# Patient Record
Sex: Male | Born: 1950 | Race: White | Hispanic: No | Marital: Single | Smoking: Light tobacco smoker
Health system: Southern US, Community
[De-identification: ages and names within clinical notes are randomized; demographics above are authoritative.]

## PROBLEM LIST (undated history)

## (undated) DIAGNOSIS — I639 Cerebral infarction, unspecified: Secondary | ICD-10-CM

## (undated) DIAGNOSIS — R42 Dizziness and giddiness: Secondary | ICD-10-CM

## (undated) DIAGNOSIS — I714 Abdominal aortic aneurysm, without rupture, unspecified: Secondary | ICD-10-CM

## (undated) DIAGNOSIS — G459 Transient cerebral ischemic attack, unspecified: Secondary | ICD-10-CM

---

## 2002-04-03 ENCOUNTER — Emergency Department (HOSPITAL_COMMUNITY): Admission: EM | Admit: 2002-04-03 | Discharge: 2002-04-03 | Payer: Self-pay | Admitting: Emergency Medicine

## 2002-04-03 ENCOUNTER — Encounter: Payer: Self-pay | Admitting: Internal Medicine

## 2002-04-03 ENCOUNTER — Encounter: Payer: Self-pay | Admitting: Emergency Medicine

## 2002-04-04 ENCOUNTER — Inpatient Hospital Stay (HOSPITAL_COMMUNITY): Admission: EM | Admit: 2002-04-04 | Discharge: 2002-04-05 | Payer: Self-pay | Admitting: Emergency Medicine

## 2002-04-04 ENCOUNTER — Encounter: Payer: Self-pay | Admitting: Internal Medicine

## 2002-05-27 ENCOUNTER — Encounter: Admission: RE | Admit: 2002-05-27 | Discharge: 2002-06-18 | Payer: Self-pay | Admitting: Internal Medicine

## 2017-08-23 ENCOUNTER — Other Ambulatory Visit: Payer: Self-pay

## 2017-08-23 NOTE — Patient Outreach (Signed)
[facility]) Care Management  08/23/2017  Isaac KaufmannBobby R Stokes 1951-02-03 [id number]   Telephone Screen  Referral Date: 08/23/17 Referral Source: Episource-HTA Referral Reason: "smoking, lack of knowledge about conditions, care coordination needed, member needs several referrals as he has not seen a doctor in years"  Insurance: HTA   Outreach attempt # 1 to patient. Main number listed for patient a male answered and reported wrong number and wished for number to be taken off contact info. He voices that he gets a lot of calls asking for patient and he does not know who patient is. RN CM attempted call to alternate number listed and no answer. RN CM left HIPAA compliant voicemail message along with contact info.      Plan: RN CM will notify [facility] administrative [facility] to update demographics. RN CM will make outreach attempt to patient within three business days if no return call.    Antionette Fairyoshanda Yoali Conry, RN,BSN,CCM [facility] Care [facility] Telephonic Care Management Coordinator Direct Phone: [phone] Toll Free: [phone] Fax: [phone]

## 2017-08-25 ENCOUNTER — Other Ambulatory Visit: Payer: Self-pay

## 2017-08-25 NOTE — Patient Outreach (Signed)
[facility]) Care Management  08/25/2017  Efraim KaufmannBobby R Closson [birth year] [id number]   Telephone Screen  Referral Date: 08/23/17 Referral Source: Episource-HTA Referral Reason: "smoking, lack of knowledge about conditions, care coordination needed, member needs several referrals as he has not seen a doctor in years"  Insurance: HTA   Outreach attempt #2 to patient. No answer at present.    Plan: RN CM will make outreach attempt to patient within three business days.   Antionette Fairyoshanda Derrica Sieg, RN,BSN,CCM [facility] Care Management Telephonic Care Management Coordinator Direct Phone: [phone] Toll Free: [phone] Fax: [phone]

## 2017-08-28 ENCOUNTER — Other Ambulatory Visit: Payer: Self-pay

## 2017-08-28 NOTE — Patient Outreach (Signed)
[facility]) Care Management  08/28/2017  Isaac KaufmannBobby R Stokes November 23, 1950 [id number]   Telephone Screen  Referral Date:08/23/17 Referral Source:Episource-HTA Referral Reason:"smoking, lack of knowledge about conditions, care coordination needed, member needs several referrals as he has not seen a doctor in years" Insurance:HTA  Outreach attempt # 3 to patient. Main number remains out of services. RN CM attempted alternate and no answer at present and voicemail message left.       Plan: RN CM will send unsuccessful outreach letter to patient and close case if no response within 10 business days.    Antionette Fairyoshanda Gradie Ohm, RN,BSN,CCM [facility] Care [facility] Telephonic Care Management Coordinator Direct Phone: [phone] Toll Free: [phone] Fax: [phone]

## 2017-08-31 ENCOUNTER — Other Ambulatory Visit: Payer: Self-pay

## 2017-08-31 NOTE — Patient Outreach (Signed)
[facility]) Care Management  08/31/2017  Isaac KaufmannBobby R Stokes 20-Sep-1950 [id number]   Telephone Screen  Referral Date:08/23/17 Referral Source:Episource-HTA Referral Reason:"smoking, lack of knowledge about conditions, care coordination needed, member needs several referrals as he has not seen a doctor in years" Insurance:HTA   Voicemail message received from [name](apartment complex coordinator where patient resides) calling for patient and requesting return call to patient. RN CM placed return call. No answer at present. RN CM left HIPAA compliant voicemail message along with contact info and advised Kerry FortVickey that due to HIPAA reasons RN CM unable to disclose any info to her without verbal consent from pt.     Plan: RN CM will await return call from patient. If no response will close case as unsuccessful letter already sent to patient.    Antionette Fairyoshanda Rex Oesterle, RN,BSN,CCM [facility] Care Management Telephonic Care Management Coordinator Direct Phone: [phone] Toll Free: [phone] Fax: [phone]

## 2017-09-01 ENCOUNTER — Other Ambulatory Visit: Payer: Self-pay

## 2017-09-01 NOTE — Patient Outreach (Signed)
[facility]) Care Management  09/01/2017  Isaac KaufmannBobby R Stokes [birth year] [id number]   Telephone Screen  Referral Date:08/23/17 Referral Source:Episource-HTA Referral Reason:"smoking, lack of knowledge about conditions, care coordination needed, member needs several referrals as he has not seen a doctor in years" Insurance:HTA   Voicemail message received from pt/ apartment coordinator-Isaac Stokes. She voices that patient was currently in the room with her and able to complete screening call with RNCM. Return call placed and no answer at present.     Plan: RN CM has already made more than three outreach attempts to establish contact with patient and sent unable to reach letter. Case closure pending.   Antionette Fairyoshanda Lakina Mcintire, RN,BSN,CCM [facility] Care Management Telephonic Care Management Coordinator Direct Phone: [phone] Toll Free: [phone] Fax: [phone]

## 2017-09-11 ENCOUNTER — Other Ambulatory Visit: Payer: Self-pay

## 2017-09-11 NOTE — Patient Outreach (Signed)
[facility]) Care Management  09/11/2017  Efraim KaufmannBobby R Harwick [birth year] [id number]   Telephone Screen  Referral Date:08/23/17 Referral Source:Episource-HTA Referral Reason:"smoking, lack of knowledge about conditions, care coordination needed, member needs several referrals as he has not seen a doctor in years" Insurance:HTA     Multiple attempts to establish contact with patient without success. No response from letter mailed to patient. Case is being closed at this time.    Plan: RN CM will notify [facility] administrative assistant of case status. RN CM unable to send MD letter as no PCP listed on file.    Antionette Fairyoshanda Madelynn Malson, RN,BSN,CCM [facility] Care Management Telephonic Care Management Coordinator Direct Phone: [phone] Toll Free: [phone] Fax: [phone]

## 2019-03-27 ENCOUNTER — Other Ambulatory Visit: Payer: Self-pay

## 2019-07-29 ENCOUNTER — Other Ambulatory Visit: Payer: Self-pay

## 2019-07-29 DIAGNOSIS — Z20822 Contact with and (suspected) exposure to covid-19: Secondary | ICD-10-CM

## 2019-07-30 LAB — NOVEL CORONAVIRUS, NAA: SARS-CoV-2, NAA: NOT DETECTED

## 2019-08-08 DIAGNOSIS — J301 Allergic rhinitis due to pollen: Secondary | ICD-10-CM | POA: Diagnosis not present

## 2019-08-08 DIAGNOSIS — F338 Other recurrent depressive disorders: Secondary | ICD-10-CM | POA: Diagnosis not present

## 2020-12-15 ENCOUNTER — Other Ambulatory Visit: Payer: Self-pay

## 2020-12-15 ENCOUNTER — Ambulatory Visit (INDEPENDENT_AMBULATORY_CARE_PROVIDER_SITE_OTHER): Payer: Medicare Other | Admitting: Podiatry

## 2020-12-15 ENCOUNTER — Ambulatory Visit (INDEPENDENT_AMBULATORY_CARE_PROVIDER_SITE_OTHER): Payer: Medicare Other

## 2020-12-15 DIAGNOSIS — M21611 Bunion of right foot: Secondary | ICD-10-CM | POA: Diagnosis not present

## 2020-12-15 DIAGNOSIS — M21619 Bunion of unspecified foot: Secondary | ICD-10-CM

## 2020-12-16 ENCOUNTER — Telehealth: Payer: Self-pay | Admitting: *Deleted

## 2020-12-16 NOTE — Telephone Encounter (Signed)
Patient is calling for the status of a medication that was suppose to be sent to pharmacy [facility]), is there waiting but they do not have. Notes not available. Please advise.

## 2020-12-17 MED ORDER — CICLOPIROX 8 % EX SOLN: Freq: Every day | CUTANEOUS | 2 refills | Status: DC

## 2020-12-17 NOTE — Telephone Encounter (Signed)
Sent Penlac

## 2020-12-17 NOTE — Telephone Encounter (Signed)
Informed patient that prescription had been sent to pharmacy on file,verbalized understanding.

## 2020-12-17 NOTE — Telephone Encounter (Signed)
Returned call to inform that prescription had been sent to pharmacy, no answer and could not leave message.

## 2020-12-20 NOTE — Progress Notes (Signed)
Subjective:   Patient ID: Isaac Stokes, male   DOB: 70 y.o.   MRN: [id number]   HPI 70 year old male presents the office today with concerns of a bunion as well as thick, discolored toenails that he cannot trim himself and is asking for treatment options for nail fungus.  He states that he has had a bunion for greater than 40 years he was told previously the bunion cannot be removed and also got infected.  Causes minimal discomfort.  He said no recent treatment for either issue.  He has no other concerns today.  Review of Systems  All other systems reviewed and are negative.  No past medical history on file.    Current Outpatient Medications:  .  ciclopirox (PENLAC) 8 % solution, Apply topically at bedtime. Apply over nail and surrounding skin. Apply daily over previous coat. After seven (7) days, may remove with alcohol and continue cycle., Disp: 6.6 mL, Rfl: 2  Not on File        Objective:  Physical Exam  General: AAO x3, NAD  Dermatological: Nails are hypertrophic, dystrophic, brittle, discolored, elongated 10. No surrounding redness or drainage. Tenderness nails 1-5 bilaterally. No open lesions or pre-ulcerative lesions are identified today.  Vascular: Dorsalis Pedis artery and Posterior Tibial artery pedal pulses are 2/4 bilateral with immedate capillary fill time. There is no pain with calf compression, swelling, warmth, erythema.   Neruologic: Grossly intact via light touch bilateral.    Musculoskeletal: Significant bunion is present on the right side.  Mild erythema more irritating.  She has been there is no skin breakdown, warmth or any signs of infection.  No pain with MPJ range of motion.  No significant tenderness palpation on the bunion area.  Muscular strength 5/5 in all groups tested bilateral.  Gait: Unassisted, Nonantalgic.       Assessment:   70 year old male with bunion, symptomatic onychomycosis    Plan:  -Treatment options discussed including all  alternatives, risks, and complications -Etiology of symptoms were discussed  1.  Bunion -X-rays obtained and reviewed.  No evidence of acute fracture.  Significant bunion is present.  Arthritic changes present the first MPJ. -Discussed with conservative as well as surgical options.  Recommended continue with conservative treatment.  Discussed offloading pads which were dispensed today as well as shoe modifications.  2.  Symptomatic onychomycosis -Sharply debrided the nails x10 without any complications or bleeding -Penlac  Return for nail removal in 3-4 weeks and wound check .  Vivi Barrack DPM

## 2021-01-26 ENCOUNTER — Emergency Department (HOSPITAL_COMMUNITY): Payer: Medicare Other

## 2021-01-26 ENCOUNTER — Inpatient Hospital Stay (HOSPITAL_COMMUNITY)
Admission: EM | Admit: 2021-01-26 | Discharge: 2021-01-28 | DRG: 065 | Disposition: A | Discharge status: Home Health | Payer: Medicare Other | Attending: Family Medicine | Admitting: Family Medicine

## 2021-01-26 ENCOUNTER — Encounter (HOSPITAL_COMMUNITY): Payer: Self-pay | Admitting: Emergency Medicine

## 2021-01-26 ENCOUNTER — Other Ambulatory Visit: Payer: Self-pay

## 2021-01-26 DIAGNOSIS — R29704 NIHSS score 4: Secondary | ICD-10-CM | POA: Diagnosis present

## 2021-01-26 DIAGNOSIS — R270 Ataxia, unspecified: Secondary | ICD-10-CM | POA: Diagnosis present

## 2021-01-26 DIAGNOSIS — G8194 Hemiplegia, unspecified affecting left nondominant side: Secondary | ICD-10-CM | POA: Diagnosis present

## 2021-01-26 DIAGNOSIS — Z720 Tobacco use: Secondary | ICD-10-CM

## 2021-01-26 DIAGNOSIS — Z8041 Family history of malignant neoplasm of ovary: Secondary | ICD-10-CM | POA: Diagnosis not present

## 2021-01-26 DIAGNOSIS — Z20822 Contact with and (suspected) exposure to covid-19: Secondary | ICD-10-CM | POA: Diagnosis present

## 2021-01-26 DIAGNOSIS — R631 Polydipsia: Secondary | ICD-10-CM | POA: Diagnosis present

## 2021-01-26 DIAGNOSIS — Z823 Family history of stroke: Secondary | ICD-10-CM | POA: Diagnosis not present

## 2021-01-26 DIAGNOSIS — Z7982 Long term (current) use of aspirin: Secondary | ICD-10-CM

## 2021-01-26 DIAGNOSIS — I6389 Other cerebral infarction: Secondary | ICD-10-CM | POA: Diagnosis not present

## 2021-01-26 DIAGNOSIS — Z8673 Personal history of transient ischemic attack (TIA), and cerebral infarction without residual deficits: Secondary | ICD-10-CM | POA: Diagnosis not present

## 2021-01-26 DIAGNOSIS — I639 Cerebral infarction, unspecified: Secondary | ICD-10-CM | POA: Diagnosis not present

## 2021-01-26 DIAGNOSIS — F17210 Nicotine dependence, cigarettes, uncomplicated: Secondary | ICD-10-CM | POA: Diagnosis present

## 2021-01-26 DIAGNOSIS — R3589 Other polyuria: Secondary | ICD-10-CM

## 2021-01-26 DIAGNOSIS — R202 Paresthesia of skin: Secondary | ICD-10-CM | POA: Diagnosis not present

## 2021-01-26 DIAGNOSIS — I6381 Other cerebral infarction due to occlusion or stenosis of small artery: Secondary | ICD-10-CM

## 2021-01-26 DIAGNOSIS — N289 Disorder of kidney and ureter, unspecified: Secondary | ICD-10-CM | POA: Diagnosis present

## 2021-01-26 DIAGNOSIS — R079 Chest pain, unspecified: Secondary | ICD-10-CM

## 2021-01-26 HISTORY — DX: Transient cerebral ischemic attack, unspecified: G45.9

## 2021-01-26 LAB — DIFFERENTIAL
Abs Immature Granulocytes: 0.04 10*3/uL (ref 0.00–0.07)
Basophils Absolute: 0.1 10*3/uL (ref 0.0–0.1)
Basophils Relative: 1 %
Eosinophils Absolute: 0.1 10*3/uL (ref 0.0–0.5)
Eosinophils Relative: 2 %
Immature Granulocytes: 1 %
Lymphocytes Relative: 25 %
Lymphs Abs: 1.7 10*3/uL (ref 0.7–4.0)
Monocytes Absolute: 0.5 10*3/uL (ref 0.1–1.0)
Monocytes Relative: 7 %
Neutro Abs: 4.5 10*3/uL (ref 1.7–7.7)
Neutrophils Relative %: 64 %

## 2021-01-26 LAB — CBC
HCT: 40.4 % (ref 39.0–52.0)
Hemoglobin: 12.8 g/dL — ABNORMAL LOW (ref 13.0–17.0)
MCH: 30.5 pg (ref 26.0–34.0)
MCHC: 31.7 g/dL (ref 30.0–36.0)
MCV: 96.4 fL (ref 80.0–100.0)
Platelets: 239 10*3/uL (ref 150–400)
RBC: 4.19 MIL/uL — ABNORMAL LOW (ref 4.22–5.81)
RDW: 13.4 % (ref 11.5–15.5)
WBC: 6.9 10*3/uL (ref 4.0–10.5)
nRBC: 0 % (ref 0.0–0.2)

## 2021-01-26 LAB — COMPREHENSIVE METABOLIC PANEL
ALT: 15 U/L (ref 0–44)
AST: 20 U/L (ref 15–41)
Albumin: 3.8 g/dL (ref 3.5–5.0)
Alkaline Phosphatase: 45 U/L (ref 38–126)
Anion gap: 9 (ref 5–15)
BUN: 28 mg/dL — ABNORMAL HIGH (ref 8–23)
CO2: 22 mmol/L (ref 22–32)
Calcium: 9 mg/dL (ref 8.9–10.3)
Chloride: 107 mmol/L (ref 98–111)
Creatinine, Ser: 1.7 mg/dL — ABNORMAL HIGH (ref 0.61–1.24)
GFR, Estimated: 43 mL/min — ABNORMAL LOW (ref >60)
Glucose, Bld: 105 mg/dL — ABNORMAL HIGH (ref 70–99)
Potassium: 4.2 mmol/L (ref 3.5–5.1)
Sodium: 138 mmol/L (ref 135–145)
Total Bilirubin: 0.5 mg/dL (ref 0.3–1.2)
Total Protein: 7.2 g/dL (ref 6.5–8.1)

## 2021-01-26 LAB — I-STAT CHEM 8, ED
BUN: 29 mg/dL — ABNORMAL HIGH (ref 8–23)
Calcium, Ion: 1.22 mmol/L (ref 1.15–1.40)
Chloride: 109 mmol/L (ref 98–111)
Creatinine, Ser: 1.7 mg/dL — ABNORMAL HIGH (ref 0.61–1.24)
Glucose, Bld: 102 mg/dL — ABNORMAL HIGH (ref 70–99)
HCT: 38 % — ABNORMAL LOW (ref 39.0–52.0)
Hemoglobin: 12.9 g/dL — ABNORMAL LOW (ref 13.0–17.0)
Potassium: 4.2 mmol/L (ref 3.5–5.1)
Sodium: 140 mmol/L (ref 135–145)
TCO2: 22 mmol/L (ref 22–32)

## 2021-01-26 LAB — RESP PANEL BY RT-PCR (FLU A&B, COVID) ARPGX2
Influenza A by PCR: NEGATIVE
Influenza B by PCR: NEGATIVE
SARS Coronavirus 2 by RT PCR: NEGATIVE

## 2021-01-26 LAB — PROTIME-INR
INR: 1.1 (ref 0.8–1.2)
Prothrombin Time: 13.7 seconds (ref 11.4–15.2)

## 2021-01-26 LAB — CBG MONITORING, ED: Glucose-Capillary: 92 mg/dL (ref 70–99)

## 2021-01-26 LAB — APTT: aPTT: 30 seconds (ref 24–36)

## 2021-01-26 IMAGING — MR MR HEAD W/O CM
10 of 11 series · 43 of 48 positions shown · non-contrast
Comparison: No pertinent prior exam.
COMPARISON: No pertinent prior exam.

CLINICAL DATA: Left-sided tingling and numbness

EXAM:
MRI HEAD WITHOUT CONTRAST
MRA HEAD WITHOUT CONTRAST
TECHNIQUE: Multiplanar, multi-echo pulse sequences of the brain and surrounding
structures were acquired without intravenous contrast. Angiographic
images of the Circle of Willis were acquired using MRA technique
without intravenous contrast.

[Series 5: DWI · axial · 3.0mm · 0.88mm/px · z∈[-63,+86]mm · 9 of 108 slices shown (1 of 4)]
[im 1/108]
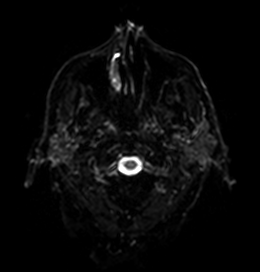
[im 14/108]
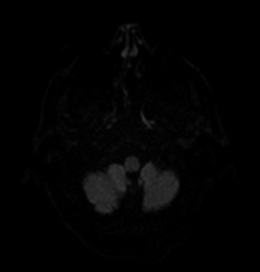
[im 27/108]
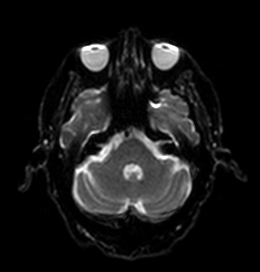
[im 41/108]
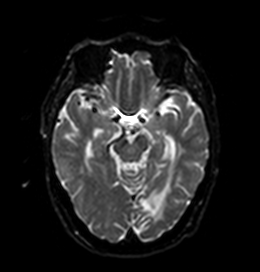
[im 54/108]
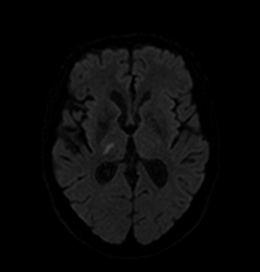
[im 67/108]
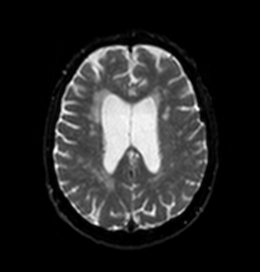
[im 81/108]
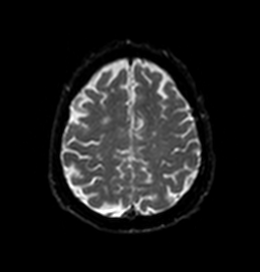
[im 94/108]
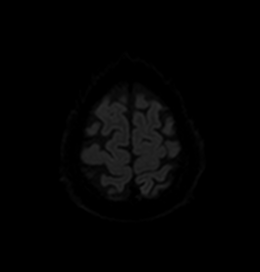
[im 108/108]
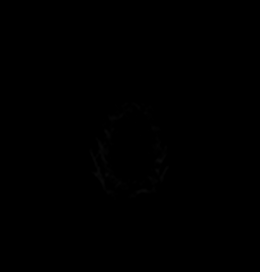

[Series 6: DWI · axial · 3.0mm · 0.88mm/px · z∈[-63,+86]mm · 5 of 54 slices shown (2 of 4)]
[im 1/54]
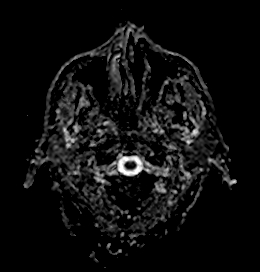
[im 14/54]
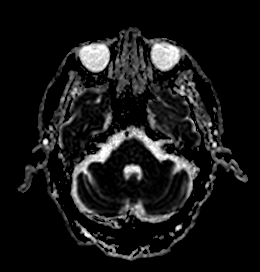
[im 27/54]
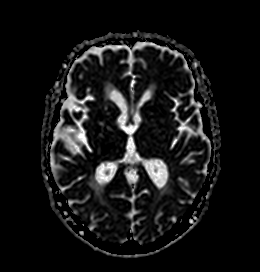
[im 40/54]
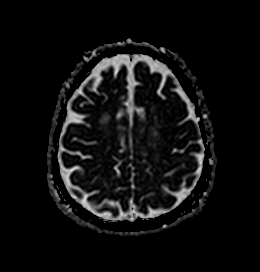
[im 54/54]
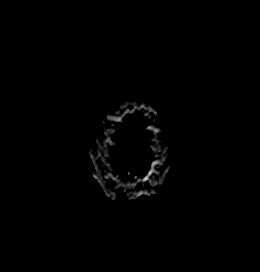

[Series 7: DWI · coronal · 4.0mm · 0.88mm/px · 7 of 76 slices shown (3 of 4)]
[im 1/76]
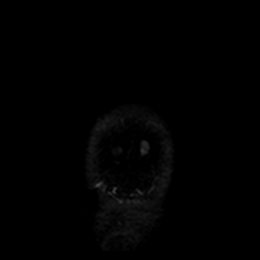
[im 13/76]
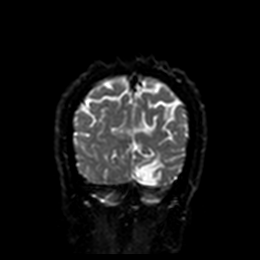
[im 26/76]
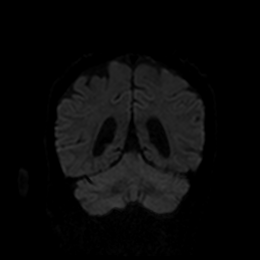
[im 38/76]
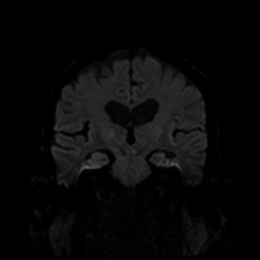
[im 51/76]
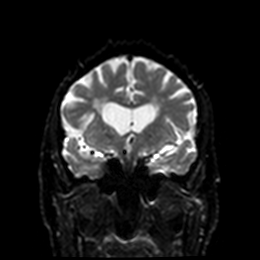
[im 63/76]
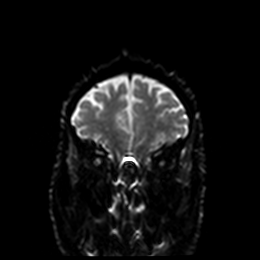
[im 76/76]
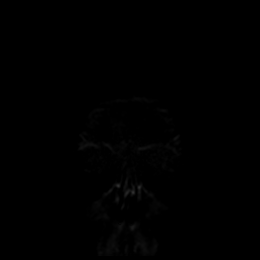

[Series 8: DWI · coronal · 4.0mm · 0.88mm/px · 3 of 38 slices shown (4 of 4)]
[im 1/38]
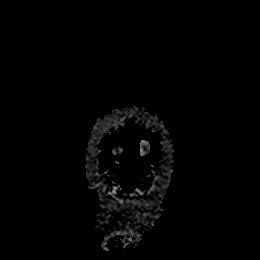
[im 19/38]
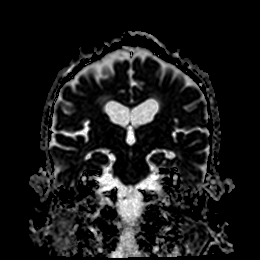
[im 38/38]
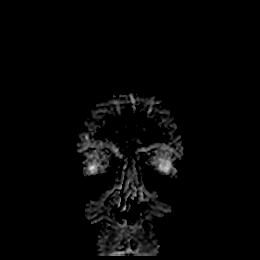

[Series 9: FLAIR · axial · 5.0mm · 0.47mm/px · z∈[-55,+85]mm · 2 of 26 slices shown]
[im 1/26]
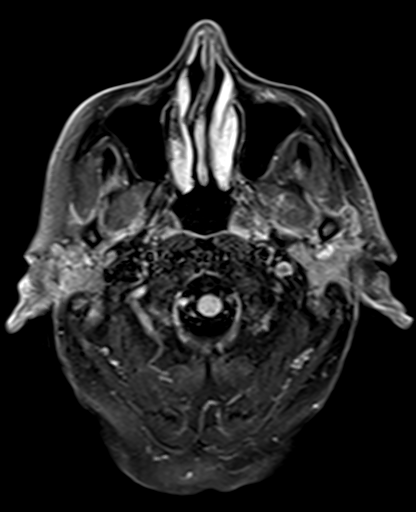
[im 26/26]
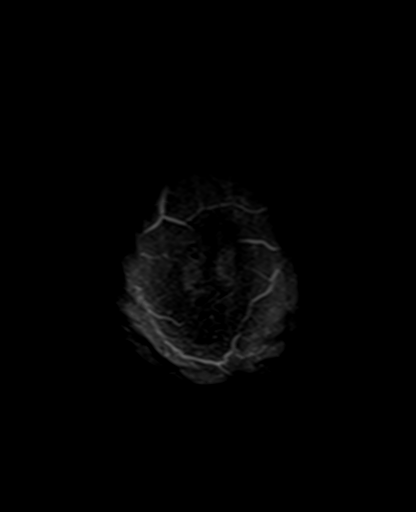

[Series 15: pha_images · axial · 3.0mm · 0.94mm/px · z∈[-63,+92]mm · 5 of 56 slices shown]
[im 1/56]
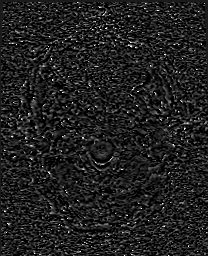
[im 14/56]
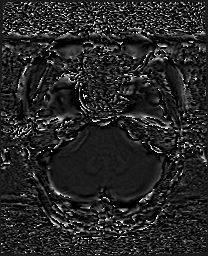
[im 28/56]
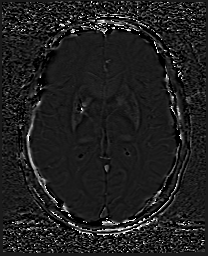
[im 42/56]
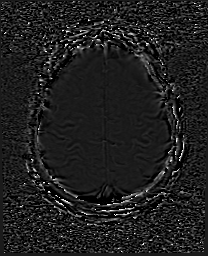
[im 56/56]
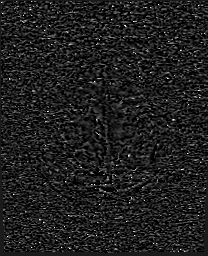

[Series 16: swi_images · axial · 3.0mm · 0.94mm/px · z∈[-63,+92]mm · 5 of 56 slices shown]
[im 1/56]
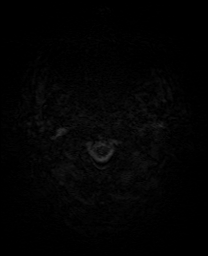
[im 14/56]
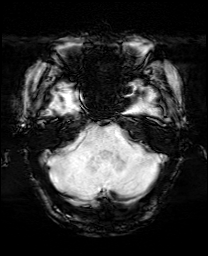
[im 28/56]
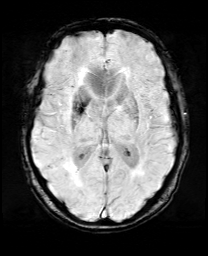
[im 42/56]
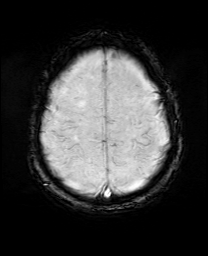
[im 56/56]
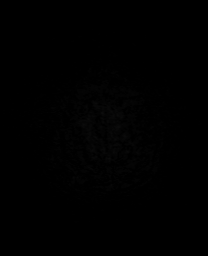

[Series 18: T1 · sagittal · 5.0mm · 0.75mm/px · 2 of 25 slices shown]
[im 1/25]
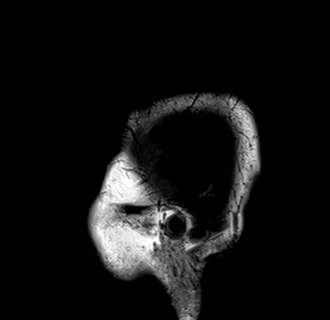
[im 25/25]
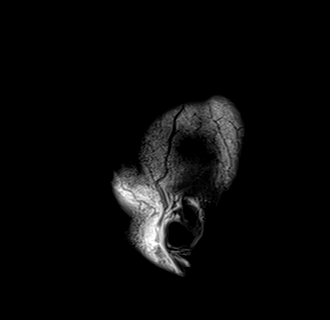

[Series 19: T2 · axial · 5.0mm · 0.75mm/px · z∈[-56,+84]mm · 2 of 26 slices shown (1 of 2)]
[im 1/26]
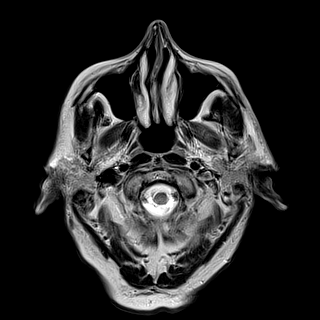
[im 26/26]
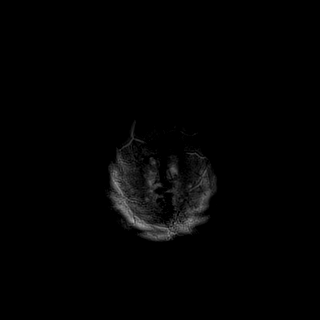

[Series 21: T2 · coronal · 5.0mm · 0.34mm/px · 3 of 32 slices shown (2 of 2)]
[im 1/32]
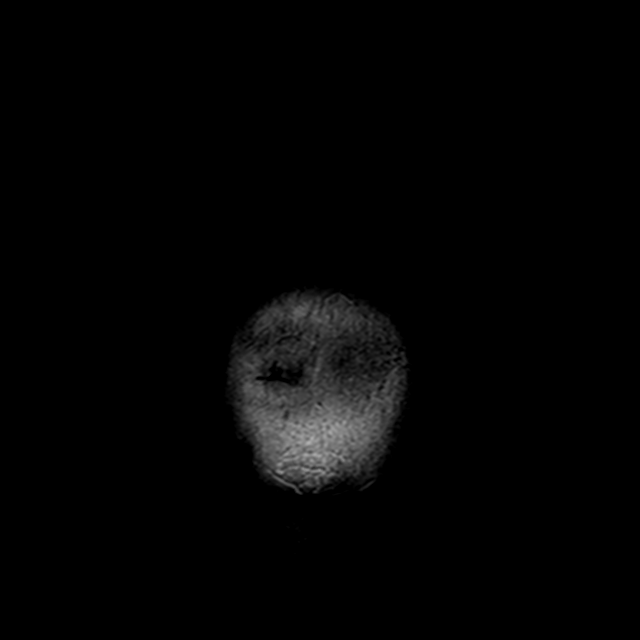
[im 16/32]
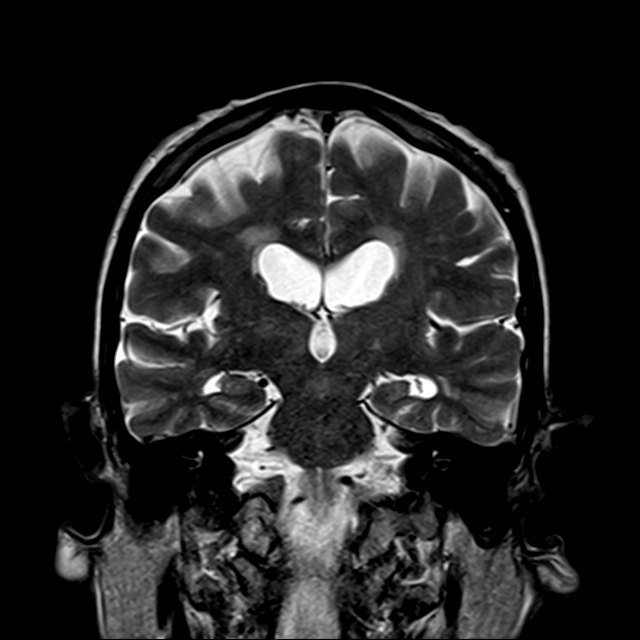
[im 32/32]
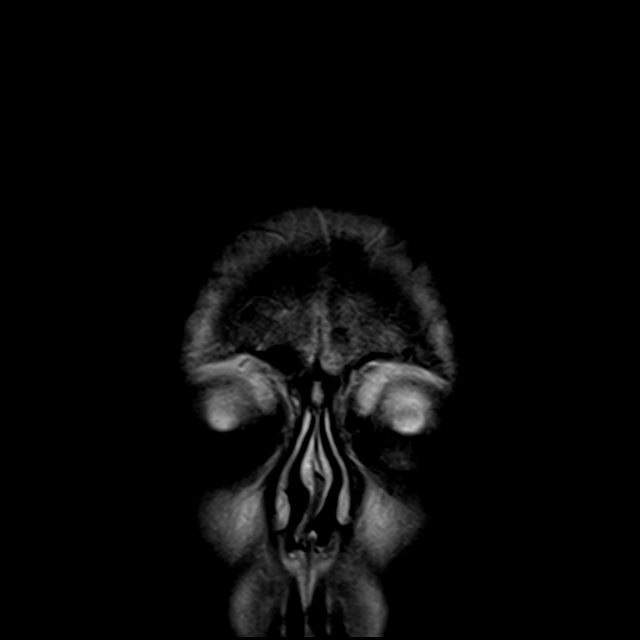

[43 of 48 positions shown; findings below may reference images not displayed]

FINDINGS: MRI HEAD FINDINGS

Brain: Small acute infarct of the lateral right thalamus. No acute
or chronic hemorrhage. There is multifocal hyperintense T2-weighted
signal within the white matter. Generalized volume loss without a
clear lobar predilection. Old left occipital infarct. The midline
structures are normal.

Vascular: Major flow voids are preserved.

Skull and upper cervical spine: Normal calvarium and skull base.
Visualized upper cervical spine and soft tissues are normal.

Sinuses/Orbits:No paranasal sinus fluid levels or advanced mucosal
thickening. No mastoid or middle ear effusion. Normal orbits.

MRA HEAD FINDINGS

POSTERIOR CIRCULATION:

--Vertebral arteries: Normal

--Inferior cerebellar arteries: Normal.

--Basilar artery: Normal.

--Superior cerebellar arteries: Normal.

--Posterior cerebral arteries: Left PCA occlusion at the P1-2
junction. Normal right PCA.

ANTERIOR CIRCULATION:

--Intracranial internal carotid arteries: Normal.

--Anterior cerebral arteries (ACA): Normal.

--Middle cerebral arteries (MCA): Normal.

ANATOMIC VARIANTS: None
IMPRESSION: 1. Small acute infarct of the lateral right thalamus. No hemorrhage
or mass effect.
2. Left PCA occlusion at the P1-2 junction with old left occipital
infarct.
3. Chronic small vessel ischemic changes and atrophy.

## 2021-01-26 IMAGING — CT CT HEAD W/O CM
4 series · 15 of 47 positions shown, 17 images · non-contrast
Comparison: None.

CLINICAL DATA: Weakness

EXAM:
CT HEAD WITHOUT CONTRAST
TECHNIQUE: Contiguous axial images were obtained from the base of the skull
through the vertex without intravenous contrast.

[Series 3: head without · axial · non-contrast · 0.45mm/px · z∈[-166,-26]mm · 7 of 38 slices shown, 9 images]
[im 5/38  brain]
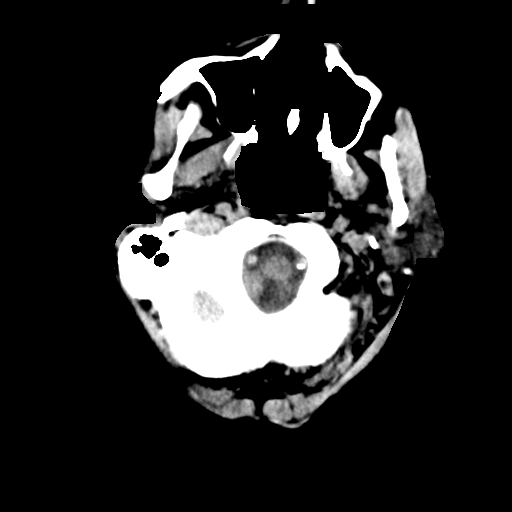
[im 5/38  bone]
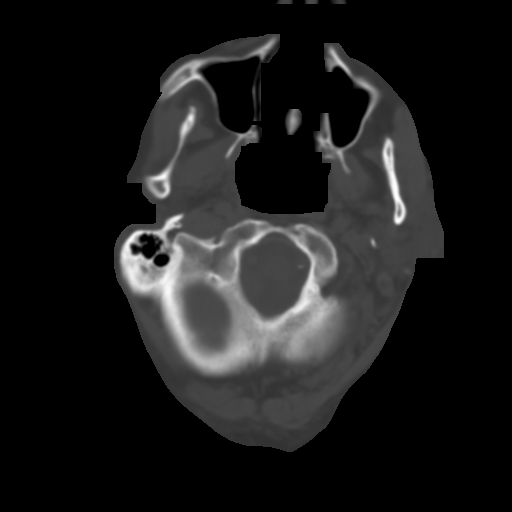
[im 10/38  brain]
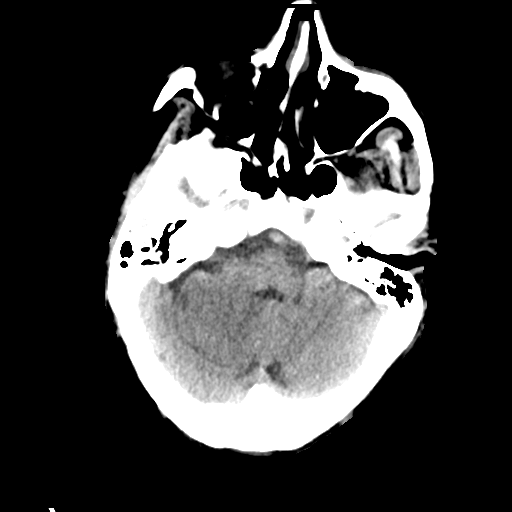
[im 14/38  brain]
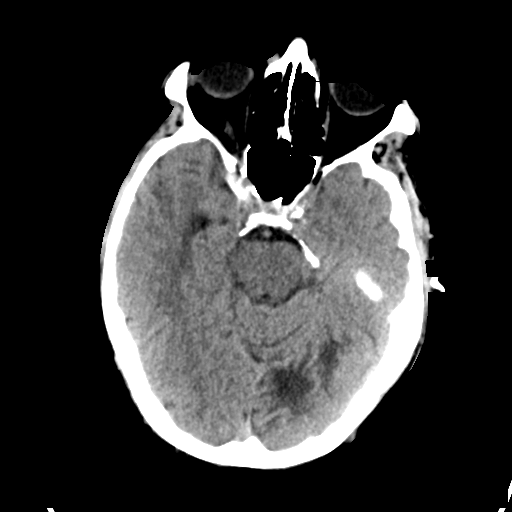
[im 19/38  brain]
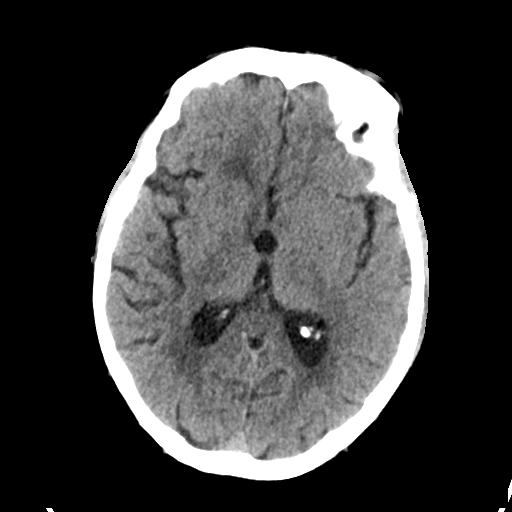
[im 24/38  brain]
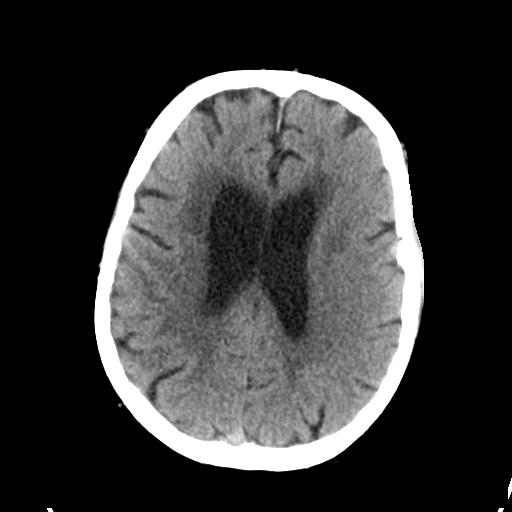
[im 24/38  bone]
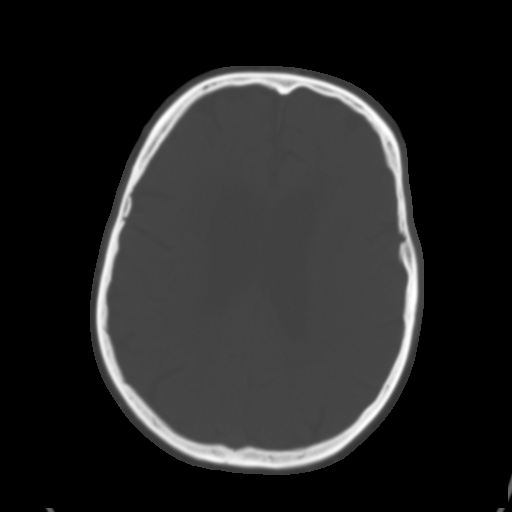
[im 28/38  brain]
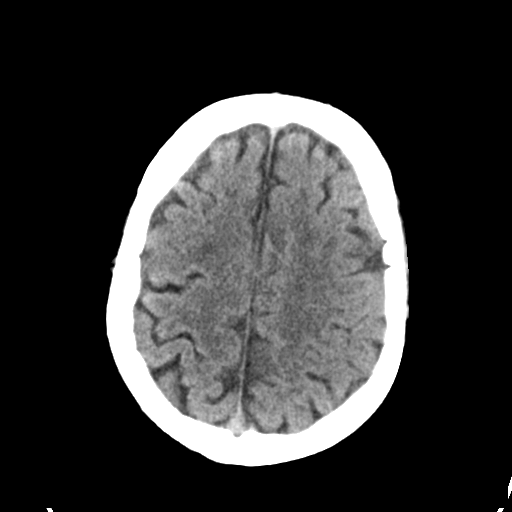
[im 33/38  brain]
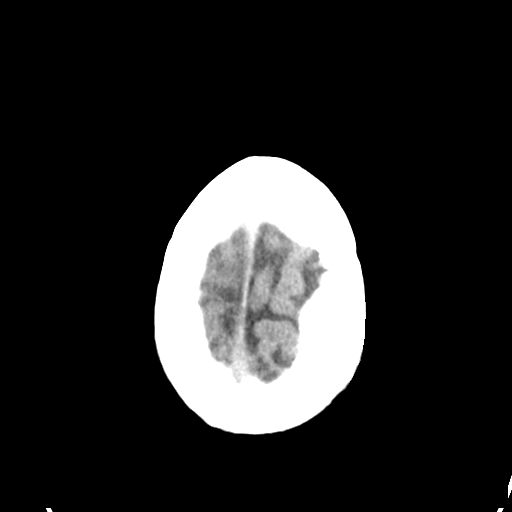

[Series 4: head bone · axial · 0.45mm/px · z∈[-168,-150]mm · 2 of 94 slices shown]
[im 10/94  bone]
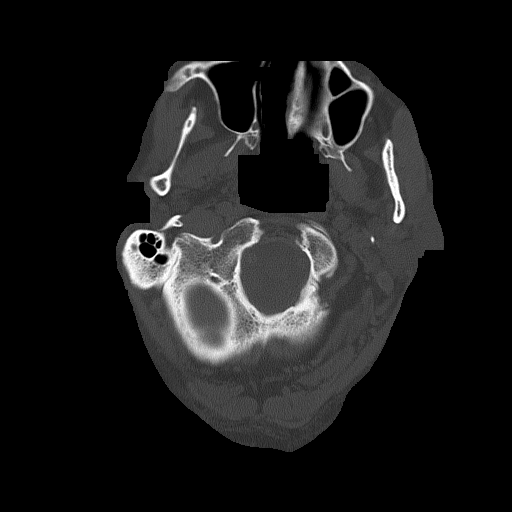
[im 19/94  bone]
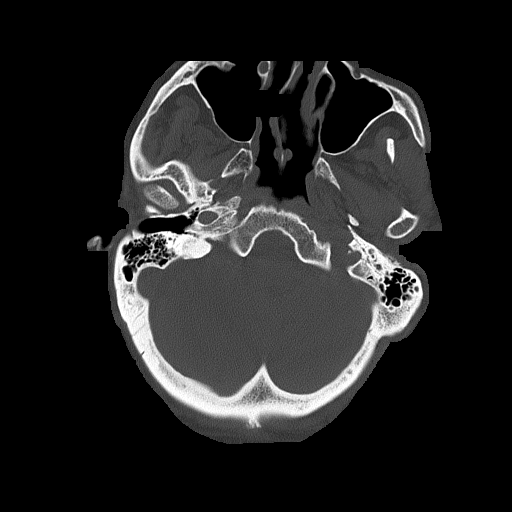

[Series 5: head without cor · coronal · non-contrast · 0.39mm/px · 3 of 73 slices shown]
[im 25/73  brain]
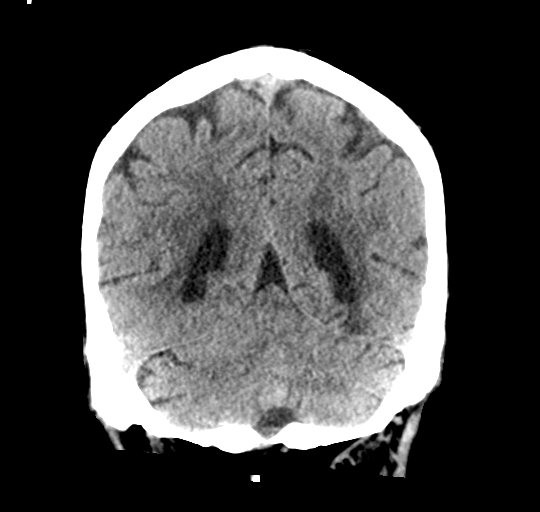
[im 33/73  brain]
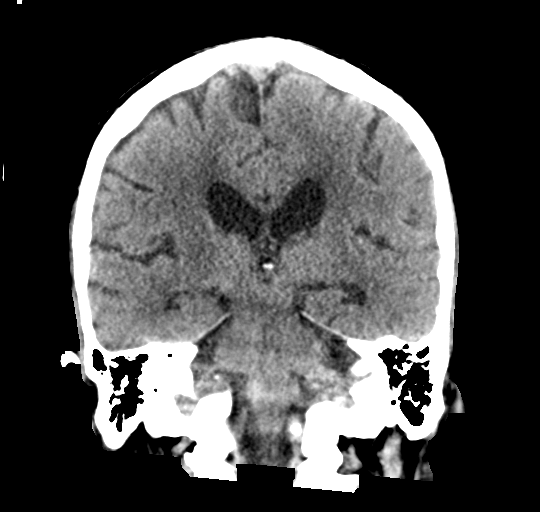
[im 41/73  brain]
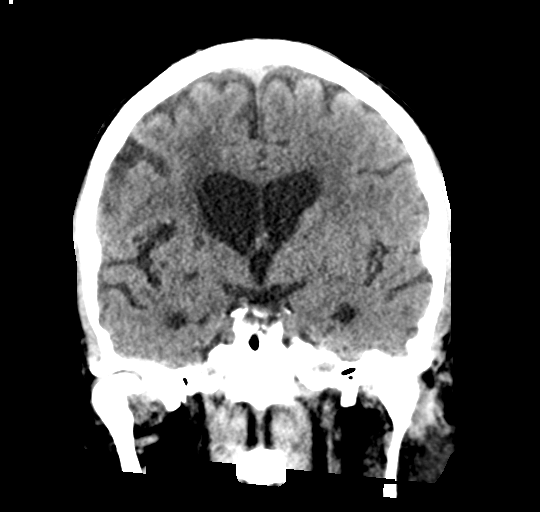

[Series 6: head without sag · sagittal · non-contrast · 0.37mm/px · 3 of 67 slices shown]
[im 23/67  brain]
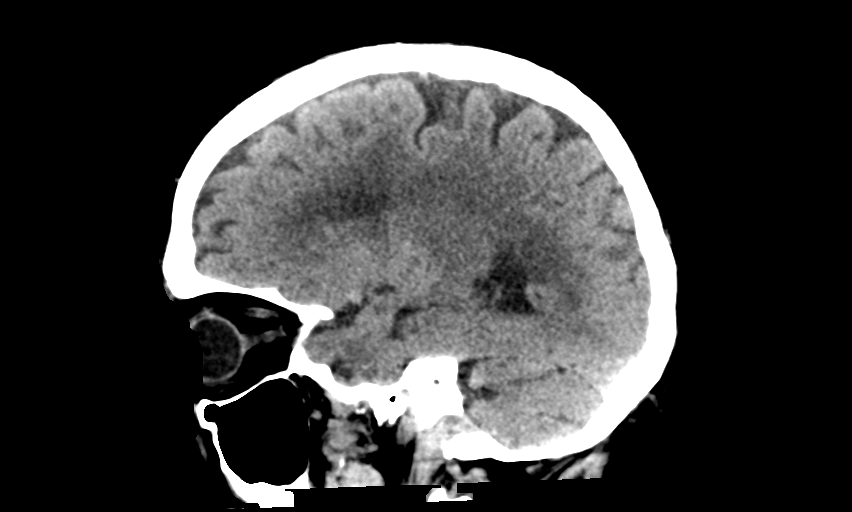
[im 34/67  brain]
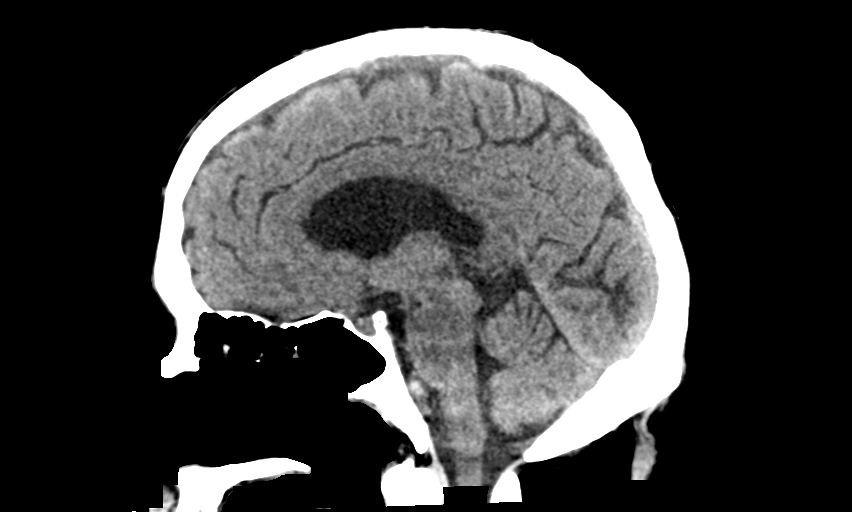
[im 45/67  brain]
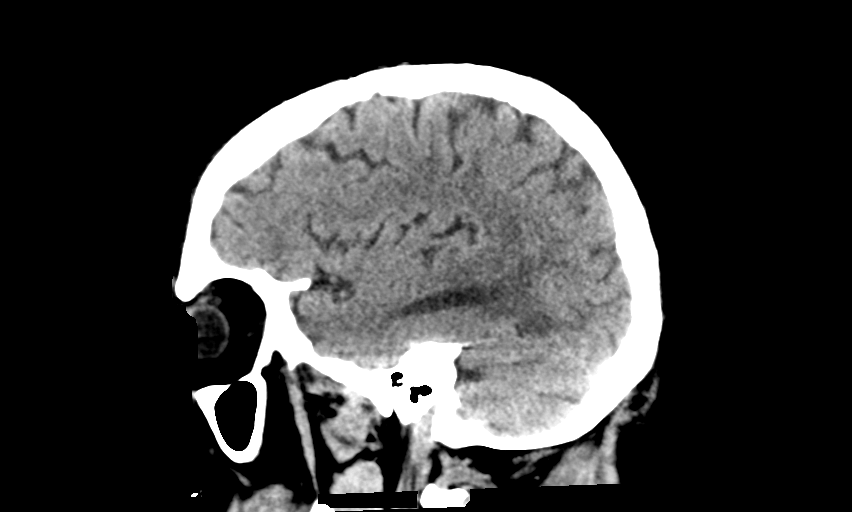

[15 of 47 positions shown; findings below may reference images not displayed]

FINDINGS: Brain: No evidence of acute infarction, hemorrhage, hydrocephalus,
extra-axial collection or mass lesion/mass effect. Chronic white
matter ischemic change is seen. Additionally prior infarcts are
noted in the anterior limb of the internal capsule on the right as
well as in the left occipital lobe

Vascular: No hyperdense vessel or unexpected calcification.

Skull: Normal. Negative for fracture or focal lesion.

Sinuses/Orbits: No acute finding.

Other: None.
IMPRESSION: Chronic ischemic changes are noted without acute abnormality.

## 2021-01-26 IMAGING — MR MR MRA HEAD W/O CM
1 series · 24 of 48 positions shown · non-contrast
Comparison: No pertinent prior exam.
COMPARISON: No pertinent prior exam.

CLINICAL DATA: Left-sided tingling and numbness

EXAM:
MRI HEAD WITHOUT CONTRAST
MRA HEAD WITHOUT CONTRAST
TECHNIQUE: Multiplanar, multi-echo pulse sequences of the brain and surrounding
structures were acquired without intravenous contrast. Angiographic
images of the Circle of Willis were acquired using MRA technique
without intravenous contrast.

[Series 10: 3d cow · axial · 0.5mm · 0.43mm/px · z∈[-67,+18]mm · 24 of 188 slices shown]
[im 1/188]
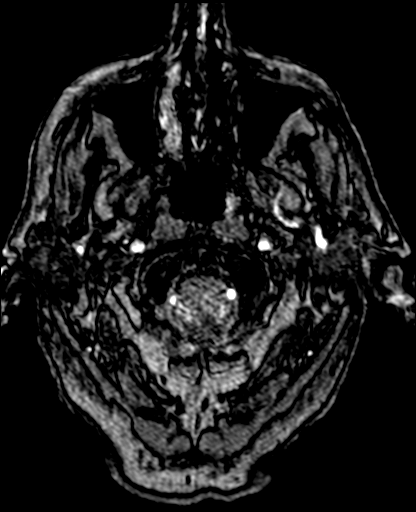
[im 4/188]
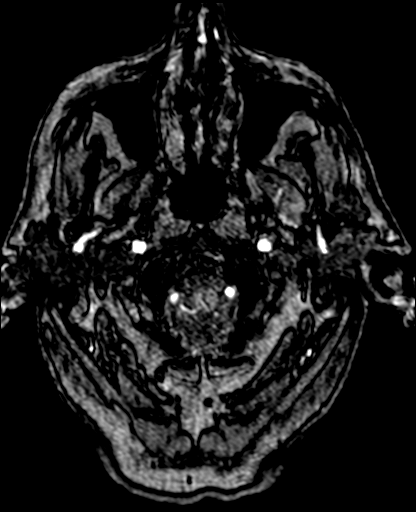
[im 8/188]
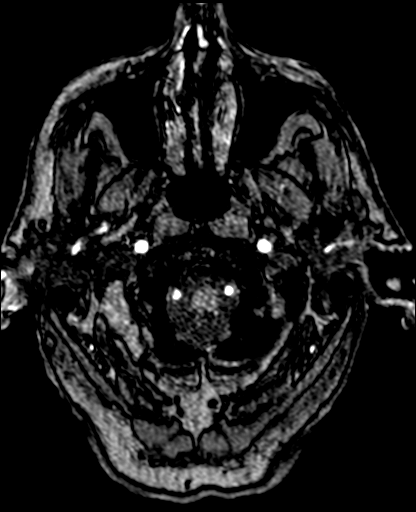
[im 12/188]
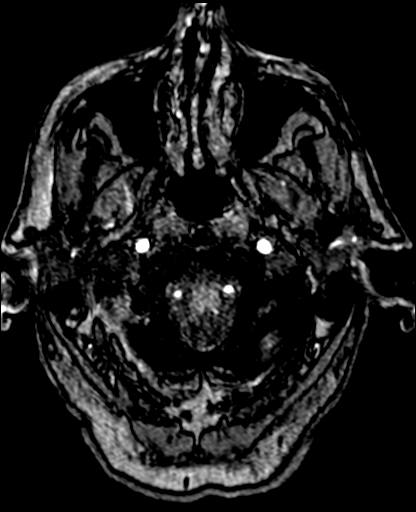
[im 16/188]
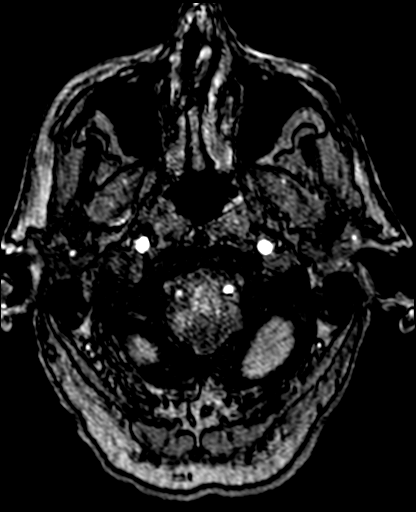
[im 20/188]
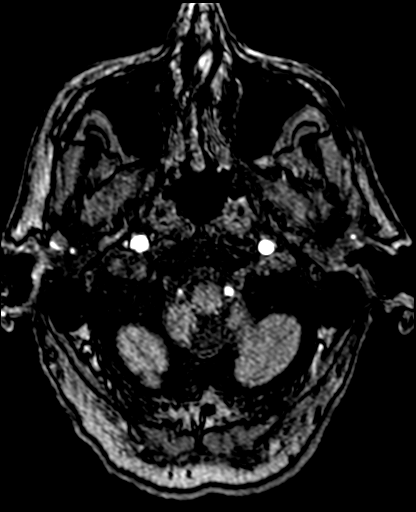
[im 24/188]
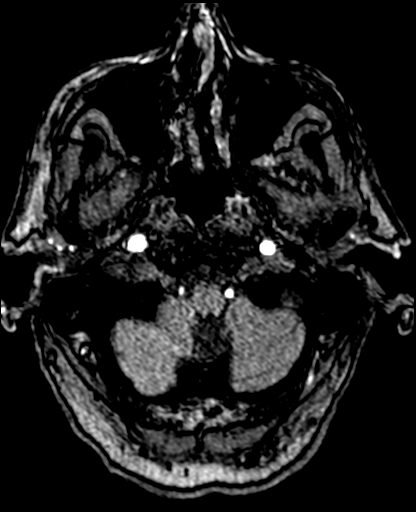
[im 28/188]
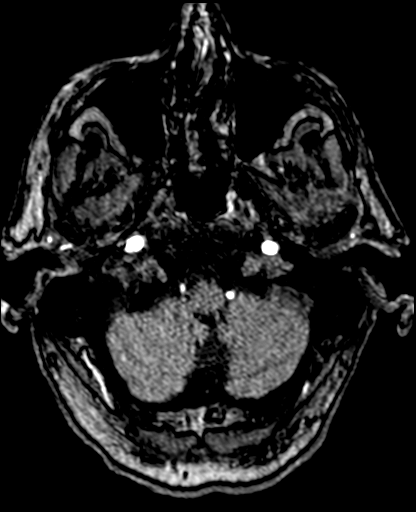
[im 32/188]
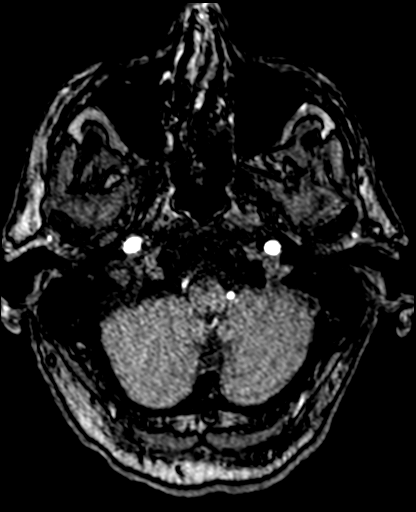
[im 36/188]
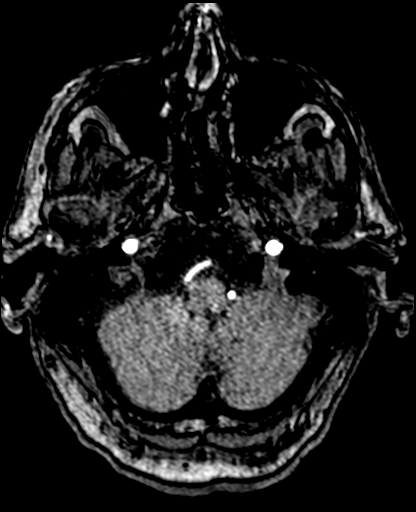
[im 40/188]
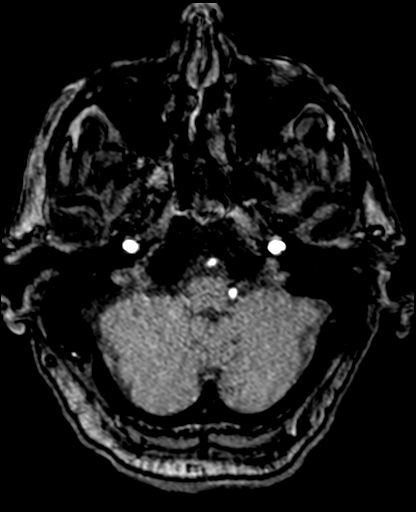
[im 44/188]
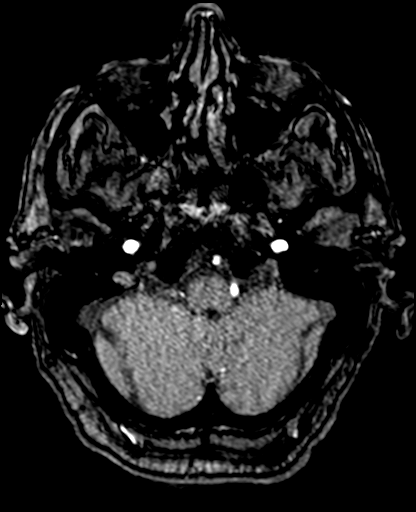
[im 48/188]
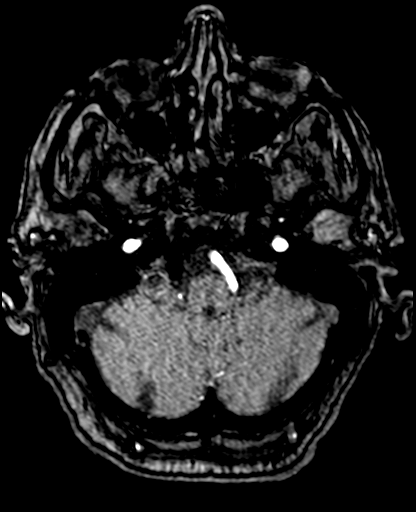
[im 52/188]
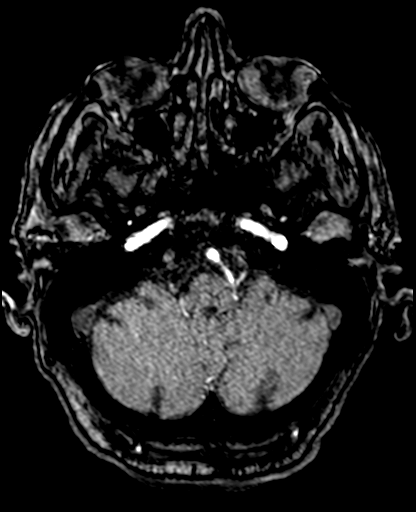
[im 56/188]
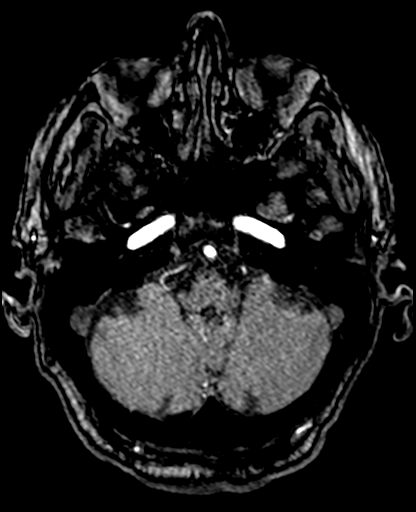
[im 60/188]
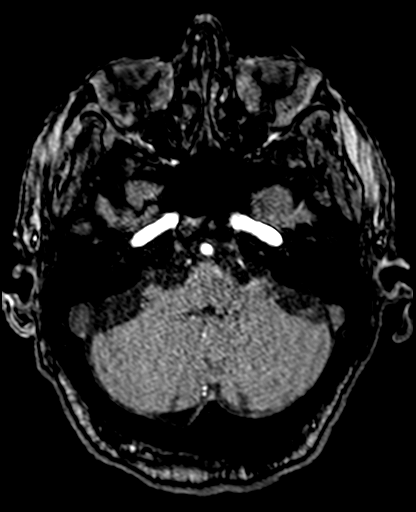
[im 64/188]
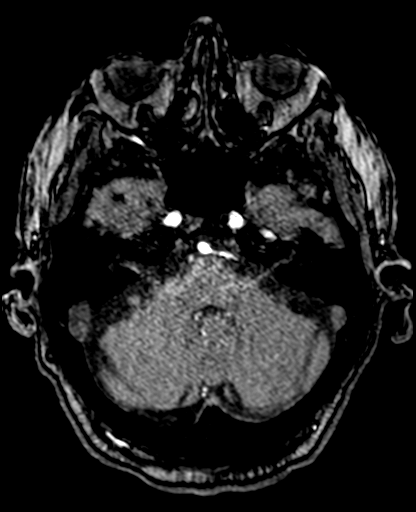
[im 84/188]
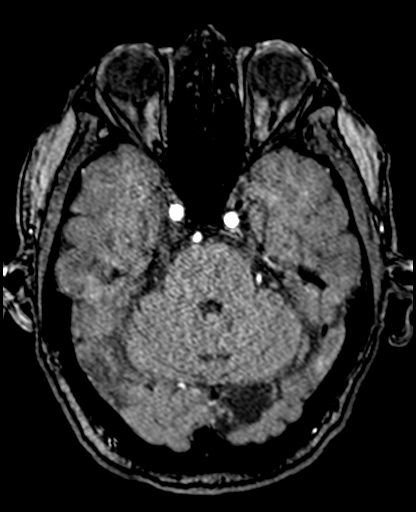
[im 96/188]
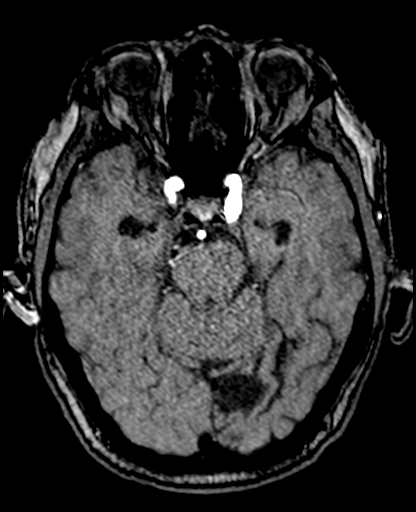
[im 108/188]
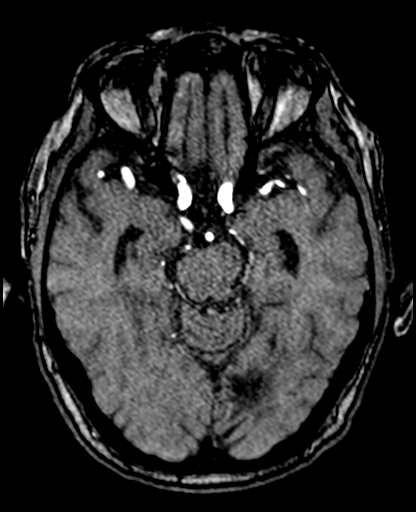
[im 132/188]
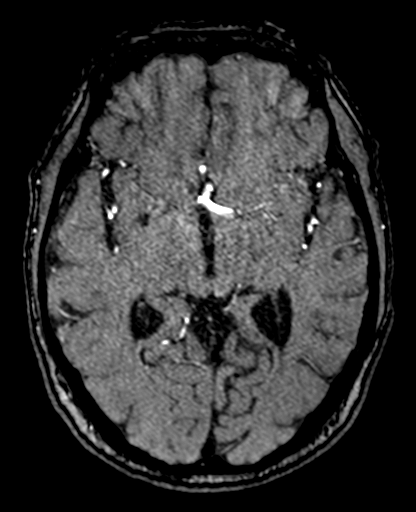
[im 156/188]
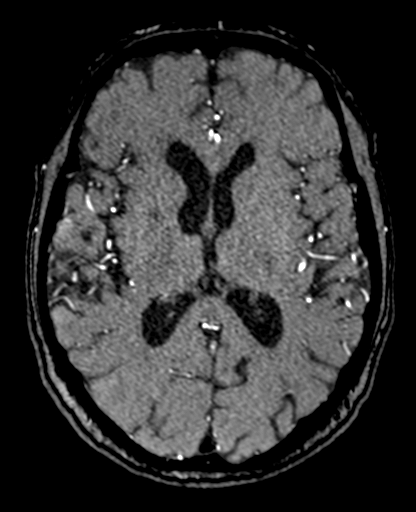
[im 160/188]
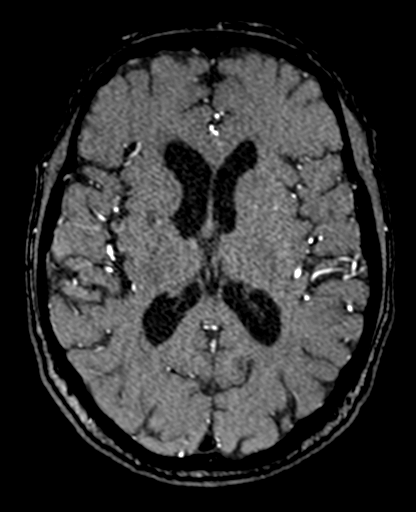
[im 180/188]
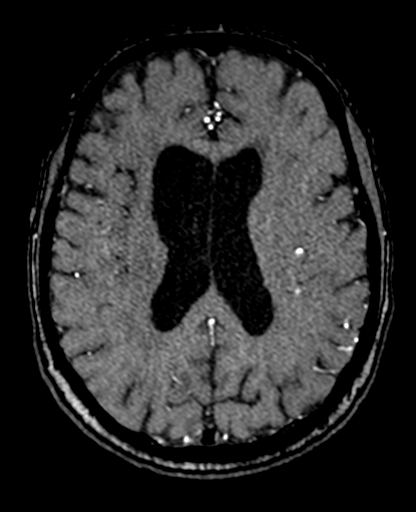

[24 of 48 positions shown; findings below may reference images not displayed]

FINDINGS: MRI HEAD FINDINGS

Brain: Small acute infarct of the lateral right thalamus. No acute
or chronic hemorrhage. There is multifocal hyperintense T2-weighted
signal within the white matter. Generalized volume loss without a
clear lobar predilection. Old left occipital infarct. The midline
structures are normal.

Vascular: Major flow voids are preserved.

Skull and upper cervical spine: Normal calvarium and skull base.
Visualized upper cervical spine and soft tissues are normal.

Sinuses/Orbits:No paranasal sinus fluid levels or advanced mucosal
thickening. No mastoid or middle ear effusion. Normal orbits.

MRA HEAD FINDINGS

POSTERIOR CIRCULATION:

--Vertebral arteries: Normal

--Inferior cerebellar arteries: Normal.

--Basilar artery: Normal.

--Superior cerebellar arteries: Normal.

--Posterior cerebral arteries: Left PCA occlusion at the P1-2
junction. Normal right PCA.

ANTERIOR CIRCULATION:

--Intracranial internal carotid arteries: Normal.

--Anterior cerebral arteries (ACA): Normal.

--Middle cerebral arteries (MCA): Normal.

ANATOMIC VARIANTS: None
IMPRESSION: 1. Small acute infarct of the lateral right thalamus. No hemorrhage
or mass effect.
2. Left PCA occlusion at the P1-2 junction with old left occipital
infarct.
3. Chronic small vessel ischemic changes and atrophy.

## 2021-01-26 MED ORDER — SODIUM CHLORIDE 0.9% FLUSH
3.0000 mL | Freq: Once | INTRAVENOUS | Status: DC
Start: 2021-01-26 — End: 2021-01-28

## 2021-01-26 MED ORDER — LORAZEPAM 2 MG/ML IJ SOLN
0.5000 mg | INTRAMUSCULAR | Status: DC | PRN
  Administered 2021-01-26: 0.5 mg via INTRAVENOUS
  Filled 2021-01-26: qty 1

## 2021-01-26 NOTE — ED Notes (Signed)
Transported to MRI with transport tech

## 2021-01-26 NOTE — H&P (Signed)
History and Physical    Isaac Stokes UJW:[id number] DOB: October 25, 1950 DOA: 01/26/2021  PCP: Pcp, No Patient coming from: Home  Chief Complaint: Left-sided numbness/tingling  HPI: Isaac Stokes is a 70 y.o. male with medical history significant of CVA, tobacco use presented to the ED with complaints of numbness and tingling on the left side of his body, gait instability which started at 9 AM, LKN at 2 am.  No significant lab abnormalities except borderline anemia and creatinine 1.7.  CT head negative for acute intracranial abnormality.  Brain MRI/MRA head showing a small acute infarct of the lateral right thalamus.  No hemorrhage or mass-effect. 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 Neurology Dr. Otelia LimesLindzen consulted.  Patient states he had a stroke 18 years ago and takes a baby aspirin daily.  This morning around 9 AM when he woke up from his sleep he felt numbness and tingling on the entire left side of his body starting from the head all the way down to his foot.  He then got up to go use the bathroom and felt very unsteady and had to hold onto the wall.  After returning from the bathroom he went back to sleep hoping his symptoms would resolve but then woke up around noon and was still symptomatic which prompted him to come into the emergency room to be evaluated.  He does not think he had any weakness even though he felt unsteady he was able to climb stairs.  Patient also reports history of episodes of chest pain for the past year.  He describes it as left-sided chest pain radiating to his left arm and states whenever it happens he has to hold his chest or arm.  No associated dyspnea, diaphoresis, or nausea.  Chest pain usually happens when he is resting at night and not with exertion.  Most recent episode of chest pain was 2 weeks ago.  He has not had a stress test or cardiac catheterization done in the past but does mention that his doctor once told him that he  had a heart valve abnormality and a heart murmur.  He also endorses polyuria and polydipsia.  Drinks a lot of regular Coke.  He smokes 5 or more cigarettes every day.  No other complaints.  Denies fevers, cough, or shortness of breath.  Review of Systems:  All systems reviewed and apart from history of presenting illness, are negative.  Past Medical History:  Diagnosis Date  . TIA (transient ischemic attack)     History reviewed. No pertinent surgical history.   reports that he has been smoking cigarettes. He has never used smokeless tobacco. He reports that he does not drink alcohol and does not use drugs.  No Known Allergies  History reviewed. No pertinent family history.  Prior to Admission medications   Medication Sig Start Date End Date Taking? Authorizing Provider  acetaminophen (TYLENOL) 500 MG tablet Take 1,000 mg by mouth in the morning and at bedtime.   Yes [provider]  ciclopirox (PENLAC) 8 % solution Apply topically at bedtime. Apply over nail and surrounding skin. Apply daily over previous coat. After seven (7) days, may remove with alcohol and continue cycle. 12/17/20  Yes Vivi BarrackWagoner, Matthew R, DPM    Physical Exam: Vitals:   01/26/21 2154 01/26/21 2200 01/26/21 2230 01/26/21 2330  BP: (!) 159/99 (!) 159/99 (!) 144/93 127/84  Pulse: 65 69 64 66  Resp: 15 12 20 20   Temp:  TempSrc:      SpO2: 100% 99% 100% 97%  Weight:      Height:        Physical Exam Constitutional:      General: He is not in acute distress. HENT:     Head: Normocephalic and atraumatic.  Eyes:     Extraocular Movements: Extraocular movements intact.     Conjunctiva/sclera: Conjunctivae normal.  Cardiovascular:     Rate and Rhythm: Normal rate and regular rhythm.     Pulses: Normal pulses.  Pulmonary:     Effort: Pulmonary effort is normal. No respiratory distress.     Breath sounds: Normal breath sounds. No wheezing or rales.  Abdominal:     General: Bowel sounds are  normal. There is no distension.     Palpations: Abdomen is soft.     Tenderness: There is no abdominal tenderness.  Musculoskeletal:        General: No swelling or tenderness.     Cervical back: Normal range of motion and neck supple.  Skin:    General: Skin is warm and dry.  Neurological:     Mental Status: He is alert and oriented to person, place, and time.     Comments: Speech fluent, tongue midline, no facial droop Sensation to light touch diminished on the left side of the face, left upper and lower extremity Able to raise all extremities up against gravity but does have left pronator drift     Labs on Admission: I have personally reviewed following labs and imaging studies  CBC: Recent Labs  Lab 01/26/21 1404 01/26/21 1505  WBC 6.9  --   NEUTROABS 4.5  --   HGB 12.8* 12.9*  HCT 40.4 38.0*  MCV 96.4  --   PLT 239  --    Basic Metabolic Panel: Recent Labs  Lab 01/26/21 1404 01/26/21 1505  NA 138 140  K 4.2 4.2  CL 107 109  CO2 22  --   GLUCOSE 105* 102*  BUN 28* 29*  CREATININE 1.70* 1.70*  CALCIUM 9.0  --    GFR: Estimated Creatinine Clearance: 43 mL/min (A) (by C-G formula based on SCr of 1.7 mg/dL (H)). Liver Function Tests: Recent Labs  Lab 01/26/21 1404  AST 20  ALT 15  ALKPHOS 45  BILITOT 0.5  PROT 7.2  ALBUMIN 3.8   No results for input(s): LIPASE, AMYLASE in the last 168 hours. No results for input(s): AMMONIA in the last 168 hours. Coagulation Profile: Recent Labs  Lab 01/26/21 1404  INR 1.1   Cardiac Enzymes: No results for input(s): CKTOTAL, CKMB, CKMBINDEX, TROPONINI in the last 168 hours. BNP (last 3 results) No results for input(s): PROBNP in the last 8760 hours. HbA1C: No results for input(s): HGBA1C in the last 72 hours. CBG: Recent Labs  Lab 01/26/21 1722  GLUCAP 92   Lipid Profile: No results for input(s): CHOL, HDL, LDLCALC, TRIG, CHOLHDL, LDLDIRECT in the last 72 hours. Thyroid Function Tests: No results for  input(s): TSH, T4TOTAL, FREET4, T3FREE, THYROIDAB in the last 72 hours. Anemia Panel: No results for input(s): VITAMINB12, FOLATE, FERRITIN, TIBC, IRON, RETICCTPCT in the last 72 hours. Urine analysis: No results found for: COLORURINE, APPEARANCEUR, LABSPEC, PHURINE, GLUCOSEU, HGBUR, BILIRUBINUR, KETONESUR, PROTEINUR, UROBILINOGEN, NITRITE, LEUKOCYTESUR  Radiological Exams on Admission: CT HEAD WO CONTRAST  Result Date: 01/26/2021 CLINICAL DATA:  Weakness EXAM: CT HEAD WITHOUT CONTRAST TECHNIQUE: Contiguous axial images were obtained from the base of the skull through the vertex without intravenous contrast.  COMPARISON:  None. FINDINGS: Brain: No evidence of acute infarction, hemorrhage, hydrocephalus, extra-axial collection or mass lesion/mass effect. Chronic white matter ischemic change is seen. Additionally prior infarcts are noted in the anterior limb of the internal capsule on the right as well as in the left occipital lobe Vascular: No hyperdense vessel or unexpected calcification. Skull: Normal. Negative for fracture or focal lesion. Sinuses/Orbits: No acute finding. Other: None. IMPRESSION: Chronic ischemic changes are noted without acute abnormality. Electronically Signed   By: Alcide Clever M.D.   On: 01/26/2021 15:08   MR ANGIO HEAD WO CONTRAST  Result Date: 01/26/2021 CLINICAL DATA:  Left-sided tingling and numbness EXAM: MRI HEAD WITHOUT CONTRAST MRA HEAD WITHOUT CONTRAST TECHNIQUE: Multiplanar, multi-echo pulse sequences of the brain and surrounding structures were acquired without intravenous contrast. Angiographic images of the Circle of Willis were acquired using MRA technique without intravenous contrast. COMPARISON: No pertinent prior exam. COMPARISON:  No pertinent prior exam. FINDINGS: MRI HEAD FINDINGS Brain: Small acute infarct of the lateral right thalamus. No acute or chronic hemorrhage. There is multifocal hyperintense T2-weighted signal within the white matter. Generalized  volume loss without a clear lobar predilection. Old left occipital infarct. The midline structures are normal. Vascular: Major flow voids are preserved. Skull and upper cervical spine: Normal calvarium and skull base. Visualized upper cervical spine and soft tissues are normal. Sinuses/Orbits:No paranasal sinus fluid levels or advanced mucosal thickening. No mastoid or middle ear effusion. Normal orbits. MRA HEAD FINDINGS POSTERIOR CIRCULATION: --Vertebral arteries: Normal --Inferior cerebellar arteries: Normal. --Basilar artery: Normal. --Superior cerebellar arteries: Normal. 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 Normal right PCA. ANTERIOR CIRCULATION: --Intracranial internal carotid arteries: Normal. --Anterior cerebral arteries (ACA): Normal. --Middle cerebral arteries (MCA): Normal. ANATOMIC VARIANTS: None IMPRESSION: 1. 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 No hemorrhage or mass effect. 2. 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 3. Chronic small vessel ischemic changes and atrophy. Electronically Signed   By: Deatra Robinson M.D.   On: 01/26/2021 21:16   MR BRAIN WO CONTRAST  Result Date: 01/26/2021 CLINICAL DATA:  Left-sided tingling and numbness EXAM: MRI HEAD WITHOUT CONTRAST MRA HEAD WITHOUT CONTRAST TECHNIQUE: Multiplanar, multi-echo pulse sequences of the brain and surrounding structures were acquired without intravenous contrast. Angiographic images of the Circle of Willis were acquired using MRA technique without intravenous contrast. COMPARISON: No pertinent prior exam. COMPARISON:  No pertinent prior exam. FINDINGS: MRI HEAD FINDINGS Brain: Small acute infarct of the lateral right thalamus. No acute or chronic hemorrhage. There is multifocal hyperintense T2-weighted signal within the white matter. Generalized volume loss without a clear lobar predilection. Old left occipital infarct. The midline structures are normal.  Vascular: Major flow voids are preserved. Skull and upper cervical spine: Normal calvarium and skull base. Visualized upper cervical spine and soft tissues are normal. Sinuses/Orbits:No paranasal sinus fluid levels or advanced mucosal thickening. No mastoid or middle ear effusion. Normal orbits. MRA HEAD FINDINGS POSTERIOR CIRCULATION: --Vertebral arteries: Normal --Inferior cerebellar arteries: Normal. --Basilar artery: Normal. --Superior cerebellar arteries: Normal. 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 Normal right PCA. ANTERIOR CIRCULATION: --Intracranial internal carotid arteries: Normal. --Anterior cerebral arteries (ACA): Normal. --Middle cerebral arteries (MCA): Normal. ANATOMIC VARIANTS: None IMPRESSION: 1. 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 No hemorrhage or mass effect. 2. 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 3. Chronic small vessel ischemic changes and atrophy. Electronically Signed  By: Deatra Robinson M.D.   On: 01/26/2021 21:16    EKG: Independently reviewed.  Sinus rhythm, no prior tracing for comparison.  Assessment/Plan Principal Problem:   Acute CVA (cerebrovascular accident) [facility]) Active Problems:   Renal insufficiency   Polyuria   Chest pain   Tobacco use   Acute CVA Patient with history of prior stroke on aspirin 81 mg daily presents with complaints of numbness and tingling on the left side of the body, gait instability.  Out of tPA window.  CT head negative for acute intracranial abnormality.  Brain MRI/MRA head showing a small acute infarct of the lateral right thalamus.  No hemorrhage or mass-effect. 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  -Neurology consulted -Telemetry monitoring -Allow for permissive hypertension-treat only if SBP >220 or DBP >120 -Transthoracic echocardiogram -Hemoglobin A1c, fasting lipid panel -Antiplatelet therapy per neurology  recommendations -Frequent neurochecks -PT, OT, speech therapy. -N.p.o. until cleared by bedside swallow evaluation or formal speech evaluation  Renal insufficiency Creatinine 1.7.  Unclear whether this is acute or chronic as there are no prior labs for comparison. -IV fluid hydration, monitor BMP, avoid nephrotoxic agents  Polyuria, polydipsia -Check A1c, UA  Chest pain Ongoing for the past year and appears to be nonexertional.  Most recent episode of chest pain was 2 weeks ago.  EKG without acute ischemic changes.  No chest pain at present. -Echocardiogram ordered to assess for regional wall motion abnormalities versus valvular abnormality.  Will need cardiology follow-up.  Tobacco use -NicoDerm patch and counseling  DVT prophylaxis: Lovenox Code Status: Patient wishes to be full code Family Communication: No family available at this time. Disposition Plan: Status is: Inpatient  Remains inpatient appropriate because:Ongoing diagnostic testing needed not appropriate for outpatient work up and Inpatient level of care appropriate due to severity of illness   Dispo: The patient is from: Home              Anticipated d/c is to: Home              Patient currently is not medically stable to d/c.   Difficult to place patient No  Level of care: Level of care: Telemetry Medical   The medical decision making on this patient was of high complexity and the patient is at high risk for clinical deterioration, therefore this is a level 3 visit.  John Giovanni MD [facility]  If 7PM-7AM, please contact night-coverage [email or web address]  01/27/2021, 12:58 AM

## 2021-01-26 NOTE — ED Triage Notes (Signed)
Pt reports waking up at 9am with numbness and tingling on the left side of body. LKW 2am. Pt reporting still feeling numbness and lightheadness with nausea. Difficulty ambulating.  Weakness with drift to left extremities and left sided numbness still at this time. Negative LVO. Hx of TIA, takes baby asa daily.

## 2021-01-26 NOTE — ED Provider Notes (Signed)
[facility] EMERGENCY DEPARTMENT Provider Note   CSN: [id number] Arrival date & time: 01/26/21  1354     History Chief Complaint  Patient presents with  . Numbness  . Extremity Weakness    Isaac Stokes is a 70 y.o. male.  HPI He awoke this morning with a sensation of numbness of his left face, left arm and left leg.  This sensation was associated with some difficulty walking secondary to feeling of "dizziness."  Symptoms have persisted since I started.  They were there upon awakening.  They were not there when he went to sleep at 2 AM last night.  No prior similar problems.  No difficulty eating today.  He denies chest pain, shortness of breath, focal weakness.  He does recall having some trouble holding his coffee cup this morning and dropped it, accidentally while carrying it with the left arm.  There are no other known active modifying factors.    Past Medical History:  Diagnosis Date  . TIA (transient ischemic attack)     There are no problems to display for this patient.   History reviewed. No pertinent surgical history.     No family history on file.  Social History   Tobacco Use  . Smoking status: Light Tobacco Smoker    Types: Cigarettes  . Smokeless tobacco: Never Used  Substance Use Topics  . Alcohol use: Never  . Drug use: Never    Home Medications Prior to Admission medications   Medication Sig Start Date End Date Taking? Authorizing Provider  acetaminophen (TYLENOL) 500 MG tablet Take 1,000 mg by mouth in the morning and at bedtime.   Yes [provider]  ciclopirox (PENLAC) 8 % solution Apply topically at bedtime. Apply over nail and surrounding skin. Apply daily over previous coat. After seven (7) days, may remove with alcohol and continue cycle. 12/17/20  Yes Vivi Barrack, DPM    Allergies    Patient has no known allergies.  Review of Systems   Review of Systems  All other systems reviewed and are  negative.   Physical Exam Updated Vital Signs BP (!) 159/99   Pulse 69   Temp 97.6 F (36.4 C) (Oral)   Resp 12   Ht 6' (1.829 m)   Wt 76.2 kg   SpO2 99%   BMI 22.78 kg/m   Physical Exam Vitals and nursing note reviewed.  Constitutional:      General: He is not in acute distress.    Appearance: He is well-developed. He is not ill-appearing, toxic-appearing or diaphoretic.  HENT:     Head: Normocephalic and atraumatic.     Right Ear: External ear normal.     Left Ear: External ear normal.  Eyes:     Conjunctiva/sclera: Conjunctivae normal.     Pupils: Pupils are equal, round, and reactive to light.  Neck:     Trachea: Phonation normal.  Cardiovascular:     Rate and Rhythm: Normal rate and regular rhythm.     Heart sounds: Normal heart sounds.  Pulmonary:     Effort: Pulmonary effort is normal.     Breath sounds: Normal breath sounds.  Abdominal:     General: There is no distension.     Palpations: Abdomen is soft.     Tenderness: There is no abdominal tenderness.  Musculoskeletal:        General: Normal range of motion.     Cervical back: Normal range of motion and neck supple.  Skin:    General: Skin is warm and dry.  Neurological:     Mental Status: He is alert and oriented to person, place, and time.     Cranial Nerves: No cranial nerve deficit.     Motor: No abnormal muscle tone.     Coordination: Coordination normal.     Comments: No dysarthria, aphasia or nystagmus.  No pronator drift.  No ataxia.  Decreased light touch sensation of the left face, left hand, and left lower leg.  Psychiatric:        Mood and Affect: Mood normal.        Behavior: Behavior normal.        Thought Content: Thought content normal.        Judgment: Judgment normal.     ED Results / Procedures / Treatments   Labs (all labs ordered are listed, but only abnormal results are displayed) Labs Reviewed  CBC - Abnormal; Notable for the following components:      Result Value    RBC 4.19 (*)    Hemoglobin 12.8 (*)    All other components within normal limits  COMPREHENSIVE METABOLIC PANEL - Abnormal; Notable for the following components:   Glucose, Bld 105 (*)    BUN 28 (*)    Creatinine, Ser 1.70 (*)    GFR, Estimated 43 (*)    All other components within normal limits  I-STAT CHEM 8, ED - Abnormal; Notable for the following components:   BUN 29 (*)    Creatinine, Ser 1.70 (*)    Glucose, Bld 102 (*)    Hemoglobin 12.9 (*)    HCT 38.0 (*)    All other components within normal limits  RESP PANEL BY RT-PCR (FLU A&B, COVID) ARPGX2  PROTIME-INR  APTT  DIFFERENTIAL  CBG MONITORING, ED    EKG EKG Interpretation  Date/Time:  Tuesday Jan 26 2021 14:12:23 EDT Ventricular Rate:  85 PR Interval:  150 QRS Duration: 86 QT Interval:  358 QTC Calculation: 426 R Axis:   -61 Text Interpretation: Normal sinus rhythm Left axis deviation Abnormal ECG since last tracing no significant change Confirmed by Mancel Bale [phone]) on 01/26/2021 4:44:38 PM   Radiology CT HEAD WO CONTRAST  Result Date: 01/26/2021 CLINICAL DATA:  Weakness EXAM: CT HEAD WITHOUT CONTRAST TECHNIQUE: Contiguous axial images were obtained from the base of the skull through the vertex without intravenous contrast. COMPARISON:  None. FINDINGS: Brain: No evidence of acute infarction, hemorrhage, hydrocephalus, extra-axial collection or mass lesion/mass effect. Chronic white matter ischemic change is seen. Additionally prior infarcts are noted in the anterior limb of the internal capsule on the right as well as in the left occipital lobe Vascular: No hyperdense vessel or unexpected calcification. Skull: Normal. Negative for fracture or focal lesion. Sinuses/Orbits: No acute finding. Other: None. IMPRESSION: Chronic ischemic changes are noted without acute abnormality. Electronically Signed   By: Alcide Clever M.D.   On: 01/26/2021 15:08   MR ANGIO HEAD WO CONTRAST  Result Date: 01/26/2021 CLINICAL  DATA:  Left-sided tingling and numbness EXAM: MRI HEAD WITHOUT CONTRAST MRA HEAD WITHOUT CONTRAST TECHNIQUE: Multiplanar, multi-echo pulse sequences of the brain and surrounding structures were acquired without intravenous contrast. Angiographic images of the Circle of Willis were acquired using MRA technique without intravenous contrast. COMPARISON: No pertinent prior exam. COMPARISON:  No pertinent prior exam. FINDINGS: MRI HEAD FINDINGS Brain: Small acute infarct of the lateral right thalamus. No acute or chronic hemorrhage. There is multifocal hyperintense T2-weighted signal  within the white matter. Generalized volume loss without a clear lobar predilection. Old left occipital infarct. The midline structures are normal. Vascular: Major flow voids are preserved. Skull and upper cervical spine: Normal calvarium and skull base. Visualized upper cervical spine and soft tissues are normal. Sinuses/Orbits:No paranasal sinus fluid levels or advanced mucosal thickening. No mastoid or middle ear effusion. Normal orbits. MRA HEAD FINDINGS POSTERIOR CIRCULATION: --Vertebral arteries: Normal --Inferior cerebellar arteries: Normal. --Basilar artery: Normal. --Superior cerebellar arteries: Normal. 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 Normal right PCA. ANTERIOR CIRCULATION: --Intracranial internal carotid arteries: Normal. --Anterior cerebral arteries (ACA): Normal. --Middle cerebral arteries (MCA): Normal. ANATOMIC VARIANTS: None IMPRESSION: 1. 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 No hemorrhage or mass effect. 2. 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 3. Chronic small vessel ischemic changes and atrophy. Electronically Signed   By: Deatra Robinson M.D.   On: 01/26/2021 21:16   MR BRAIN WO CONTRAST  Result Date: 01/26/2021 CLINICAL DATA:  Left-sided tingling and numbness EXAM: MRI HEAD WITHOUT CONTRAST MRA HEAD WITHOUT CONTRAST TECHNIQUE:  Multiplanar, multi-echo pulse sequences of the brain and surrounding structures were acquired without intravenous contrast. Angiographic images of the Circle of Willis were acquired using MRA technique without intravenous contrast. COMPARISON: No pertinent prior exam. COMPARISON:  No pertinent prior exam. FINDINGS: MRI HEAD FINDINGS Brain: Small acute infarct of the lateral right thalamus. No acute or chronic hemorrhage. There is multifocal hyperintense T2-weighted signal within the white matter. Generalized volume loss without a clear lobar predilection. Old left occipital infarct. The midline structures are normal. Vascular: Major flow voids are preserved. Skull and upper cervical spine: Normal calvarium and skull base. Visualized upper cervical spine and soft tissues are normal. Sinuses/Orbits:No paranasal sinus fluid levels or advanced mucosal thickening. No mastoid or middle ear effusion. Normal orbits. MRA HEAD FINDINGS POSTERIOR CIRCULATION: --Vertebral arteries: Normal --Inferior cerebellar arteries: Normal. --Basilar artery: Normal. --Superior cerebellar arteries: Normal. 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 Normal right PCA. ANTERIOR CIRCULATION: --Intracranial internal carotid arteries: Normal. --Anterior cerebral arteries (ACA): Normal. --Middle cerebral arteries (MCA): Normal. ANATOMIC VARIANTS: None IMPRESSION: 1. 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 No hemorrhage or mass effect. 2. 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 3. Chronic small vessel ischemic changes and atrophy. Electronically Signed   By: Deatra Robinson M.D.   On: 01/26/2021 21:16    Procedures .Critical Care Performed by: Mancel Bale, MD Authorized by: Mancel Bale, MD   Critical care provider statement:    Critical care time (minutes):  45   Critical care start time:  01/26/2021 4:40 PM   Critical care end time:  01/26/2021 10:42 PM    Critical care time was exclusive of:  Separately billable procedures and treating other patients   Critical care was necessary to treat or prevent imminent or life-threatening deterioration of the following conditions:  CNS failure or compromise   Critical care was time spent personally by me on the following activities:  Blood draw for specimens, development of treatment plan with patient or surrogate, discussions with consultants, evaluation of patient's response to treatment, examination of patient, obtaining history from patient or surrogate, ordering and performing treatments and interventions, ordering and review of laboratory studies, pulse oximetry, re-evaluation of patient's condition, review of old charts and ordering and review of radiographic studies     Medications Ordered in ED Medications  sodium chloride  flush (NS) 0.9 % injection 3 mL (has no administration in time range)  LORazepam (ATIVAN) injection 0.5 mg (0.5 mg Intravenous Given 01/26/21 2011)    ED Course  I have reviewed the triage vital signs and the nursing notes.  Pertinent labs & imaging results that were available during my care of the patient were reviewed by me and considered in my medical decision making (see chart for details).  Clinical Course as of 01/26/21 2242  Tue Jan 26, 2021  1726 CT head no acute.  Symptoms consistent with stroke, occurring within the last 24 hours.  MRI ordered to evaluate further. [EW]  2240 Case discussed with neurologist, Dr. Otelia Limes.  He will see the patient as a Research scientist (medical). [EW]    Clinical Course User Index [EW] Mancel Bale, MD   MDM Rules/Calculators/A&P                           Patient Vitals for the past 24 hrs:  BP Temp Temp src Pulse Resp SpO2 Height Weight  01/26/21 2200 (!) 159/99 -- -- 69 12 99 % -- --  01/26/21 2154 (!) 159/99 -- -- 65 15 100 % -- --  01/26/21 2115 (!) 161/83 -- -- 64 (!) 21 -- -- --  01/26/21 2110 -- -- -- 62 -- 96 % -- --  01/26/21 2015  -- -- -- 71 -- 100 % -- --  01/26/21 1930 -- -- -- 60 -- 100 % -- --  01/26/21 1915 -- -- -- (!) 56 -- 98 % -- --  01/26/21 1900 (!) 154/84 97.6 F (36.4 C) Oral (!) 54 -- 98 % -- --  01/26/21 1845 -- -- -- (!) 58 -- 98 % -- --  01/26/21 1830 (!) 150/87 -- -- (!) 59 16 99 % -- --  01/26/21 1800 (!) 143/99 -- -- 61 15 99 % -- --  01/26/21 1745 -- -- -- (!) 57 18 99 % -- --  01/26/21 1735 (!) 161/91 -- -- 63 16 99 % -- --  01/26/21 1710 -- -- -- -- 18 -- -- --  01/26/21 1537 140/90 98.5 F (36.9 C) Oral 67 16 99 % -- --  01/26/21 1402 -- -- -- -- -- -- 6' (1.829 m) 76.2 kg  01/26/21 1401 (!) 167/102 98.3 F (36.8 C) Oral 86 18 99 % -- --    10:20 PM Reevaluation with update and discussion. After initial assessment and treatment, an updated evaluation reveals no change in clinical status, findings cussed with the patient and all questions were answered. Mancel Bale   Medical Decision Making:  This patient is presenting for evaluation of dizziness, trouble walking and numbness., which does require a range of treatment options, and is a complaint that involves a high risk of morbidity and mortality. The differential diagnoses include CVA, TIA, metabolic disorder, hemodynamic instability. I decided to review old records, and in summary elderly male presenting with sensation of numbness and dizziness which was present upon awaking this morning around 8 AM..  I did not require additional historical information from Anyone.  Clinical Laboratory Tests Ordered, included CBC, Metabolic panel and PT, PTT, viral infection panel. Review indicates normal except glucose high, BUN high, creatinine high, hemoglobin low. Radiologic Tests Ordered, included CT head, MRI brain.  I independently Visualized: Radiograph images, which show head CT negative, MRI brain, MRI head, right thalamus stroke.  No acute intracranial EPIC occlusions.  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  This requires hospitalization for further evaluation.  Consultation with neurologist.  Admission by unassigned medical team  CRITICAL CARE-yes Performed by: Mancel BaleElliott Dorreen Valiente  Nursing Notes Reviewed/ Care Coordinated Applicable Imaging Reviewed Interpretation of Laboratory Data incorporated into ED treatment   10:40 PM-Consult complete with hospitalist. Patient case explained and discussed.  She agrees to admit patient for further evaluation and treatment. Call ended at 10:55 PM    Final Clinical Impression(s) / ED Diagnoses Final diagnoses:  Cerebral infarction, unspecified mechanism [facility](HCC)    Rx / DC Orders ED Discharge Orders    None       Mancel BaleWentz, Braelee Herrle, MD 01/26/21 2259

## 2021-01-26 NOTE — ED Provider Notes (Signed)
Emergency Medicine Provider Triage Evaluation Note  LAMOUNT BANKSON , a 70 y.o. male  was evaluated in triage.  Pt complains of tingling/decreased sensation to left side of body that he noticed this AM at 9 when he woke up. Went to bed around 2 AM after watching TV which was his LKN. Hx of stroke 20 years ago. Takes a baby aspirin. Also feels like he is drifting to left side with ambulation  Review of Systems  Positive: + tingling/decreased sensation Negative: - HA, speech changes  Physical Exam  BP (!) 167/102 (BP Location: Left Arm)   Pulse 86   Temp 98.3 F (36.8 C) (Oral)   Resp 18   Ht 6' (1.829 m)   Wt 76.2 kg   SpO2 99%   BMI 22.78 kg/m  Gen:   Awake, no distress   Resp:  Normal effort  MSK:   Moves extremities without difficulty  Other:  + decreased sensation to left side of body and face with + left pronator drift  Medical Decision Making  Medically screening exam initiated at 2:11 PM.  Appropriate orders placed.  OMEED OSUNA was informed that the remainder of the evaluation will be completed by another provider, this initial triage assessment does not replace that evaluation, and the importance of remaining in the ED until their evaluation is complete.  Concern for stroke. LKN 2 AM. Out of the window. No findings to suggest LVO. Labs and CT Head ordered.    Tanda Rockers, PA-C 01/26/21 1413    Jacalyn Lefevre, MD 01/26/21 [phone]

## 2021-01-27 ENCOUNTER — Inpatient Hospital Stay (HOSPITAL_COMMUNITY): Payer: Medicare Other

## 2021-01-27 ENCOUNTER — Encounter (HOSPITAL_COMMUNITY): Payer: Self-pay | Admitting: Internal Medicine

## 2021-01-27 DIAGNOSIS — R202 Paresthesia of skin: Secondary | ICD-10-CM | POA: Diagnosis not present

## 2021-01-27 DIAGNOSIS — Z720 Tobacco use: Secondary | ICD-10-CM

## 2021-01-27 DIAGNOSIS — N289 Disorder of kidney and ureter, unspecified: Secondary | ICD-10-CM

## 2021-01-27 DIAGNOSIS — I6389 Other cerebral infarction: Secondary | ICD-10-CM

## 2021-01-27 DIAGNOSIS — I639 Cerebral infarction, unspecified: Secondary | ICD-10-CM | POA: Diagnosis not present

## 2021-01-27 DIAGNOSIS — R3589 Other polyuria: Secondary | ICD-10-CM

## 2021-01-27 DIAGNOSIS — R079 Chest pain, unspecified: Secondary | ICD-10-CM

## 2021-01-27 LAB — URINALYSIS, ROUTINE W REFLEX MICROSCOPIC
Bilirubin Urine: NEGATIVE
Glucose, UA: NEGATIVE mg/dL
Hgb urine dipstick: NEGATIVE
Ketones, ur: NEGATIVE mg/dL
Leukocytes,Ua: NEGATIVE
Nitrite: NEGATIVE
Protein, ur: NEGATIVE mg/dL
Specific Gravity, Urine: 1.016 (ref 1.005–1.030)
pH: 5 (ref 5.0–8.0)

## 2021-01-27 LAB — RAPID URINE DRUG SCREEN, HOSP PERFORMED
Amphetamines: NOT DETECTED
Barbiturates: NOT DETECTED
Benzodiazepines: NOT DETECTED
Cocaine: NOT DETECTED
Opiates: NOT DETECTED
Tetrahydrocannabinol: NOT DETECTED

## 2021-01-27 LAB — BASIC METABOLIC PANEL
Anion gap: 9 (ref 5–15)
BUN: 25 mg/dL — ABNORMAL HIGH (ref 8–23)
CO2: 24 mmol/L (ref 22–32)
Calcium: 8.7 mg/dL — ABNORMAL LOW (ref 8.9–10.3)
Chloride: 103 mmol/L (ref 98–111)
Creatinine, Ser: 1.51 mg/dL — ABNORMAL HIGH (ref 0.61–1.24)
GFR, Estimated: 49 mL/min — ABNORMAL LOW (ref >60)
Glucose, Bld: 80 mg/dL (ref 70–99)
Potassium: 3.6 mmol/L (ref 3.5–5.1)
Sodium: 136 mmol/L (ref 135–145)

## 2021-01-27 LAB — LIPID PANEL
Cholesterol: 234 mg/dL — ABNORMAL HIGH (ref 0–200)
HDL: 40 mg/dL — ABNORMAL LOW (ref >40)
LDL Cholesterol: 166 mg/dL — ABNORMAL HIGH (ref 0–99)
Total CHOL/HDL Ratio: 5.9 RATIO
Triglycerides: 140 mg/dL (ref <150)
VLDL: 28 mg/dL (ref 0–40)

## 2021-01-27 LAB — ECHOCARDIOGRAM COMPLETE
Area-P 1/2: 2.42 cm2
Height: 72 in
P 1/2 time: 497 msec
S' Lateral: 2.9 cm
Weight: 2688 oz

## 2021-01-27 MED ORDER — NICOTINE 7 MG/24HR TD PT24
7.0000 mg | MEDICATED_PATCH | Freq: Every day | TRANSDERMAL | Status: DC
  Administered 2021-01-27 – 2021-01-28 (×2): 7 mg via TRANSDERMAL
  Filled 2021-01-27 (×3): qty 1

## 2021-01-27 MED ORDER — ATORVASTATIN CALCIUM 40 MG PO TABS
40.0000 mg | ORAL_TABLET | Freq: Every day | ORAL | Status: DC
  Administered 2021-01-27: 40 mg via ORAL
  Filled 2021-01-27: qty 1

## 2021-01-27 MED ORDER — SODIUM CHLORIDE 0.9 % IV SOLN: INTRAVENOUS | Status: DC

## 2021-01-27 MED ORDER — ACETAMINOPHEN 325 MG PO TABS: 650.0000 mg | ORAL_TABLET | ORAL | Status: DC | PRN

## 2021-01-27 MED ORDER — CLOPIDOGREL BISULFATE 75 MG PO TABS
75.0000 mg | ORAL_TABLET | Freq: Every day | ORAL | Status: DC
  Administered 2021-01-28: 75 mg via ORAL
  Filled 2021-01-27 (×2): qty 1

## 2021-01-27 MED ORDER — ATORVASTATIN CALCIUM 40 MG PO TABS: 40.0000 mg | ORAL_TABLET | Freq: Every day | ORAL | Status: DC

## 2021-01-27 MED ORDER — ENOXAPARIN SODIUM 40 MG/0.4ML IJ SOSY
40.0000 mg | PREFILLED_SYRINGE | INTRAMUSCULAR | Status: DC
  Administered 2021-01-27 – 2021-01-28 (×2): 40 mg via SUBCUTANEOUS
  Filled 2021-01-27 (×2): qty 0.4

## 2021-01-27 MED ORDER — ASPIRIN 81 MG PO CHEW
81.0000 mg | CHEWABLE_TABLET | Freq: Every day | ORAL | Status: DC
  Administered 2021-01-27 – 2021-01-28 (×2): 81 mg via ORAL
  Filled 2021-01-27 (×2): qty 1

## 2021-01-27 MED ORDER — CLOPIDOGREL BISULFATE 300 MG PO TABS
300.0000 mg | ORAL_TABLET | Freq: Once | ORAL | Status: AC
  Administered 2021-01-27: 300 mg via ORAL
  Filled 2021-01-27: qty 1

## 2021-01-27 MED ORDER — ACETAMINOPHEN 160 MG/5ML PO SOLN: 650.0000 mg | ORAL | Status: DC | PRN

## 2021-01-27 MED ORDER — ACETAMINOPHEN 650 MG RE SUPP: 650.0000 mg | RECTAL | Status: DC | PRN

## 2021-01-27 MED ORDER — STROKE: EARLY STAGES OF RECOVERY BOOK: Freq: Once | Status: DC

## 2021-01-27 NOTE — Plan of Care (Signed)

## 2021-01-27 NOTE — Progress Notes (Signed)
PT Cancellation Note  Patient Details Name: Isaac Stokes MRN: [id number] DOB: [birth year]   Cancelled Treatment:    Reason Eval/Treat Not Completed: Patient at procedure or test/unavailable PT currently having echo. Will follow up as schedule allows.   Cindee Salt, DPT  Acute Rehabilitation Services  Pager: [phone] Office: [phone]    Lehman Prom 01/27/2021, 9:56 AM

## 2021-01-27 NOTE — Consult Note (Addendum)
Neurology Consultation Reason for Consult: Stroke Requesting Physician: Lacretia Nicksaldwell Powell   CC: Numbness and tingling on the left side, imbalance  History is obtained from: Patient and chart review  HPI: Isaac Stokes is a 70 y.o. male with a past medical history significant for stroke (2003), tobacco use (1/4 ppd M-F and 1/2 ppd Sat-Sun currently, down from 45 cigarettes daily previously).  He had gone to bed at 2 AM after watching TV and woke up at 9 AM on 5/31 with tingling and numbness of the left side of his body and drifting towards the left when he was walking.  He initially returned to bed hoping the symptoms would resolve but they were still present when he woke again around noon, prompting him to come to the ED for further evaluation.  He additionally had a stroke many years ago and is uncertain of what symptoms he had at the time but reports he thinks he had some right-sided numbness which did not fully returned to normal  LKW: 2 AM on 5/31 tPA given?: No, due to out of the window Premorbid modified rankin scale:      1 - No significant disability. Able to carry out all usual activities, despite some symptoms.  ROS: All other review of systems was negative except as noted in the HPI.   Past Medical History:  Diagnosis Date  . TIA (transient ischemic attack)    History reviewed. No pertinent surgical history.  Family history he reports both his parents had strokes, with his father dying of a intracranial hemorrhage.  Had a sister passed away of ovarian cancer.  -Patient additionally takes baby aspirin daily, full medication reconciliation has not been completed by pharmacy at the time of this note   Current Facility-Administered Medications:  .   stroke: mapping our early stages of recovery book, , Does not apply, Once, [name], [name], MD .  0.9 %  sodium chloride infusion, , Intravenous, Continuous, [name], [name], MD, Last Rate: 125 mL/hr at 01/27/21 0516, New  Bag at 01/27/21 0516 .  acetaminophen (TYLENOL) tablet 650 mg, 650 mg, Oral, Q4H PRN **OR** acetaminophen (TYLENOL) 160 MG/5ML solution 650 mg, 650 mg, Per Tube, Q4H PRN **OR** acetaminophen (TYLENOL) suppository 650 mg, 650 mg, Rectal, Q4H PRN, [name], [name], MD .  aspirin chewable tablet 81 mg, 81 mg, Oral, Daily, [name], A Caldwell Jr., MD, 81 mg at 01/27/21 0927 .  atorvastatin (LIPITOR) tablet 40 mg, 40 mg, Oral, Daily, [name], A Caldwell Jr., MD, 40 mg at 01/27/21 0927 .  enoxaparin (LOVENOX) injection 40 mg, 40 mg, Subcutaneous, Q24H, [name], [name], MD, 40 mg at 01/27/21 [id number] .  nicotine (NICODERM CQ - dosed in mg/24 hr) patch 7 mg, 7 mg, Transdermal, Daily, [name], [name], MD .  sodium chloride flush (NS) 0.9 % injection 3 mL, 3 mL, Intravenous, Once, [name], [name], MD  Current Outpatient Medications:  .  acetaminophen (TYLENOL) 500 MG tablet, Take 1,000 mg by mouth in the morning and at bedtime., Disp: , Rfl:  .  ciclopirox (PENLAC) 8 % solution, Apply topically at bedtime. Apply over nail and surrounding skin. Apply daily over previous coat. After seven (7) days, may remove with alcohol and continue cycle., Disp: 6.6 mL, Rfl: 2   Social History:  reports that he has been smoking cigarettes. He has never used smokeless tobacco. He reports that he does not drink alcohol and does not use drugs. He rolls his own cigarettes and reports they are his only friend.  He  is precontemplative about stopping smoking but would consider cutting back further.  Denies any other substance use  Exam: Current vital signs: BP (!) 144/85 (BP Location: Right Arm)   Pulse 69   Temp 98.1 F (36.7 C) (Oral)   Resp 15   Ht 6' (1.829 m)   Wt 76.2 kg   SpO2 99%   BMI 22.78 kg/m  Vital signs in last 24 hours: Temp:  [97.6 F (36.4 C)-98.5 F (36.9 C)] 98.1 F (36.7 C) (06/01 0802) Pulse Rate:  [49-86] 69 (06/01 0928) Resp:  [12-21] 15 (06/01 0928) BP: (125-167)/(62-102) 144/85 (06/01  0928) SpO2:  [96 %-100 %] 99 % (06/01 0928) Weight:  [76.2 kg] 76.2 kg (05/31 1402)   General Examination:                                                                                                  Physical Exam  Constitutional: Appears well-developed and well-nourished.  Psych: Affect appropriate to situation, calm and cooperative Eyes: No scleral injection HENT: No OP obstruction, mildly poor dentition MSK: no joint deformities.  Cardiovascular: Normal rate and regular rhythm.  Respiratory: Effort normal, non-labored breathing GI: Soft.  No distension. There is no tenderness.  Skin: WDI visible skin  Neurological Examination Mental Status: AAOx4, follows commands ( two fingers, close eyes/open, thumbs up)  Speech: Fluent with repetition and naming intact  Cranial Nerves: PERRLA, EOMI, VFF, Face symmetric on activation but slightly delayed on the left side, Tongue midline, Shoulder shrug intact  Motor:  Normal bulk and tone. Subtle left upper extremity drift, strength 5/5 throughout except for 3/5 left hip flexion and 4/5 left foot dorsiflexion Sensory: Intact to light touch throughout, states that he feels less on the left face arm and leg relative to the right Deep Tendon Reflexes: 2+ on the right, 3+ on the left biceps and patella Coordination: Ataxia of the left upper and lower extremity, mild and reach tremor in the right side Gait: Deferred  NIHSS 4 for left upper extremity drift, left arm and leg ataxia and left-sided loss of sensation   Pertinent Labs and Imaging   Basic Metabolic Panel: Recent Labs  Lab 01/26/21 1404 01/26/21 1505 01/27/21 0310  NA 138 140 136  K 4.2 4.2 3.6  CL 107 109 103  CO2 22  --  24  GLUCOSE 105* 102* 80  BUN 28* 29* 25*  CREATININE 1.70* 1.70* 1.51*  CALCIUM 9.0  --  8.7*    CBC: Recent Labs  Lab 01/26/21 1404 01/26/21 1505  WBC 6.9  --   NEUTROABS 4.5  --   HGB 12.8* 12.9*  HCT 40.4 38.0*  MCV 96.4  --   PLT 239  --      Coagulation Studies: Recent Labs    01/26/21 1404  LABPROT 13.7  INR 1.1    No results found for: HGBA1C (in process)  Lab Results  Component Value Date   CHOL 234 (H) 01/27/2021   HDL 40 (L) 01/27/2021   LDLCALC 166 (H) 01/27/2021   TRIG 140 01/27/2021   CHOLHDL 5.9 01/27/2021  UA negative for infection; UDS negative  MRI Brain WO Contrast 01/16/21  1. 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 No hemorrhage or mass effect. 2. 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 3. Chronic small vessel ischemic changes and atrophy.  MR Angio Head WO Contrast 01/26/21 Posterior Circulation:  --Vertebral arteries: Normal --Inferior cerebellar arteries: Normal. --Basilar artery: Normal. --Superior cerebellar arteries: Normal. 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 Normal right PCA. Anterior Circulation:  --Intracranial internal carotid arteries: Normal. --Anterior cerebral arteries (ACA): Normal. --Middle cerebral arteries (MCA): Normal. ANATOMIC VARIANTS: None  Echocardiogram 01/27/21 1. Left ventricular ejection fraction, by estimation, is 55%. The left ventricle has normal function. The left ventricle has no regional wall motion abnormalities. Left ventricular diastolic parameters are consistent with Grade I diastolic dysfunction (impaired relaxation).  (Notably known left ventricular hypertrophy) 2. Right ventricular systolic function is normal. The right ventricular size is normal. Tricuspid regurgitation signal is inadequate for assessing PA pressure.  3. The mitral valve is normal in structure. Mild mitral valve regurgitation. No evidence of mitral stenosis.  4. The aortic valve is tricuspid. Aortic valve regurgitation is mild. No aortic stenosis is present.  5. Aortic dilatation noted. There is mild dilatation of the aortic root, measuring 40 mm.  6. The inferior vena cava is normal in size with  greater than 50% respiratory variability, suggesting right atrial pressure of 3 mmHg.  Conclusion(s)/Recommendation(s): No intracardiac source of embolism detected on this transthoracic study. A transesophageal echocardiogram is  recommended to exclude cardiac source of embolism if clinically indicated. Marland KitchenMarland KitchenMarland KitchenLeft Atrium: Left atrial size was normal in size.  Right Atrium: Right atrial size was normal in size... IAS/Shunts: No atrial level shunt detected by color flow Doppler.   Carotid US 01/27/21 Right Carotid: The extracranial vessels were near-normal with only minimal wall thickening or plaque.  Left Carotid: The extracranial vessels were near-normal with only minimal wall thickening or plaque.  Vertebrals: Right vertebral artery demonstrates retrograde flow. Left vertebral artery demonstrates high resistant flow.  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   Assessment and Plan: Isaac Stokes is a 70 year old male w/pmh of prior stroke, tobacco use who presents with left sided numbness, tingling, left sided ataxia and mild weakness, found to have a small right thalamic stroke. Stroke etiology is felt to be in the setting of small vessel disease, versus atheroembolic from posterior circulation atherosclerosis. Stroke risk factors include tobacco use, hyperlipidemia and questionable hypertension ( denies prior diagnosis however pressures are elevated in the 150-160 range, absent LVH on echocardiogram)   Recommendations:   # Lateral right thalamic stroke likely small vessel disease versus atheroembolic from posterior circulation atherosclerosis - LDL is 166, recommend staring Atorvastatin 40 mg nightly for secondary stroke prevention, increase to 80 if needed for LDL goal less than 70 - DAPT for 21 days followed by Aspirin monotherapy (loaded with 300 mg Plavix, 75 mg daily x 21 days ordered) - Risk factor modification counseling, smoking cessation, diet, exercise - Telemetry  monitoring; given no evidence of structural heart disease on echocardiogram no need for event monitor on discharge at this time especially given stroke does not appear cardioembolic on MRI - Frequent neuro checks - PCP to follow-up hemoglobin A1c, goal less than 7% - Ready for discharge after PT/OT evaluation  - Outpatient neuro follow up at discharge, close blood pressure monitoring and initiation of antihypertensive agent for goal normotension long-term if pressures remain elevated -Neurology  will be available on an as-needed basis going forward, please reach out if new questions or concerns arise  Note compiled with assistance of NP Stark Jock  Attending Neurologist's note:  I personally saw this patient, gathering history, performing a full neurologic examination, reviewing relevant labs, personally reviewing relevant imaging including MRI brain, MRA, and formulated the assessment and plan, adding the note above for completeness and clarity to accurately reflect my thoughts   Brooke Dare MD-PhD [facility] [phone] Available 7 AM to 7 PM, outside these hours please contact Neurologist on call listed on AMION

## 2021-01-27 NOTE — Progress Notes (Signed)
PROGRESS NOTE    Isaac Stokes  XBJ:[id number] DOB: 1951/04/01 DOA: 01/26/2021 PCP: Pcp, No   Chief Complaint  Patient presents with  . Numbness  . Extremity Weakness   Brief Narrative:  Isaac Stokes is Isaac Stokes 70 y.o. male with medical history significant of CVA, tobacco use presented to the ED with complaints of numbness and tingling on the left side of his body, gait instability which started at 9 AM, LKN at 2 am.  No significant lab abnormalities except borderline anemia and creatinine 1.7.  CT head negative for acute intracranial abnormality.  Brain MRI/MRA head showing Madason Rauls small acute infarct of the lateral right thalamus.  No hemorrhage or mass-effect. 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 Neurology Dr. Otelia Limes consulted.  Assessment & Plan:   Principal Problem:   Acute CVA (cerebrovascular accident) [facility]) Active Problems:   Renal insufficiency   Polyuria   Chest pain   Tobacco use  Acute CVA MRI brain with small acute infarct of the lateral R thalamus, L PCA occlusion at the P1-2 junction wih old L occipital infarct -Neurology consulted, appreciate assistance - thought 2/2 small vessel disease (vs atheroembolic from posterior circulation atherosclerosis) - DAPT x 21 days, then aspirin alone - atorvastatin 40 mg daily  -Telemetry monitoring - no need for event monitor on d/c -Allow for permissive hypertension-treat only if SBP >220 or DBP >120 - PT/OT/SLP -Transthoracic echocardiogram with normal EF, see below -Hemoglobin A1c, fasting lipid panel (LDL 166, HDL 40) -Frequent neurochecks -PT, OT, speech therapy.  Renal insufficiency Creatinine 1.7.  Unclear whether this is acute or chronic as there are no prior labs for comparison. -IV fluid hydration, monitor BMP, avoid nephrotoxic agents  Polyuria, polydipsia -Check A1c, UA (bland)  Chest pain Ongoing for the past year and appears to be nonexertional.  Most recent episode of  chest pain was 2 weeks ago.  EKG without acute ischemic changes.  No chest pain at present. -Echocardiogram without WMA, normal EF (see report).  Will need cardiology follow-up.  Tobacco use -NicoDerm patch and counseling  DVT prophylaxis: lovenox Code Status: full  Family Communication: none at bedside Disposition:   Status is: Inpatient  Remains inpatient appropriate because:Inpatient level of care appropriate due to severity of illness   Dispo: The patient is from: Home              Anticipated d/c is to: Home              Patient currently is not medically stable to d/c.   Difficult to place patient No       Consultants:   neurology  Procedures:  Echo IMPRESSIONS    1. Left ventricular ejection fraction, by estimation, is 55%. The left  ventricle has normal function. The left ventricle has no regional wall  motion abnormalities. Left ventricular diastolic parameters are consistent  with Grade I diastolic dysfunction  (impaired relaxation).  2. Right ventricular systolic function is normal. The right ventricular  size is normal. Tricuspid regurgitation signal is inadequate for assessing  PA pressure.  3. The mitral valve is normal in structure. Mild mitral valve  regurgitation. No evidence of mitral stenosis.  4. The aortic valve is tricuspid. Aortic valve regurgitation is mild. No  aortic stenosis is present.  5. Aortic dilatation noted. There is mild dilatation of the aortic root,  measuring 40 mm.  6. The inferior vena cava is normal in size with greater than 50%  respiratory variability, suggesting right atrial pressure of 3 mmHg.   Conclusion(s)/Recommendation(s): No intracardiac source of embolism  detected on this transthoracic study. Cassidy Tashiro transesophageal echocardiogram is  recommended to exclude cardiac source of embolism if clinically indicated  Carotid US Summary:  Right Carotid: The extracranial vessels were near-normal with only minimal   wall         thickening or plaque.   Left Carotid: The extracranial vessels were near-normal with only minimal  wall        thickening or plaque.   Vertebrals: Right vertebral artery demonstrates retrograde flow. Left  vertebral        artery demonstrates high resistant flow.  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   *See table(s) above for measurements and observations.   Antimicrobials:  Anti-infectives (From admission, onward)   None        Subjective: No new complaints  Objective: Vitals:   01/27/21 1450 01/27/21 1500 01/27/21 1624 01/27/21 1704  BP: (!) 145/87 (!) 140/93 138/75 134/89  Pulse: 83 65 68 70  Resp: (!) Temp:   98 F (36.7 C) 98.5 F (36.9 C)  TempSrc:   Oral Axillary  SpO2: 98% 96% 99% 100%  Weight:      Height:        Intake/Output Summary (Last 24 hours) at 01/27/2021 1941 Last data filed at 01/27/2021 1938 Gross per 24 hour  Intake 320 ml  Output 550 ml  Net -230 ml   Filed Weights   01/26/21 1402  Weight: 76.2 kg    Examination:  General exam: Appears calm and comfortable  Respiratory system: Clear to auscultation. Respiratory effort normal. Cardiovascular system: S1 & S2 heard, RRR.  Gastrointestinal system: Abdomen is nondistended, soft and nontender.  Central nervous system: Alert and oriented. L sided decreased sensation to light touch.  Symmetric strength. Extremities: no LEE Skin: No rashes, lesions or ulcers Psychiatry: Judgement and insight appear normal. Mood & affect appropriate.     Data Reviewed: I have personally reviewed following labs and imaging studies  CBC: Recent Labs  Lab 01/26/21 1404 01/26/21 1505  WBC 6.9  --   NEUTROABS 4.5  --   HGB 12.8* 12.9*  HCT 40.4 38.0*  MCV 96.4  --   PLT 239  --     Basic Metabolic Panel: Recent Labs  Lab 01/26/21 1404 01/26/21 1505 01/27/21 0310  NA 138 140 136  K 4.2 4.2 3.6  CL 107 109 103  CO2 22  --   24  GLUCOSE 105* 102* 80  BUN 28* 29* 25*  CREATININE 1.70* 1.70* 1.51*  CALCIUM 9.0  --  8.7*    GFR: Estimated Creatinine Clearance: 48.4 mL/min (Charlesia Canaday) (by C-G formula based on SCr of 1.51 mg/dL (H)).  Liver Function Tests: Recent Labs  Lab 01/26/21 1404  AST 20  ALT 15  ALKPHOS 45  BILITOT 0.5  PROT 7.2  ALBUMIN 3.8    CBG: Recent Labs  Lab 01/26/21 1722  GLUCAP 92     Recent Results (from the past 240 hour(s))  Resp Panel by RT-PCR (Flu Korrin Waterfield&B, Covid) Nasopharyngeal Swab     Status: None   Collection Time: 01/26/21  8:54 PM   Specimen: Nasopharyngeal Swab; Nasopharyngeal(NP) swabs in vial transport medium  Result Value Ref Range Status   SARS Coronavirus 2 by RT PCR NEGATIVE NEGATIVE Final    Comment: (NOTE) SARS-CoV-2 target nucleic acids are NOT DETECTED.  The SARS-CoV-2 RNA is generally  detectable in upper respiratory specimens during the acute phase of infection. The lowest concentration of SARS-CoV-2 viral copies this assay can detect is 138 copies/mL. Jontay Maston negative result does not preclude SARS-Cov-2 infection and should not be used as the sole basis for treatment or other patient management decisions. Lorriann Hansmann negative result may occur with  improper specimen collection/handling, submission of specimen other than nasopharyngeal swab, presence of viral mutation(s) within the areas targeted by this assay, and inadequate number of viral copies(<138 copies/mL). Jairo Bellew negative result must be combined with clinical observations, patient history, and epidemiological information. The expected result is Negative.  Fact Sheet for Patients:  BloggerCourse.com  Fact Sheet for Healthcare Providers:  SeriousBroker.it  This test is no t yet approved or cleared by the [place] FDA and  has been authorized for detection and/or diagnosis of SARS-CoV-2 by FDA under an Emergency Use Authorization (EUA). This EUA will remain  in  effect (meaning this test can be used) for the duration of the COVID-19 declaration under Section 564(b)(1) of the Act, 21 U.S.C.section 360bbb-3(b)(1), unless the authorization is terminated  or revoked sooner.       Influenza Elma Limas by PCR NEGATIVE NEGATIVE Final   Influenza B by PCR NEGATIVE NEGATIVE Final    Comment: (NOTE) The Xpert Xpress SARS-CoV-2/FLU/RSV plus assay is intended as an aid in the diagnosis of influenza from Nasopharyngeal swab specimens and should not be used as Cameren Earnest sole basis for treatment. Nasal washings and aspirates are unacceptable for Xpert Xpress SARS-CoV-2/FLU/RSV testing.  Fact Sheet for Patients: BloggerCourse.com  Fact Sheet for Healthcare Providers: SeriousBroker.it  This test is not yet approved or cleared by the [place] FDA and has been authorized for detection and/or diagnosis of SARS-CoV-2 by FDA under an Emergency Use Authorization (EUA). This EUA will remain in effect (meaning this test can be used) for the duration of the COVID-19 declaration under Section 564(b)(1) of the Act, 21 U.S.C. section 360bbb-3(b)(1), unless the authorization is terminated or revoked.  Performed at [facility], [address], [place], [place] [address]          Radiology Studies: CT HEAD WO CONTRAST  Result Date: 01/26/2021 CLINICAL DATA:  Weakness EXAM: CT HEAD WITHOUT CONTRAST TECHNIQUE: Contiguous axial images were obtained from the base of the skull through the vertex without intravenous contrast. COMPARISON:  None. FINDINGS: Brain: No evidence of acute infarction, hemorrhage, hydrocephalus, extra-axial collection or mass lesion/mass effect. Chronic white matter ischemic change is seen. Additionally prior infarcts are noted in the anterior limb of the internal capsule on the right as well as in the left occipital lobe Vascular: No hyperdense vessel or unexpected calcification. Skull: Normal.  Negative for fracture or focal lesion. Sinuses/Orbits: No acute finding. Other: None. IMPRESSION: Chronic ischemic changes are noted without acute abnormality. Electronically Signed   By: Alcide Clever M.D.   On: 01/26/2021 15:08   MR ANGIO HEAD WO CONTRAST  Result Date: 01/26/2021 CLINICAL DATA:  Left-sided tingling and numbness EXAM: MRI HEAD WITHOUT CONTRAST MRA HEAD WITHOUT CONTRAST TECHNIQUE: Multiplanar, multi-echo pulse sequences of the brain and surrounding structures were acquired without intravenous contrast. Angiographic images of the Circle of Willis were acquired using MRA technique without intravenous contrast. COMPARISON: No pertinent prior exam. COMPARISON:  No pertinent prior exam. FINDINGS: MRI HEAD FINDINGS Brain: Small acute infarct of the lateral right thalamus. No acute or chronic hemorrhage. There is multifocal hyperintense T2-weighted signal within the white matter. Generalized volume loss without Arval Brandstetter clear lobar predilection.  Old left occipital infarct. The midline structures are normal. Vascular: Major flow voids are preserved. Skull and upper cervical spine: Normal calvarium and skull base. Visualized upper cervical spine and soft tissues are normal. Sinuses/Orbits:No paranasal sinus fluid levels or advanced mucosal thickening. No mastoid or middle ear effusion. Normal orbits. MRA HEAD FINDINGS POSTERIOR CIRCULATION: --Vertebral arteries: Normal --Inferior cerebellar arteries: Normal. --Basilar artery: Normal. --Superior cerebellar arteries: Normal. 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 Normal right PCA. ANTERIOR CIRCULATION: --Intracranial internal carotid arteries: Normal. --Anterior cerebral arteries (ACA): Normal. --Middle cerebral arteries (MCA): Normal. ANATOMIC VARIANTS: None IMPRESSION: 1. 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 No hemorrhage or mass effect. 2. 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 3.  Chronic small vessel ischemic changes and atrophy. Electronically Signed   By: Deatra Robinson M.D.   On: 01/26/2021 21:16   MR BRAIN WO CONTRAST  Result Date: 01/26/2021 CLINICAL DATA:  Left-sided tingling and numbness EXAM: MRI HEAD WITHOUT CONTRAST MRA HEAD WITHOUT CONTRAST TECHNIQUE: Multiplanar, multi-echo pulse sequences of the brain and surrounding structures were acquired without intravenous contrast. Angiographic images of the Circle of Willis were acquired using MRA technique without intravenous contrast. COMPARISON: No pertinent prior exam. COMPARISON:  No pertinent prior exam. FINDINGS: MRI HEAD FINDINGS Brain: Small acute infarct of the lateral right thalamus. No acute or chronic hemorrhage. There is multifocal hyperintense T2-weighted signal within the white matter. Generalized volume loss without Neelie Welshans clear lobar predilection. Old left occipital infarct. The midline structures are normal. Vascular: Major flow voids are preserved. Skull and upper cervical spine: Normal calvarium and skull base. Visualized upper cervical spine and soft tissues are normal. Sinuses/Orbits:No paranasal sinus fluid levels or advanced mucosal thickening. No mastoid or middle ear effusion. Normal orbits. MRA HEAD FINDINGS POSTERIOR CIRCULATION: --Vertebral arteries: Normal --Inferior cerebellar arteries: Normal. --Basilar artery: Normal. --Superior cerebellar arteries: Normal. 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 Normal right PCA. ANTERIOR CIRCULATION: --Intracranial internal carotid arteries: Normal. --Anterior cerebral arteries (ACA): Normal. --Middle cerebral arteries (MCA): Normal. ANATOMIC VARIANTS: None IMPRESSION: 1. 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 No hemorrhage or mass effect. 2. 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 3. Chronic small vessel ischemic changes and atrophy. Electronically Signed   By: Deatra Robinson M.D.   On:  01/26/2021 21:16   ECHOCARDIOGRAM COMPLETE  Result Date: 01/27/2021    ECHOCARDIOGRAM REPORT   Patient Name:   CURRIE DENNIN Date of Exam: 01/27/2021 Medical Rec #:  [id number]        Height:       72.0 in Accession #:    [id number]       Weight:       168.0 lb Date of Birth:  27-Mar-1951        BSA:          1.978 m Patient Age:    69 years         BP:           144/85 mmHg Patient Gender: M                HR:           63 bpm. Exam Location:  Inpatient Procedure: 2D Echo, Cardiac Doppler and Color Doppler Indications:    Stroke I63.9  History:        Patient has no prior history of Echocardiogram examinations.  TIA.  Sonographer:    Tiffany Dance Referring Phys: [id number] [name] RATHORE IMPRESSIONS  1. Left ventricular ejection fraction, by estimation, is 55%. The left ventricle has normal function. The left ventricle has no regional wall motion abnormalities. Left ventricular diastolic parameters are consistent with Grade I diastolic dysfunction (impaired relaxation).  2. Right ventricular systolic function is normal. The right ventricular size is normal. Tricuspid regurgitation signal is inadequate for assessing PA pressure.  3. The mitral valve is normal in structure. Mild mitral valve regurgitation. No evidence of mitral stenosis.  4. The aortic valve is tricuspid. Aortic valve regurgitation is mild. No aortic stenosis is present.  5. Aortic dilatation noted. There is mild dilatation of the aortic root, measuring 40 mm.  6. The inferior vena cava is normal in size with greater than 50% respiratory variability, suggesting right atrial pressure of 3 mmHg. Conclusion(s)/Recommendation(s): No intracardiac source of embolism detected on this transthoracic study. Kayde Atkerson transesophageal echocardiogram is recommended to exclude cardiac source of embolism if clinically indicated. FINDINGS  Left Ventricle: Left ventricular ejection fraction, by estimation, is 55%. The left ventricle has normal function. The  left ventricle has no regional wall motion abnormalities. The left ventricular internal cavity size was normal in size. There is no left ventricular hypertrophy. Left ventricular diastolic parameters are consistent with Grade I diastolic dysfunction (impaired relaxation). Right Ventricle: The right ventricular size is normal. No increase in right ventricular wall thickness. Right ventricular systolic function is normal. Tricuspid regurgitation signal is inadequate for assessing PA pressure. Left Atrium: Left atrial size was normal in size. Right Atrium: Right atrial size was normal in size. Pericardium: There is no evidence of pericardial effusion. Mitral Valve: The mitral valve is normal in structure. Mild mitral valve regurgitation. No evidence of mitral valve stenosis. Tricuspid Valve: The tricuspid valve is normal in structure. Tricuspid valve regurgitation is trivial. No evidence of tricuspid stenosis. Aortic Valve: The aortic valve is tricuspid. Aortic valve regurgitation is mild. Aortic regurgitation PHT measures 497 msec. No aortic stenosis is present. Pulmonic Valve: The pulmonic valve was normal in structure. Pulmonic valve regurgitation is trivial. No evidence of pulmonic stenosis. Aorta: Aortic dilatation noted. There is mild dilatation of the aortic root, measuring 40 mm. Venous: The inferior vena cava is normal in size with greater than 50% respiratory variability, suggesting right atrial pressure of 3 mmHg. IAS/Shunts: No atrial level shunt detected by color flow Doppler.  LEFT VENTRICLE PLAX 2D LVIDd:         4.10 cm  Diastology LVIDs:         2.90 cm  LV e' medial:    6.64 cm/s LV PW:         1.00 cm  LV E/e' medial:  9.2 LV IVS:        1.00 cm  LV e' lateral:   8.92 cm/s LVOT diam:     2.20 cm  LV E/e' lateral: 6.9 LV SV:         93 LV SV Index:   47 LVOT Area:     3.80 cm  RIGHT VENTRICLE             IVC RV Basal diam:  2.30 cm     IVC diam: 1.80 cm RV S prime:     12.60 cm/s TAPSE (M-mode): 1.7  cm LEFT ATRIUM             Index       RIGHT ATRIUM  Index LA diam:        2.90 cm 1.47 cm/m  RA Area:     11.60 cm LA Vol (A2C):   59.4 ml 30.02 ml/m RA Volume:   25.30 ml  12.79 ml/m LA Vol (A4C):   27.5 ml 13.90 ml/m LA Biplane Vol: 41.7 ml 21.08 ml/m  AORTIC VALVE LVOT Vmax:   122.00 cm/s LVOT Vmean:  77.600 cm/s LVOT VTI:    0.245 m AI PHT:      497 msec  AORTA Ao Root diam: 4.00 cm Ao Asc diam:  3.30 cm MITRAL VALVE MV Area (PHT): 2.42 cm    SHUNTS MV Decel Time: 313 msec    Systemic VTI:  0.24 m MV E velocity: 61.30 cm/s  Systemic Diam: 2.20 cm MV Veleda Mun velocity: 81.80 cm/s MV E/Jaeline Whobrey ratio:  0.75 Weston Brass MD Electronically signed by Weston Brass MD Signature Date/Time: 01/27/2021/11:40:02 AM    Final    VAS US CAROTID  Result Date: 01/27/2021 Carotid Arterial Duplex Study Patient Name:  [name]  Date of Exam:   01/27/2021 Medical Rec #: [id number]         Accession #:    [id number] Date of Birth: Mar 18, 1951         Patient Gender: M Patient Age:   69Y Exam Location:  [facility] Procedure:      VAS US CAROTID Referring Phys: [id number] SRISHTI L BHAGAT --------------------------------------------------------------------------------  Indications:       Numbness and unsteady gait. Risk Factors:      Current smoker, prior CVA. Comparison Study:  No previous exams Performing Technologist: Jody Hill RVT, RDMS  Examination Guidelines: Wilson Dusenbery complete evaluation includes B-mode imaging, spectral Doppler, color Doppler, and power Doppler as needed of all accessible portions of each vessel. Bilateral testing is considered an integral part of Telesa Jeancharles complete examination. Limited examinations for reoccurring indications may be performed as noted.  Right Carotid Findings: +----------+--------+--------+--------+------------------+------------------+           PSV cm/sEDV cm/sStenosisPlaque DescriptionComments            +----------+--------+--------+--------+------------------+------------------+ CCA Prox  119     13                                intimal thickening +----------+--------+--------+--------+------------------+------------------+ CCA Distal95      20                                intimal thickening +----------+--------+--------+--------+------------------+------------------+ ICA Prox  64      19                                                   +----------+--------+--------+--------+------------------+------------------+ ICA Distal66      21                                                   +----------+--------+--------+--------+------------------+------------------+ ECA       62      7                                                    +----------+--------+--------+--------+------------------+------------------+ +----------+--------+-------+--------+-------------------+  PSV cm/sEDV cmsDescribeArm Pressure (mmHG) +----------+--------+-------+--------+-------------------+ [id number]            Stenotic                    +----------+--------+-------+--------+-------------------+ +---------+--------+--+--------+-+----------+ VertebralPSV cm/s37EDV cm/s0Retrograde +---------+--------+--+--------+-+----------+  Left Carotid Findings: +----------+--------+--------+--------+------------------+------------------+           PSV cm/sEDV cm/sStenosisPlaque DescriptionComments           +----------+--------+--------+--------+------------------+------------------+ CCA Prox  59      11                                intimal thickening +----------+--------+--------+--------+------------------+------------------+ CCA Distal87      22                                intimal thickening +----------+--------+--------+--------+------------------+------------------+ ICA Prox  63      18                                                    +----------+--------+--------+--------+------------------+------------------+ ICA Distal80      24                                                   +----------+--------+--------+--------+------------------+------------------+ ECA       74      0                                                    +----------+--------+--------+--------+------------------+------------------+ +----------+--------+--------+--------+-------------------+           PSV cm/sEDV cm/sDescribeArm Pressure (mmHG) +----------+--------+--------+--------+-------------------+ Subclavian201             Stenotic                    +----------+--------+--------+--------+-------------------+ +---------+--------+--+--------+-+--------------+ VertebralPSV cm/s67EDV cm/s0High resistant +---------+--------+--+--------+-+--------------+   Summary: Right Carotid: The extracranial vessels were near-normal with only minimal wall                thickening or plaque. Left Carotid: The extracranial vessels were near-normal with only minimal wall               thickening or plaque. Vertebrals:  Right vertebral artery demonstrates retrograde flow. Left vertebral              artery demonstrates high resistant flow. 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 *See table(s) above for measurements and observations.     Preliminary         Scheduled Meds: .  stroke: mapping our early stages of recovery book   Does not apply Once  . aspirin  81 mg Oral Daily  . [START ON 01/28/2021] atorvastatin  40 mg Oral QHS  . [START ON 01/28/2021] clopidogrel  75 mg Oral Daily  . enoxaparin (LOVENOX) injection  40 mg Subcutaneous Q24H  . nicotine  7 mg Transdermal Daily  . sodium chloride flush  3 mL Intravenous Once   Continuous Infusions: . sodium  chloride 125 mL/hr at 01/27/21 1159     LOS: 1 day    Time spent: over 30 min    Lacretia Nicks, MD [facility]   To contact the attending provider  between 7A-7P or the covering provider during after hours 7P-7A, please log into the web site [email or web address] and access using universal [place] password for that web site. If you do not have the password, please call the hospital operator.  01/27/2021, 7:41 PM

## 2021-01-27 NOTE — Progress Notes (Signed)
  Echocardiogram 2D Echocardiogram has been performed.  Isaac Stokes [name] 01/27/2021, 10:09 AM

## 2021-01-27 NOTE — Consult Note (Deleted)
Requesting Physician: Dr. Lowell Guitar Chief Complaint: R Thalamic Stroke   History obtained from:  Patient and Chart Review   HPI: Isaac Stokes is an 70 y.o. male w/pmh of prior stroke, tobacco use   Date last known well: 01/26/21 Time last known well: 0200  tPA Given: No, was outside the time window for IVTPA  MRS: 1           Past Medical History:  Diagnosis Date  . TIA (transient ischemic attack)     History reviewed. No pertinent surgical history.  History reviewed. No pertinent family history.       Social History:  reports that he has been smoking cigarettes. He has never used smokeless tobacco. He reports that he does not drink alcohol and does not use drugs.  Allergies: No Known Allergies  Medications:                                                                                                                           Takes Aspirin 81 mg QD and Tylenol PRN at home   ROS:                                                                                                                                       History obtained from the patient  General ROS: negative for - chills, fatigue, fever, night sweats, weight gain or weight loss Psychological ROS: negative for - , hallucinations, memory difficulties, mood swings or  Ophthalmic ROS: negative for - blurry vision, double vision, eye pain or loss of vision ENT ROS: negative for - epistaxis, nasal discharge, oral lesions, sore throat, tinnitus or vertigo Respiratory ROS: negative for - cough,  shortness of breath or wheezing Cardiovascular ROS: negative for - chest pain, dyspnea on exertion,  Gastrointestinal ROS: negative for - abdominal pain, diarrhea,  nausea/vomiting or stool incontinence Genito-Urinary ROS: negative for - dysuria, hematuria, incontinence or urinary frequency/urgency Musculoskeletal ROS: negative for - joint swelling or muscular weakness Neurological ROS: as noted in HPI   General Examination:  Constitutional: Elderly Caucasian male sitting up in bed  Cardiovascular: RRR  Respiratory: Unlabored respirations   Neurological Examination Mental Status: AAOx4, follows commands ( two fingers, close eyes/open, thumbs up)  Speech: Fluent with repetition and naming intact  Cranial Nerves: EOMI, VFF, Face symmetric, Tongue midline, Shoulder shrug intact  Motor:  Normal bulk and tone. Subtle left upper extremity drift, strength 5/5 throughout  Sensory: Intact to light touch throughout, states that he feels more to the left forearm when compared to the right forearm  Deep Tendon Reflexes: Deferred Coordination: Intact FNF  Gait: Deferred  NIHSS 1 for left upper extremity drift    Pertinent Labs and Imaging  MRI Brain WO Contrast 01/16/21  1. 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 No hemorrhage or mass effect. 2. 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 3. Chronic small vessel ischemic changes and atrophy.  MR Angio Head WO Contrast 01/26/21  Posterior Circulation:  --Vertebral arteries: Normal --Inferior cerebellar arteries: Normal. --Basilar artery: Normal. --Superior cerebellar arteries: Normal. 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 Normal right PCA.  Anterior Circulation:  --Intracranial internal carotid arteries: Normal. --Anterior cerebral arteries (ACA): Normal. --Middle cerebral arteries (MCA): Normal.  ANATOMIC VARIANTS: None  Echocardiogram 01/27/21 Results pending   Carotid US 01/27/21 Results pending   Assessment and Plan: Isaac Stokes is a 70 year old male w/pmh of prior stroke, tobacco use who presents with left sided numbness, tingling, gait instability found to have a small right thalamic stroke. Stroke etiology is felt to be in the setting of small vessel disease. Stroke risk factors  include tobacco use, hyperlipidemia and questionable hypertension ( denies prior diagnosis however pressures are elevated in the 150-160 range)   Recommendations:  - Follow up remaining stroke work up- Echo, CUS, A1C lab  - LDL is 166, recommend staring Atorvastatin 40 mg for secondary stroke prevention  - DAPT for 21 days followed by Aspirin monotherapy  --Telemetry monitoring --Frequent neuro checks -- Outpatient neuro follow up at discharge   Please page stroke NP, PA or MD from 8am -4 pm as patient will be followed by the stroke team here on out. Coverage can be viewed in Amion.  Courtney Heard-NP  [facility] 01/27/2021, 11:04 AM

## 2021-01-27 NOTE — Progress Notes (Signed)
Carotid duplex has been completed.  Results can be found under chart review under CV PROC. 01/27/2021 11:32 AM Burnham Trost RVT, RDMS

## 2021-01-28 DIAGNOSIS — I639 Cerebral infarction, unspecified: Secondary | ICD-10-CM | POA: Diagnosis not present

## 2021-01-28 LAB — CBC WITH DIFFERENTIAL/PLATELET
Abs Immature Granulocytes: 0.03 10*3/uL (ref 0.00–0.07)
Basophils Absolute: 0.1 10*3/uL (ref 0.0–0.1)
Basophils Relative: 1 %
Eosinophils Absolute: 0.2 10*3/uL (ref 0.0–0.5)
Eosinophils Relative: 3 %
HCT: 33.5 % — ABNORMAL LOW (ref 39.0–52.0)
Hemoglobin: 11 g/dL — ABNORMAL LOW (ref 13.0–17.0)
Immature Granulocytes: 1 %
Lymphocytes Relative: 35 %
Lymphs Abs: 2.3 10*3/uL (ref 0.7–4.0)
MCH: 30.8 pg (ref 26.0–34.0)
MCHC: 32.8 g/dL (ref 30.0–36.0)
MCV: 93.8 fL (ref 80.0–100.0)
Monocytes Absolute: 0.6 10*3/uL (ref 0.1–1.0)
Monocytes Relative: 8 %
Neutro Abs: 3.4 10*3/uL (ref 1.7–7.7)
Neutrophils Relative %: 52 %
Platelets: 183 10*3/uL (ref 150–400)
RBC: 3.57 MIL/uL — ABNORMAL LOW (ref 4.22–5.81)
RDW: 13.2 % (ref 11.5–15.5)
WBC: 6.6 10*3/uL (ref 4.0–10.5)
nRBC: 0 % (ref 0.0–0.2)

## 2021-01-28 LAB — COMPREHENSIVE METABOLIC PANEL
ALT: 14 U/L (ref 0–44)
AST: 17 U/L (ref 15–41)
Albumin: 3.1 g/dL — ABNORMAL LOW (ref 3.5–5.0)
Alkaline Phosphatase: 38 U/L (ref 38–126)
Anion gap: 7 (ref 5–15)
BUN: 26 mg/dL — ABNORMAL HIGH (ref 8–23)
CO2: 24 mmol/L (ref 22–32)
Calcium: 8.7 mg/dL — ABNORMAL LOW (ref 8.9–10.3)
Chloride: 106 mmol/L (ref 98–111)
Creatinine, Ser: 1.55 mg/dL — ABNORMAL HIGH (ref 0.61–1.24)
GFR, Estimated: 48 mL/min — ABNORMAL LOW (ref >60)
Glucose, Bld: 96 mg/dL (ref 70–99)
Potassium: 4.1 mmol/L (ref 3.5–5.1)
Sodium: 137 mmol/L (ref 135–145)
Total Bilirubin: 0.7 mg/dL (ref 0.3–1.2)
Total Protein: 5.9 g/dL — ABNORMAL LOW (ref 6.5–8.1)

## 2021-01-28 LAB — HEMOGLOBIN A1C
Hgb A1c MFr Bld: 5.7 % — ABNORMAL HIGH (ref 4.8–5.6)
Mean Plasma Glucose: 117 mg/dL

## 2021-01-28 LAB — MAGNESIUM: Magnesium: 2.1 mg/dL (ref 1.7–2.4)

## 2021-01-28 LAB — PHOSPHORUS: Phosphorus: 3.4 mg/dL (ref 2.5–4.6)

## 2021-01-28 MED ORDER — ASPIRIN 81 MG PO CHEW: 81.0000 mg | CHEWABLE_TABLET | Freq: Every day | ORAL | 1 refills | Status: AC

## 2021-01-28 MED ORDER — CLOPIDOGREL BISULFATE 75 MG PO TABS: 75.0000 mg | ORAL_TABLET | Freq: Every day | ORAL | 0 refills | Status: AC

## 2021-01-28 MED ORDER — NICOTINE 7 MG/24HR TD PT24: 7.0000 mg | MEDICATED_PATCH | Freq: Every day | TRANSDERMAL | 0 refills | Status: DC

## 2021-01-28 MED ORDER — NICOTINE POLACRILEX 2 MG MT GUM: 2.0000 mg | CHEWING_GUM | OROMUCOSAL | 0 refills | Status: DC | PRN

## 2021-01-28 MED ORDER — ATORVASTATIN CALCIUM 40 MG PO TABS: 40.0000 mg | ORAL_TABLET | Freq: Every day | ORAL | 1 refills | Status: DC

## 2021-01-28 NOTE — Evaluation (Signed)
Physical Therapy Evaluation Patient Details Name: Isaac Stokes MRN: [id number] DOB: 02/11/1951 Today's Date: 01/28/2021   History of Present Illness  Pt is 70 yo male who presented with c/o numbness on L side of body.  Admitted with acute CVA of lateral R thalamus, L PCA occlusion, and L occipital infarct on 01/26/10.  Pt with PMH including CVA  Clinical Impression  Pt admitted with above diagnosis. Pt presenting with L sided numbness and decreased mobility and balance.  He was able to ambulate 250' with min guard and no AD - did occasionally drift to L. Pt scored 17/24 on the DGI where <19 indicates higher fall risk.  Pt is normally very independent and lives alone.  Suspect pt will progress well - may need increased support initially from family at home.   Pt currently with functional limitations due to the deficits listed below (see PT Problem List). Pt will benefit from skilled PT to increase their independence and safety with mobility to allow discharge to the venue listed below.       Follow Up Recommendations Home health PT;Supervision - Intermittent    Equipment Recommendations  Cane (possible cane - will see with progress)    Recommendations for Other Services       Precautions / Restrictions Precautions Precautions: Fall      Mobility  Bed Mobility Overal bed mobility: Needs Assistance Bed Mobility: Supine to Sit     Supine to sit: Supervision          Transfers Overall transfer level: Needs assistance Equipment used: None Transfers: Sit to/from Stand Sit to Stand: Min guard         General transfer comment: min guard for safety as 1st time OOB  Ambulation/Gait Ambulation/Gait assistance: Min guard;Supervision Gait Distance (Feet): 250 Feet Assistive device: None Gait Pattern/deviations: Step-through pattern;Staggering left Gait velocity: normal   General Gait Details: Normal gait speed, mild unsteadiness to L (decreased sensation) with gait but no  overt LOB, also mild unsteadines with 360 turns in bathroom but no overt LOB  Stairs Stairs: Yes Stairs assistance: Min guard Stair Management: One rail Left;Forwards;Alternating pattern Number of Stairs: 3 General stair comments: performed with min guard for safety  Wheelchair Mobility    Modified Rankin (Stroke Patients Only) Modified Rankin (Stroke Patients Only) Pre-Morbid Rankin Score: No symptoms Modified Rankin: Moderate disability     Balance Overall balance assessment: Needs assistance   Sitting balance-Leahy Scale: Normal     Standing balance support: No upper extremity supported Standing balance-Leahy Scale: Good Standing balance comment: Some mild deficits with gait and turns               High Level Balance Comments: mild unsteadiness with 360 degree turns in bathroom Standardized Balance Assessment Standardized Balance Assessment : Dynamic Gait Index   Dynamic Gait Index Level Surface: Mild Impairment Change in Gait Speed: Normal Gait with Horizontal Head Turns: Mild Impairment Gait with Vertical Head Turns: Mild Impairment Gait and Pivot Turn: Mild Impairment Step Over Obstacle: Mild Impairment Step Around Obstacles: Mild Impairment Steps: Mild Impairment Total Score: 17       Pertinent Vitals/Pain Pain Assessment: No/denies pain    Home Living Family/patient expects to be discharged to:: Private residence Living Arrangements: Alone Available Help at Discharge: Other (Comment) (no assist currenty; reports brother could possible help some or that sometimes the apt complex brings in [employer]?) Type of Home: Apartment Home Access: Elevator     Home Layout: One level Home Equipment:  Shower seat;Grab bars - tub/shower      Prior Function Level of Independence: Independent         Comments: Pt independent with ADLs, IADLs, driving, grocery shopping, and community ambulation.     Hand Dominance        Extremity/Trunk Assessment    Upper Extremity Assessment Upper Extremity Assessment: Defer to OT evaluation (Defer to OT for overall assessment but at least demonstrating functional strength and reports decreased sensation L arm)    Lower Extremity Assessment Lower Extremity Assessment: LLE deficits/detail;RLE deficits/detail RLE Deficits / Details: ROM WFL; MMT 5/5 RLE Sensation: WNL RLE Coordination: WNL LLE Deficits / Details: ROM WFL; MMT 5/5 LLE Sensation: decreased light touch (pt was able to identify light touch but reports feels less than R and has difficulty feeling L foot with ambulation)    Cervical / Trunk Assessment Cervical / Trunk Assessment: Other exceptions Cervical / Trunk Exceptions: L face numb  Communication   Communication: No difficulties  Cognition Arousal/Alertness: Awake/alert Behavior During Therapy: WFL for tasks assessed/performed Overall Cognitive Status: No family/caregiver present to determine baseline cognitive functioning                                 General Comments: Overall cognition WFL.  Pt had appointment today and was aware and had already cancelled.  Did required increased time for 3 animals with "C". Followed 1 step commands well and 3 step with minor errors.      General Comments      Exercises     Assessment/Plan    PT Assessment Patient needs continued PT services  PT Problem List Decreased mobility;Decreased safety awareness;Decreased balance;Decreased knowledge of use of DME;Impaired sensation       PT Treatment Interventions DME instruction;Therapeutic activities;Gait training;Therapeutic exercise;Patient/family education;Stair training;Balance training;Functional mobility training;Neuromuscular re-education    PT Goals (Current goals can be found in the Care Plan section)  Acute Rehab PT Goals Patient Stated Goal: return home PT Goal Formulation: With patient Time For Goal Achievement: 02/11/21 Potential to Achieve Goals:  Good Additional Goals Additional Goal #1: Pt will score > 19/24 on DGI to indicate lower fall risk    Frequency Min 4X/week   Barriers to discharge Decreased caregiver support      Co-evaluation               AM-PAC PT "6 Clicks" Mobility  Outcome Measure Help needed turning from your back to your side while in a flat bed without using bedrails?: None Help needed moving from lying on your back to sitting on the side of a flat bed without using bedrails?: None Help needed moving to and from a bed to a chair (including a wheelchair)?: A Little Help needed standing up from a chair using your arms (e.g., wheelchair or bedside chair)?: A Little Help needed to walk in hospital room?: A Little Help needed climbing 3-5 steps with a railing? : A Little 6 Click Score: 20    End of Session Equipment Utilized During Treatment: Gait belt Activity Tolerance: Patient tolerated treatment well Patient left: with call bell/phone within reach;with chair alarm set;in chair Nurse Communication: Mobility status PT Visit Diagnosis: Unsteadiness on feet (R26.81);Hemiplegia and hemiparesis Hemiplegia - Right/Left: Left Hemiplegia - dominant/non-dominant: Non-dominant Hemiplegia - caused by: Cerebral infarction    Time: 7342-8768 PT Time Calculation (min) (ACUTE ONLY): 35 min   Charges:   PT Evaluation $PT Eval Moderate Complexity:  1 Mod PT Treatments $Gait Training: 8-22 mins        Anise Salvo, PT Acute Rehab Services Pager [phone] [facility] [phone]    Rayetta Humphrey 01/28/2021, 2:11 PM

## 2021-01-28 NOTE — Evaluation (Signed)
Speech Language Pathology Evaluation Patient Details Name: Isaac Stokes MRN: [id number] DOB: 12/28/1950 Today's Date: 01/28/2021 Time: 0315-9458 SLP Time Calculation (min) (ACUTE ONLY): 23.98 min  Problem List:  Patient Active Problem List   Diagnosis Date Noted  . Renal insufficiency 01/27/2021  . Polyuria 01/27/2021  . Chest pain 01/27/2021  . Tobacco use 01/27/2021  . Acute CVA (cerebrovascular accident) (HCC) 01/26/2021   Past Medical History:  Past Medical History:  Diagnosis Date  . TIA (transient ischemic attack)    Past Surgical History: History reviewed. No pertinent surgical history. HPI:  Pt is a 70 y.o. male who presented to the ED with c/o numbness and tingling on the left side of his body, gait instability. Brain MRI/MRA head showed a small acute infarct of the lateral right thalamus.  No hemorrhage or mass-effect. 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 PMH: CVA, tobacco use.   Assessment / Plan / Recommendation Clinical Impression  Pt participated in speech/language/cognition evaluation. 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 Pt expressed that he has a high-school education and received special education services through high school which "did nothing". Pt reported baseline difficulty recalling names and indicated that he believes his memory may be "a bit worse now". Speech and language skills were WFL. The [facility] Mental Status Examination was completed to evaluate the pt's cognitive-linguistic skills. He achieved a score of 19/30 which is below the normal limits of 27 or more out of 30. He exhibited difficulty with memory and required additional processing time for executive function tasks. Skilled SLP services are clinically indicated at this time to improve motor speech and cognitive-linguistic function.    SLP Assessment  SLP Recommendation/Assessment:  Patient needs continued Speech Lanaguage Pathology Services SLP Visit Diagnosis: Cognitive communication deficit (R41.841)    Follow Up Recommendations  Home health SLP    Frequency and Duration min 2x/week  2 weeks      SLP Evaluation Cognition  Overall Cognitive Status: Impaired/Different from baseline Arousal/Alertness: Awake/alert Orientation Level: Oriented X4 Attention: Focused;Sustained Focused Attention: Appears intact Sustained Attention: Appears intact Memory: Impaired Memory Impairment: Retrieval deficit;Decreased recall of new information (Immediate: 4/5; delayed: 0/5 with cues: 2/5) Awareness: Appears intact Problem Solving: Appears intact Executive Function: Sequencing;Organizing;Reasoning Reasoning:  (Additional processing time needed) Sequencing: Appears intact (Clock drawing: 4/4; Additional processing time needed) Organizing: Appears intact (Backward digit span: 3/3)       Comprehension  Auditory Comprehension Overall Auditory Comprehension: Appears within functional limits for tasks assessed Yes/No Questions: Within Functional Limits Commands: Within Functional Limits Conversation: Complex    Expression Verbal Expression Overall Verbal Expression: Appears within functional limits for tasks assessed Initiation: No impairment Level of Generative/Spontaneous Verbalization: Conversation Repetition: No impairment Naming: No impairment Pragmatics: No impairment Written Expression Dominant Hand: Right   Oral / Motor  Oral Motor/Sensory Function Overall Oral Motor/Sensory Function: Within functional limits Motor Speech Overall Motor Speech: Appears within functional limits for tasks assessed Respiration: Within functional limits Phonation: Normal Resonance: Within functional limits Intelligibility: Intelligible Motor Planning: Witnin functional limits Motor Speech Errors: Not applicable   Wiley Flicker I. Vear Clock, MS, CCC-SLP Acute Rehabilitation  Services Office number [phone] Pager [phone]                    Scheryl Marten 01/28/2021, 3:48 PM

## 2021-01-28 NOTE — Evaluation (Signed)
Occupational Therapy Evaluation Patient Details Name: Isaac Stokes MRN: [id number] DOB: 02-17-1951 Today's Date: 01/28/2021    History of Present Illness Pt is 70 yo male who presented with c/o numbness on L side of body.  Admitted with acute CVA of lateral R thalamus, L PCA occlusion, and L occipital infarct on 01/26/10.  Pt with PMH including CVA   Clinical Impression   Patient admitted for the diagnosis above.  PTA he lived alone in a senior high rise.  His brother lives a few miles away and can provide PRN help, but the patient prefers not to call him if its not needed. Barriers are listed below.  Currently he needs general supervision for basic mobility in the room due to balance and coordination deficits, and no assist with ADL from a sit/stand level.  OT will follow him in the acute setting, but home health services is recommended to regain independence at home.      Follow Up Recommendations  Home health OT    Equipment Recommendations  None recommended by OT    Recommendations for Other Services       Precautions / Restrictions Precautions Precautions: Fall Restrictions Weight Bearing Restrictions: No      Mobility Bed Mobility Overal bed mobility: Modified Independent Bed Mobility: Supine to Sit     Supine to sit: Supervision       Patient Response: Cooperative  Transfers Overall transfer level: Needs assistance Equipment used: None Transfers: Sit to/from [employer] Sit to Stand: Supervision Stand pivot transfers: Supervision       General transfer comment: mild balance deficits noted    Balance Overall balance assessment: Needs assistance Sitting-balance support: Feet supported Sitting balance-Leahy Scale: Normal     Standing balance support: No upper extremity supported Standing balance-Leahy Scale: Good Standing balance comment: Some mild deficits with gait and turns               High Level Balance Comments: mild  unsteadiness with 360 degree turns in bathroom Standardized Balance Assessment Standardized Balance Assessment : Dynamic Gait Index   Dynamic Gait Index Level Surface: Mild Impairment Change in Gait Speed: Normal Gait with Horizontal Head Turns: Mild Impairment Gait with Vertical Head Turns: Mild Impairment Gait and Pivot Turn: Mild Impairment Step Over Obstacle: Mild Impairment Step Around Obstacles: Mild Impairment Steps: Mild Impairment Total Score: 17     ADL either performed or assessed with clinical judgement   ADL Overall ADL's : Modified independent                                       General ADL Comments: Decreased coordination and balance place him at an increased fall risk, brother can help PRN, but he is able to complete his own self care and in room mobility without assist.     Vision Patient Visual Report: No change from baseline       Perception     Praxis      Pertinent Vitals/Pain Pain Assessment: No/denies pain     Hand Dominance Right   Extremity/Trunk Assessment Upper Extremity Assessment Upper Extremity Assessment: Overall WFL for tasks assessed;LUE deficits/detail LUE Sensation: decreased light touch LUE Coordination: decreased fine motor   Lower Extremity Assessment Lower Extremity Assessment: Defer to PT evaluation RLE Deficits / Details: ROM WFL; MMT 5/5 RLE Sensation: WNL RLE Coordination: WNL LLE Deficits / Details: ROM WFL; MMT  5/5 LLE Sensation: decreased light touch (pt was able to identify light touch but reports feels less than R and has difficulty feeling L foot with ambulation)   Cervical / Trunk Assessment Cervical / Trunk Assessment: Other exceptions Cervical / Trunk Exceptions: L face numb   Communication Communication Communication: No difficulties;HOH   Cognition Arousal/Alertness: Awake/alert Behavior During Therapy: WFL for tasks assessed/performed Overall Cognitive Status: Within Functional  Limits for tasks assessed                                 General Comments: Patient had no errors on SBT, does present with preseveration behaviors, and mild expressive difficulties.   General Comments                  Home Living Family/patient expects to be discharged to:: Private residence Living Arrangements: Alone Available Help at Discharge: Other (Comment) Type of Home: Apartment Home Access: Elevator     Home Layout: One level     Bathroom Shower/Tub: Chief Strategy Officer: Standard     Home Equipment: Shower seat;Grab bars - tub/shower          Prior Functioning/Environment Level of Independence: Independent        Comments: Pt independent with ADLs, IADLs, driving, grocery shopping, and community ambulation.        OT Problem List: Impaired balance (sitting and/or standing)      OT Treatment/Interventions:      OT Goals(Current goals can be found in the care plan section) Acute Rehab OT Goals Patient Stated Goal: I'd like to get back to my apartment OT Goal Formulation: With patient Time For Goal Achievement: 02/11/21 Potential to Achieve Goals: Good ADL Goals Pt Will Perform Lower Body Bathing: Independently Pt Will Perform Lower Body Dressing: Independently Pt Will Transfer to Toilet: Independently;ambulating;regular height toilet  OT Frequency:     Barriers to D/C:  none noted          Co-evaluation              AM-PAC OT "6 Clicks" Daily Activity     Outcome Measure Help from another person eating meals?: None Help from another person taking care of personal grooming?: None Help from another person toileting, which includes using toliet, bedpan, or urinal?: None Help from another person bathing (including washing, rinsing, drying)?: None Help from another person to put on and taking off regular upper body clothing?: None Help from another person to put on and taking off regular lower body clothing?:  None 6 Click Score: 24   End of Session Nurse Communication: Mobility status  Activity Tolerance: Patient tolerated treatment well Patient left: in bed;with call bell/phone within reach  OT Visit Diagnosis: Unsteadiness on feet (R26.81)                Time: 1308-6578 OT Time Calculation (min): 21 min Charges:  OT General Charges $OT Visit: 1 Visit OT Evaluation $OT Eval Moderate Complexity: 1 Mod  01/28/2021  Isaac Stokes, OTR/L  Acute Rehabilitation Services  Office:  [phone]   Isaac Stokes 01/28/2021, 3:14 PM

## 2021-01-28 NOTE — TOC Transition Note (Addendum)
Transition of Care [facility]) - CM/SW Discharge Note   Patient Details  Name: Isaac Stokes MRN: [id number] Date of Birth: 03-26-1951  [facility] CM/SW Contact:  Pollie Friar, RN Phone Number: 01/28/2021, 4:06 PM   Clinical Narrative:    PCP: [facility] Patient with recommendations for [facility] services. CM met with the patient and he had no preference on the agency. CM has set up [facility] with [facility].  Recommendations for a cane. Pt has someone he has been working with for DME through [facility] and he will get them to deliver a cane to his home.  Pt does his own transportation, but at d/c his brother can provide transport home.    Final next level of care: Home w Home Health Services Barriers to Discharge: No Barriers Identified   Patient Goals and CMS Choice   CMS Medicare.gov Compare Post Acute Care list provided to:: Patient Choice offered to / list presented to : Patient  Discharge Placement                       Discharge Plan and Services                          HH Arranged: PT,OT,Speech Therapy HH Agency: [place] Date [place]: 01/28/21   Representative spoke with at [place]: Amy  Social Determinants of Health ([place]) Interventions     Readmission Risk Interventions No flowsheet data found.

## 2021-01-28 NOTE — Plan of Care (Signed)
Min assist with adls 

## 2021-01-28 NOTE — Discharge Summary (Signed)
Physician Discharge Summary  Isaac Stokes XTG:[id number] DOB: 18-Apr-1951 DOA: 01/26/2021  PCP: Pcp, No  Admit date: 01/26/2021 Discharge date: 01/28/2021  Time spent: 40 minutes  Recommendations for Outpatient Follow-up:  1. Follow outpatient CBC/CMP 2. Follow with neurology outpatient  3. Encourage smoking cessation 4. Aspirin/plavix x21 days, then aspirin alone.   5. Follow LDL on atorva 40 mg, may need to increase to 80 mg if needed to get LDL <70 6. Follow blood pressure outpatient  7. Follow aortic root dilatation outpatient  8. Follow prediabetes outpatient  9. Follow with cardiology outpatient   Discharge Diagnoses:  Principal Problem:   Acute CVA (cerebrovascular accident) [facility]) Active Problems:   Renal insufficiency   Polyuria   Chest pain   Tobacco use   Discharge Condition: stable  Diet recommendation: heart healthy  Filed Weights   01/26/21 1402  Weight: 76.2 kg    History of present illness:  Isaac Stokes Isaac Stokes 70 y.o.malewith medical history significant ofCVA, tobacco use presented to the ED with complaints of numbness and tingling on the left side of his body, gait instability which started at 9 AM, LKN at 2 am. No significant lab abnormalities except borderline anemia and creatinine 1.7. CT head negative for acute intracranial abnormality. Brain MRI/MRA head showing [name] small acute infarct of the lateral right thalamus. No hemorrhage or mass-effect. 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   He was seen by neurology who recommended DAPT x21 days, then aspirin alone as well as lipitor.  Plan for outpatient follow up.    See below for additional details  Hospital Course:  Acute CVA MRI brain with small acute infarct of the lateral R thalamus, L PCA occlusion at the P1-2 junction wih old L occipital infarct, chronic small vessel ischemic changes and atrophy -Neurology consulted, appreciate assistance - thought 2/2  small vessel disease (vs atheroembolic from posterior circulation atherosclerosis) - DAPT x 21 days, then aspirin alone - atorvastatin 40 mg daily - follow LDL and increase to 80 mg if needed -Telemetry monitoring - no need for event monitor on d/c - gradually normalize blood pressure - carotid US with near normal extracranial vessels, right vertebral artery with retrograde flow, left vertebral artery with high risistant flow.  Stenotic bilateral subclavian arteries. - PT/OT/SLP -> recommending home health (ordered) -Transthoracic echocardiogram with normal EF, see below -Hemoglobin A1c (5.7), fasting lipid panel (LDL 166, HDL 40) -Frequent neurochecks  Renal insufficiency Improved to 1.5 on day of discharge, follow outpatient   Elevated Blood Pressures Fluctuating here, follow outpatient, may need antihypertensives  Polyuria, polydipsia -Check A1c (5.7),UA (bland) - not c/w diabetes  Chest pain Ongoing for the past year and appears to be nonexertional. Most recent episode of chest pain was 2 weeks ago. EKG without acute ischemic changes. No chest pain at present. -Echocardiogram without WMA, normal EF (see report). Will need cardiology follow-up.  Tobacco use -encouraged cessation.  He isn't totally sold on quitting, but seemed to understand importance when discussed in relationship to stroke.  Discussed meds, NRT.  Will discharge with NRT.   Procedures: Carotid US Summary:  Right Carotid: The extracranial vessels were near-normal with only minimal  wall         thickening or plaque.   Left Carotid: The extracranial vessels were near-normal with only minimal  wall        thickening or plaque.   Vertebrals: Right vertebral artery demonstrates retrograde flow. Left  vertebral  artery demonstrates high resistant flow.  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   *See table(s) above for measurements and observations.     Echo IMPRESSIONS    1. Left ventricular ejection fraction, by estimation, is 55%. The left  ventricle has normal function. The left ventricle has no regional wall  motion abnormalities. Left ventricular diastolic parameters are consistent  with Grade I diastolic dysfunction  (impaired relaxation).  2. Right ventricular systolic function is normal. The right ventricular  size is normal. Tricuspid regurgitation signal is inadequate for assessing  PA pressure.  3. The mitral valve is normal in structure. Mild mitral valve  regurgitation. No evidence of mitral stenosis.  4. The aortic valve is tricuspid. Aortic valve regurgitation is mild. No  aortic stenosis is present.  5. Aortic dilatation noted. There is mild dilatation of the aortic root,  measuring 40 mm.  6. The inferior vena cava is normal in size with greater than 50%  respiratory variability, suggesting right atrial pressure of 3 mmHg.   Conclusion(s)/Recommendation(s): No intracardiac source of embolism  detected on this transthoracic study. Isaac Stokes transesophageal echocardiogram is  recommended to exclude cardiac source of embolism if clinically indicated.    Consultations:  neurology  Discharge Exam: Vitals:   01/28/21 1121 01/28/21 1603  BP: (!) 154/89 (!) 165/82  Pulse: 67 71  Resp: 18 18  Temp: 97.8 F (36.6 C) 97.6 F (36.4 C)  SpO2: 100% 99%   No new complaints Persistent L sided numbness When I offer to call family, he declines Discussed d/c plan  General: No acute distress. Cardiovascular: Heart sounds show Isaac Stokes regular rate, and rhythm. No gallops or rubs. No murmurs. No JVD. Lungs: Clear to auscultation bilaterally with good air movement. No rales, rhonchi or wheezes. Abdomen: Soft, nontender, nondistended with normal active bowel sounds. No masses. No hepatosplenomegaly. Neurological: Alert and oriented 3. Moves all extremities 4 with relatively symmetric strength.  Decreased sensation on L to  light touch.  Mild dysmetria on L FNF.  Skin: Warm and dry. No rashes or lesions. Extremities: No clubbing or cyanosis. No edema.   Discharge Instructions   Discharge Instructions    Ambulatory referral to Neurology   Complete by: As directed    An appointment is requested in approximately: 8 weeks   Call MD for:  difficulty breathing, headache or visual disturbances   Complete by: As directed    Call MD for:  extreme fatigue   Complete by: As directed    Call MD for:  hives   Complete by: As directed    Call MD for:  persistant dizziness or light-headedness   Complete by: As directed    Call MD for:  persistant nausea and vomiting   Complete by: As directed    Call MD for:  redness, tenderness, or signs of infection (pain, swelling, redness, odor or green/yellow discharge around incision site)   Complete by: As directed    Call MD for:  severe uncontrolled pain   Complete by: As directed    Call MD for:  temperature >100.4   Complete by: As directed    Diet - low sodium heart healthy   Complete by: As directed    Discharge instructions   Complete by: As directed    You were seen for Marcellous Snarski stroke.   You were seen by neurology who are recommending aspirin and plavix for 21 days, then aspirin alone.  We'll also start you on lipitor.  Smoking cessation is extremely important.  We'll send you with nicotine patches to help with the urge to smoke as well as nicotine gum.  We'll send you home with home health services as well.  You have prediabetes and high blood pressure which will need to be followed and managed outpatient.  You will likely need new blood pressure medicines.    You should see cardiology as an outpatient.   Return for new, recurrent, or worsening symptoms.  Please ask your PCP to request records from this hospitalization so they know what was done and what the next steps will be.   Increase activity slowly   Complete by: As directed      Allergies as of 01/28/2021    No Known Allergies     Medication List    TAKE these medications   acetaminophen 500 MG tablet Commonly known as: TYLENOL Take 1,000 mg by mouth in the morning and at bedtime.   aspirin 81 MG chewable tablet Chew 1 tablet (81 mg total) by mouth daily. Start taking on: January 29, 2021   atorvastatin 40 MG tablet Commonly known as: LIPITOR Take 1 tablet (40 mg total) by mouth at bedtime.   ciclopirox 8 % solution Commonly known as: Penlac Apply topically at bedtime. Apply over nail and surrounding skin. Apply daily over previous coat. After seven (7) days, may remove with alcohol and continue cycle.   clopidogrel 75 MG tablet Commonly known as: PLAVIX Take 1 tablet (75 mg total) by mouth daily for 21 days. (then take aspirin alone) Start taking on: January 29, 2021   nicotine 7 mg/24hr patch Commonly known as: NICODERM CQ - dosed in mg/24 hr Place 1 patch (7 mg total) onto the skin daily. Start taking on: January 29, 2021   nicotine polacrilex 2 MG gum Commonly known as: NICORETTE Take 1 each (2 mg total) by mouth as needed for smoking cessation.            Durable Medical Equipment  (From admission, onward)         Start     Ordered   01/28/21 1731  DME Cane  Once        01/28/21 1737         No Known Allergies  Follow-up Information    [facility] Follow up.   Specialty: Home Health Services Why: The home health agency will contact you for the first home visit. Contact information: [address] [place] [address] [phone]                The results of significant diagnostics from this hospitalization (including imaging, microbiology, ancillary and laboratory) are listed below for reference.    Significant Diagnostic Studies: CT HEAD WO CONTRAST  Result Date: 01/26/2021 CLINICAL DATA:  Weakness EXAM: CT HEAD WITHOUT CONTRAST TECHNIQUE: Contiguous axial images were obtained from the base of the skull through the vertex without  intravenous contrast. COMPARISON:  None. FINDINGS: Brain: No evidence of acute infarction, hemorrhage, hydrocephalus, extra-axial collection or mass lesion/mass effect. Chronic white matter ischemic change is seen. Additionally prior infarcts are noted in the anterior limb of the internal capsule on the right as well as in the left occipital lobe Vascular: No hyperdense vessel or unexpected calcification. Skull: Normal. Negative for fracture or focal lesion. Sinuses/Orbits: No acute finding. Other: None. IMPRESSION: Chronic ischemic changes are noted without acute abnormality. Electronically Signed   By: Alcide Clever M.D.   On: 01/26/2021 15:08   MR ANGIO HEAD WO CONTRAST  Result Date: 01/26/2021 CLINICAL DATA:  Left-sided tingling and numbness EXAM: MRI HEAD WITHOUT CONTRAST MRA HEAD WITHOUT CONTRAST TECHNIQUE: Multiplanar, multi-echo pulse sequences of the brain and surrounding structures were acquired without intravenous contrast. Angiographic images of the Circle of Willis were acquired using MRA technique without intravenous contrast. COMPARISON: No pertinent prior exam. COMPARISON:  No pertinent prior exam. FINDINGS: MRI HEAD FINDINGS Brain: Small acute infarct of the lateral right thalamus. No acute or chronic hemorrhage. There is multifocal hyperintense T2-weighted signal within the white matter. Generalized volume loss without Akshay Spang clear lobar predilection. Old left occipital infarct. The midline structures are normal. Vascular: Major flow voids are preserved. Skull and upper cervical spine: Normal calvarium and skull base. Visualized upper cervical spine and soft tissues are normal. Sinuses/Orbits:No paranasal sinus fluid levels or advanced mucosal thickening. No mastoid or middle ear effusion. Normal orbits. MRA HEAD FINDINGS POSTERIOR CIRCULATION: --Vertebral arteries: Normal --Inferior cerebellar arteries: Normal. --Basilar artery: Normal. --Superior cerebellar arteries: Normal. --Posterior cerebral  arteries: Left PCA occlusion at the P1-2 junction. Normal right PCA. ANTERIOR CIRCULATION: --Intracranial internal carotid arteries: Normal. --Anterior cerebral arteries (ACA): Normal. --Middle cerebral arteries (MCA): Normal. ANATOMIC VARIANTS: None IMPRESSION: 1. 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 No hemorrhage or mass effect. 2. 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 3. Chronic small vessel ischemic changes and atrophy. Electronically Signed   By: Deatra Robinson M.D.   On: 01/26/2021 21:16   MR BRAIN WO CONTRAST  Result Date: 01/26/2021 CLINICAL DATA:  Left-sided tingling and numbness EXAM: MRI HEAD WITHOUT CONTRAST MRA HEAD WITHOUT CONTRAST TECHNIQUE: Multiplanar, multi-echo pulse sequences of the brain and surrounding structures were acquired without intravenous contrast. Angiographic images of the Circle of Willis were acquired using MRA technique without intravenous contrast. COMPARISON: No pertinent prior exam. COMPARISON:  No pertinent prior exam. FINDINGS: MRI HEAD FINDINGS Brain: Small acute infarct of the lateral right thalamus. No acute or chronic hemorrhage. There is multifocal hyperintense T2-weighted signal within the white matter. Generalized volume loss without Steffani Dionisio clear lobar predilection. Old left occipital infarct. The midline structures are normal. Vascular: Major flow voids are preserved. Skull and upper cervical spine: Normal calvarium and skull base. Visualized upper cervical spine and soft tissues are normal. Sinuses/Orbits:No paranasal sinus fluid levels or advanced mucosal thickening. No mastoid or middle ear effusion. Normal orbits. MRA HEAD FINDINGS POSTERIOR CIRCULATION: --Vertebral arteries: Normal --Inferior cerebellar arteries: Normal. --Basilar artery: Normal. --Superior cerebellar arteries: Normal. 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 Normal right PCA. ANTERIOR CIRCULATION: --Intracranial  internal carotid arteries: Normal. --Anterior cerebral arteries (ACA): Normal. --Middle cerebral arteries (MCA): Normal. ANATOMIC VARIANTS: None IMPRESSION: 1. 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 No hemorrhage or mass effect. 2. 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 3. Chronic small vessel ischemic changes and atrophy. Electronically Signed   By: Deatra Robinson M.D.   On: 01/26/2021 21:16   ECHOCARDIOGRAM COMPLETE  Result Date: 01/27/2021    ECHOCARDIOGRAM REPORT   Patient Name:   DRAPER GALLON Date of Exam: 01/27/2021 Medical Rec #:  [id number]        Height:       72.0 in Accession #:    [id number]       Weight:       168.0 lb Date of Birth:  08/14/1951        BSA:          1.978  m Patient Age:    69 years         BP:           144/85 mmHg Patient Gender: M                HR:           63 bpm. Exam Location:  Inpatient Procedure: 2D Echo, Cardiac Doppler and Color Doppler Indications:    Stroke I63.9  History:        Patient has no prior history of Echocardiogram examinations.                 TIA.  Sonographer:    Elmarie Shiley Dance Referring Phys: [id number] [name] RATHORE IMPRESSIONS  1. Left ventricular ejection fraction, by estimation, is 55%. The left ventricle has normal function. The left ventricle has no regional wall motion abnormalities. Left ventricular diastolic parameters are consistent with Grade I diastolic dysfunction (impaired relaxation).  2. Right ventricular systolic function is normal. The right ventricular size is normal. Tricuspid regurgitation signal is inadequate for assessing PA pressure.  3. The mitral valve is normal in structure. Mild mitral valve regurgitation. No evidence of mitral stenosis.  4. The aortic valve is tricuspid. Aortic valve regurgitation is mild. No aortic stenosis is present.  5. Aortic dilatation noted. There is mild dilatation of the aortic root, measuring 40 mm.  6. The inferior vena cava is normal in size with greater  than 50% respiratory variability, suggesting right atrial pressure of 3 mmHg. Conclusion(s)/Recommendation(s): No intracardiac source of embolism detected on this transthoracic study. Jessel Gettinger transesophageal echocardiogram is recommended to exclude cardiac source of embolism if clinically indicated. FINDINGS  Left Ventricle: Left ventricular ejection fraction, by estimation, is 55%. The left ventricle has normal function. The left ventricle has no regional wall motion abnormalities. The left ventricular internal cavity size was normal in size. There is no left ventricular hypertrophy. Left ventricular diastolic parameters are consistent with Grade I diastolic dysfunction (impaired relaxation). Right Ventricle: The right ventricular size is normal. No increase in right ventricular wall thickness. Right ventricular systolic function is normal. Tricuspid regurgitation signal is inadequate for assessing PA pressure. Left Atrium: Left atrial size was normal in size. Right Atrium: Right atrial size was normal in size. Pericardium: There is no evidence of pericardial effusion. Mitral Valve: The mitral valve is normal in structure. Mild mitral valve regurgitation. No evidence of mitral valve stenosis. Tricuspid Valve: The tricuspid valve is normal in structure. Tricuspid valve regurgitation is trivial. No evidence of tricuspid stenosis. Aortic Valve: The aortic valve is tricuspid. Aortic valve regurgitation is mild. Aortic regurgitation PHT measures 497 msec. No aortic stenosis is present. Pulmonic Valve: The pulmonic valve was normal in structure. Pulmonic valve regurgitation is trivial. No evidence of pulmonic stenosis. Aorta: Aortic dilatation noted. There is mild dilatation of the aortic root, measuring 40 mm. Venous: The inferior vena cava is normal in size with greater than 50% respiratory variability, suggesting right atrial pressure of 3 mmHg. IAS/Shunts: No atrial level shunt detected by color flow Doppler.  LEFT  VENTRICLE PLAX 2D LVIDd:         4.10 cm  Diastology LVIDs:         2.90 cm  LV e' medial:    6.64 cm/s LV PW:         1.00 cm  LV E/e' medial:  9.2 LV IVS:        1.00 cm  LV e' lateral:   8.92  cm/s LVOT diam:     2.20 cm  LV E/e' lateral: 6.9 LV SV:         93 LV SV Index:   47 LVOT Area:     3.80 cm  RIGHT VENTRICLE             IVC RV Basal diam:  2.30 cm     IVC diam: 1.80 cm RV S prime:     12.60 cm/s TAPSE (M-mode): 1.7 cm LEFT ATRIUM             Index       RIGHT ATRIUM           Index LA diam:        2.90 cm 1.47 cm/m  RA Area:     11.60 cm LA Vol (A2C):   59.4 ml 30.02 ml/m RA Volume:   25.30 ml  12.79 ml/m LA Vol (A4C):   27.5 ml 13.90 ml/m LA Biplane Vol: 41.7 ml 21.08 ml/m  AORTIC VALVE LVOT Vmax:   122.00 cm/s LVOT Vmean:  77.600 cm/s LVOT VTI:    0.245 m AI PHT:      497 msec  AORTA Ao Root diam: 4.00 cm Ao Asc diam:  3.30 cm MITRAL VALVE MV Area (PHT): 2.42 cm    SHUNTS MV Decel Time: 313 msec    Systemic VTI:  0.24 m MV E velocity: 61.30 cm/s  Systemic Diam: 2.20 cm MV Even Budlong velocity: 81.80 cm/s MV E/Thang Flett ratio:  0.75 Weston BrassGayatri Acharya MD Electronically signed by Weston BrassGayatri Acharya MD Signature Date/Time: 01/27/2021/11:40:02 AM    Final    VAS US CAROTID  Result Date: 01/27/2021 Carotid Arterial Duplex Study Patient Name:  Efraim KaufmannBOBBY R Rowlette  Date of Exam:   01/27/2021 Medical Rec #: [id number]         Accession #:    [id number] Date of Birth: 07-20-1951         Patient Gender: M Patient Age:   69Y [facility] Procedure:      VAS US CAROTID Referring Phys: [id number] SRISHTI L BHAGAT --------------------------------------------------------------------------------  Indications:       Numbness and unsteady gait. Risk Factors:      Current smoker, prior CVA. Comparison Study:  No previous exams Performing Technologist: Jody Hill RVT, RDMS  Examination Guidelines: Jess Sulak complete evaluation includes B-mode imaging, spectral Doppler, color Doppler, and power Doppler as needed of all accessible  portions of each vessel. Bilateral testing is considered an integral part of Neriyah Cercone complete examination. Limited examinations for reoccurring indications may be performed as noted.  Right Carotid Findings: +----------+--------+--------+--------+------------------+------------------+           PSV cm/sEDV cm/sStenosisPlaque DescriptionComments           +----------+--------+--------+--------+------------------+------------------+ CCA Prox  119     13                                intimal thickening +----------+--------+--------+--------+------------------+------------------+ CCA Distal95      20                                intimal thickening +----------+--------+--------+--------+------------------+------------------+ ICA Prox  64      19                                                   +----------+--------+--------+--------+------------------+------------------+  ICA Distal66      21                                                   +----------+--------+--------+--------+------------------+------------------+ ECA       62      7                                                    +----------+--------+--------+--------+------------------+------------------+ +----------+--------+-------+--------+-------------------+           PSV cm/sEDV cmsDescribeArm Pressure (mmHG) +----------+--------+-------+--------+-------------------+ [id number]            Stenotic                    +----------+--------+-------+--------+-------------------+ +---------+--------+--+--------+-+----------+ VertebralPSV cm/s37EDV cm/s0Retrograde +---------+--------+--+--------+-+----------+  Left Carotid Findings: +----------+--------+--------+--------+------------------+------------------+           PSV cm/sEDV cm/sStenosisPlaque DescriptionComments           +----------+--------+--------+--------+------------------+------------------+ CCA Prox  59      11                                 intimal thickening +----------+--------+--------+--------+------------------+------------------+ CCA Distal87      22                                intimal thickening +----------+--------+--------+--------+------------------+------------------+ ICA Prox  63      18                                                   +----------+--------+--------+--------+------------------+------------------+ ICA Distal80      24                                                   +----------+--------+--------+--------+------------------+------------------+ ECA       74      0                                                    +----------+--------+--------+--------+------------------+------------------+ +----------+--------+--------+--------+-------------------+           PSV cm/sEDV cm/sDescribeArm Pressure (mmHG) +----------+--------+--------+--------+-------------------+ Subclavian201             Stenotic                    +----------+--------+--------+--------+-------------------+ +---------+--------+--+--------+-+--------------+ VertebralPSV cm/s67EDV cm/s0High resistant +---------+--------+--+--------+-+--------------+   Summary: Right Carotid: The extracranial vessels were near-normal with only minimal wall                thickening or plaque. Left Carotid: The extracranial vessels were near-normal with only minimal wall               thickening or plaque.  Vertebrals:  Right vertebral artery demonstrates retrograde flow. Left vertebral              artery demonstrates high resistant flow. 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 *See table(s) above for measurements and observations.  Electronically signed by Coral Else MD on 01/27/2021 at 10:17:11 PM.    Final     Microbiology: Recent Results (from the past 240 hour(s))  Resp Panel by RT-PCR (Flu Rebekah Zackery&B, Covid) Nasopharyngeal Swab     Status: None   Collection Time: 01/26/21  8:54 PM    Specimen: Nasopharyngeal Swab; Nasopharyngeal(NP) swabs in vial transport medium  Result Value Ref Range Status   SARS Coronavirus 2 by RT PCR NEGATIVE NEGATIVE Final    Comment: (NOTE) SARS-CoV-2 target nucleic acids are NOT DETECTED.  The SARS-CoV-2 RNA is generally detectable in upper respiratory specimens during the acute phase of infection. The lowest concentration of SARS-CoV-2 viral copies this assay can detect is 138 copies/mL. Raley Novicki negative result does not preclude SARS-Cov-2 infection and should not be used as the sole basis for treatment or other patient management decisions. Elchonon Maxson negative result may occur with  improper specimen collection/handling, submission of specimen other than nasopharyngeal swab, presence of viral mutation(s) within the areas targeted by this assay, and inadequate number of viral copies(<138 copies/mL). Ryla Cauthon negative result must be combined with clinical observations, patient history, and epidemiological information. The expected result is Negative.  Fact Sheet for Patients:  BloggerCourse.com  Fact Sheet for Healthcare Providers:  SeriousBroker.it  This test is no t yet approved or cleared by the [place] FDA and  has been authorized for detection and/or diagnosis of SARS-CoV-2 by FDA under an Emergency Use Authorization (EUA). This EUA will remain  in effect (meaning this test can be used) for the duration of the COVID-19 declaration under Section 564(b)(1) of the Act, 21 U.S.C.section 360bbb-3(b)(1), unless the authorization is terminated  or revoked sooner.       Influenza Farhad Burleson by PCR NEGATIVE NEGATIVE Final   Influenza B by PCR NEGATIVE NEGATIVE Final    Comment: (NOTE) The Xpert Xpress SARS-CoV-2/FLU/RSV plus assay is intended as an aid in the diagnosis of influenza from Nasopharyngeal swab specimens and should not be used as Mekenzie Modeste sole basis for treatment. Nasal washings and aspirates are  unacceptable for Xpert Xpress SARS-CoV-2/FLU/RSV testing.  Fact Sheet for Patients: BloggerCourse.com  Fact Sheet for Healthcare Providers: SeriousBroker.it  This test is not yet approved or cleared by the [place] FDA and has been authorized for detection and/or diagnosis of SARS-CoV-2 by FDA under an Emergency Use Authorization (EUA). This EUA will remain in effect (meaning this test can be used) for the duration of the COVID-19 declaration under Section 564(b)(1) of the Act, 21 U.S.C. section 360bbb-3(b)(1), unless the authorization is terminated or revoked.  Performed at [facility], [address] [place], [place] [address]      Labs: Basic Metabolic Panel: Recent Labs  Lab 01/26/21 1404 01/26/21 1505 01/27/21 0310 01/28/21 0309  NA 138 140 136 137  K 4.2 4.2 3.6 4.1  CL 107 109 103 106  CO2 22  --  24 24  GLUCOSE 105* 102* 80 96  BUN 28* 29* 25* 26*  CREATININE 1.70* 1.70* 1.51* 1.55*  CALCIUM 9.0  --  8.7* 8.7*  MG  --   --   --  2.1  PHOS  --   --   --  3.4   Liver Function Tests: Recent Labs  Lab 01/26/21 1404 01/28/21 0309  AST 20 17  ALT 15 14  ALKPHOS 45 38  BILITOT 0.5 0.7  PROT 7.2 5.9*  ALBUMIN 3.8 3.1*   No results for input(s): LIPASE, AMYLASE in the last 168 hours. No results for input(s): AMMONIA in the last 168 hours. CBC: Recent Labs  Lab 01/26/21 1404 01/26/21 1505 01/28/21 0309  WBC 6.9  --  6.6  NEUTROABS 4.5  --  3.4  HGB 12.8* 12.9* 11.0*  HCT 40.4 38.0* 33.5*  MCV 96.4  --  93.8  PLT 239  --  183   Cardiac Enzymes: No results for input(s): CKTOTAL, CKMB, CKMBINDEX, TROPONINI in the last 168 hours. BNP: BNP (last 3 results) No results for input(s): BNP in the last 8760 hours.  ProBNP (last 3 results) No results for input(s): PROBNP in the last 8760 hours.  CBG: Recent Labs  Lab 01/26/21 1722  GLUCAP 92       Signed:  Lacretia Nicks MD.   [facility] 01/28/2021, 5:40 PM

## 2021-01-28 NOTE — Plan of Care (Signed)
  Problem: Education: Goal: Knowledge of disease or condition will improve Outcome: Progressing Goal: Knowledge of secondary prevention will improve Outcome: Progressing Goal: Knowledge of patient specific risk factors addressed and post discharge goals established will improve Outcome: Progressing Goal: Individualized Educational Video(s) Outcome: Progressing   

## 2021-01-29 LAB — HEMOGLOBIN A1C
Hgb A1c MFr Bld: 5.5 % (ref 4.8–5.6)
Mean Plasma Glucose: 111 mg/dL

## 2021-02-08 ENCOUNTER — Other Ambulatory Visit: Payer: Self-pay | Admitting: *Deleted

## 2021-02-08 NOTE — Patient Outreach (Signed)
[facility] manager [facility]) Care Management  02/08/2021  Isaac Stokes 12/09/1950 [id number]   RED ON EMMI ALERT - Stroke Day # 6 Date: 6/10 Red Alert Reason: Smoked or been around smoke   Outreach attempt #1, unsuccessful, unable to leave voice message.     Plan: RN CM will send outreach letter and follow up within the next 3-4 business days.  Kemper Durie, [place], MSN [facility] Care Management  [facility] Manager [phone]

## 2021-02-11 ENCOUNTER — Encounter: Payer: Self-pay | Admitting: *Deleted

## 2021-02-11 ENCOUNTER — Other Ambulatory Visit: Payer: Self-pay | Admitting: *Deleted

## 2021-02-11 NOTE — Patient Outreach (Addendum)
[facility] Customer service manager [facility]) Care Management  02/11/2021  Isaac Stokes 1951/07/11 [id number]   RED ON EMMI ALERT - Stroke Day # 6 Date: 6/10 Red Alert Reason: Smoked or been around smoke   Outreach attempt #2, successful.  Identity verified.  This care manager introduced self and stated purpose of call.  [facility] care management services explained.    Member report that he has been doing well, state he has been having home health visits several times a week since being discharged.  Lives alone, able to perform tasks independently, including some cooking.  Discussed stroke risk related to ongoing smoking, verbalizes understanding, state he has decreased use, now only having 4 cigarettes/day.  Denies using Nicotine patch, state he will have his brother obtain from pharmacy.  Reviewed other medications, report adherence.    This care manager inquired about follow up appointments, has one scheduled with neurology on 9/1, denies need for transportation assistance.  State he also has a new PCP office where he was seen last week, unable to remember the name of the office or the provider's name.  State it is located on [address] and [employer] (later identified by this Futures trader as [facility]).  Will follow up with office within the next month.  Denies any urgent concerns, encouraged to contact this care manager with questions.   Plan: RN CM will follow up with member within the next 2 weeks.  Kemper Durie, [place], MSN [facility] Care Management  [facility] Manager [phone]

## 2021-02-24 ENCOUNTER — Other Ambulatory Visit: Payer: Self-pay | Admitting: *Deleted

## 2021-02-24 NOTE — Patient Outreach (Signed)
[facility]) Care Management  02/24/2021  Isaac Stokes Oct 14, 1950 [id number]   Call placed to member to follow up on stroke recovery, no answer, unable to leave message.  Will follow up within the next 3-4 business days.  Kemper Durie, [place], MSN [facility] Care Management  [facility] Manager [phone]

## 2021-03-02 ENCOUNTER — Other Ambulatory Visit: Payer: Self-pay | Admitting: *Deleted

## 2021-03-02 NOTE — Patient Outreach (Signed)
[facility]) Care Management  03/02/2021  ISAEL STILLE 06/28/1951 [id number]   Outreach attempt #2, successful.  Report he has still been recovering well.  Denies any further questions about medications, state he has started new cholesterol med.  Was seen again last week by PCP and neurology still scheduled for 9/1.  Encouraged to contact either this care manager or PCP office with questions.  Will close case at this time, no further needs identified.  Kemper Durie, [place], MSN [facility] Care Management  [facility] Manager [phone]

## 2021-03-09 ENCOUNTER — Other Ambulatory Visit: Payer: Self-pay | Admitting: *Deleted

## 2021-03-09 DIAGNOSIS — I639 Cerebral infarction, unspecified: Secondary | ICD-10-CM

## 2021-03-23 ENCOUNTER — Other Ambulatory Visit: Payer: Self-pay

## 2021-03-23 ENCOUNTER — Encounter: Payer: Self-pay | Admitting: Podiatry

## 2021-03-23 ENCOUNTER — Ambulatory Visit (INDEPENDENT_AMBULATORY_CARE_PROVIDER_SITE_OTHER): Payer: Medicare Other | Admitting: Podiatry

## 2021-03-23 DIAGNOSIS — M79674 Pain in right toe(s): Secondary | ICD-10-CM | POA: Diagnosis not present

## 2021-03-23 DIAGNOSIS — M79675 Pain in left toe(s): Secondary | ICD-10-CM | POA: Diagnosis not present

## 2021-03-23 DIAGNOSIS — M21619 Bunion of unspecified foot: Secondary | ICD-10-CM | POA: Diagnosis not present

## 2021-03-23 DIAGNOSIS — B351 Tinea unguium: Secondary | ICD-10-CM | POA: Diagnosis not present

## 2021-03-23 NOTE — Patient Instructions (Signed)
The the "fungi-nail" pen

## 2021-03-25 DIAGNOSIS — G459 Transient cerebral ischemic attack, unspecified: Secondary | ICD-10-CM

## 2021-03-25 HISTORY — DX: Transient cerebral ischemic attack, unspecified: G45.9

## 2021-03-26 ENCOUNTER — Emergency Department (HOSPITAL_COMMUNITY): Payer: Medicare Other

## 2021-03-26 ENCOUNTER — Observation Stay (HOSPITAL_COMMUNITY)
Admission: EM | Admit: 2021-03-26 | Discharge: 2021-03-29 | Disposition: A | Discharge status: Home / Self Care | Payer: Medicare Other | Attending: Internal Medicine | Admitting: Internal Medicine

## 2021-03-26 DIAGNOSIS — Z8673 Personal history of transient ischemic attack (TIA), and cerebral infarction without residual deficits: Secondary | ICD-10-CM | POA: Diagnosis not present

## 2021-03-26 DIAGNOSIS — Z20822 Contact with and (suspected) exposure to covid-19: Secondary | ICD-10-CM | POA: Insufficient documentation

## 2021-03-26 DIAGNOSIS — N183 Chronic kidney disease, stage 3 unspecified: Secondary | ICD-10-CM

## 2021-03-26 DIAGNOSIS — R42 Dizziness and giddiness: Secondary | ICD-10-CM

## 2021-03-26 DIAGNOSIS — I639 Cerebral infarction, unspecified: Secondary | ICD-10-CM | POA: Diagnosis not present

## 2021-03-26 DIAGNOSIS — Q2112 Patent foramen ovale: Secondary | ICD-10-CM

## 2021-03-26 DIAGNOSIS — Y900 Blood alcohol level of less than 20 mg/100 ml: Secondary | ICD-10-CM | POA: Diagnosis not present

## 2021-03-26 DIAGNOSIS — Z79899 Other long term (current) drug therapy: Secondary | ICD-10-CM | POA: Insufficient documentation

## 2021-03-26 DIAGNOSIS — Z7982 Long term (current) use of aspirin: Secondary | ICD-10-CM | POA: Diagnosis not present

## 2021-03-26 DIAGNOSIS — F17210 Nicotine dependence, cigarettes, uncomplicated: Secondary | ICD-10-CM | POA: Insufficient documentation

## 2021-03-26 DIAGNOSIS — Q211 Atrial septal defect: Secondary | ICD-10-CM

## 2021-03-26 LAB — DIFFERENTIAL
Abs Immature Granulocytes: 0.05 10*3/uL (ref 0.00–0.07)
Basophils Absolute: 0.1 10*3/uL (ref 0.0–0.1)
Basophils Relative: 1 %
Eosinophils Absolute: 0.1 10*3/uL (ref 0.0–0.5)
Eosinophils Relative: 1 %
Immature Granulocytes: 1 %
Lymphocytes Relative: 15 %
Lymphs Abs: 1.4 10*3/uL (ref 0.7–4.0)
Monocytes Absolute: 0.6 10*3/uL (ref 0.1–1.0)
Monocytes Relative: 6 %
Neutro Abs: 7.6 10*3/uL (ref 1.7–7.7)
Neutrophils Relative %: 76 %

## 2021-03-26 LAB — CBC
HCT: 37.1 % — ABNORMAL LOW (ref 39.0–52.0)
Hemoglobin: 11.9 g/dL — ABNORMAL LOW (ref 13.0–17.0)
MCH: 30.7 pg (ref 26.0–34.0)
MCHC: 32.1 g/dL (ref 30.0–36.0)
MCV: 95.9 fL (ref 80.0–100.0)
Platelets: 196 10*3/uL (ref 150–400)
RBC: 3.87 MIL/uL — ABNORMAL LOW (ref 4.22–5.81)
RDW: 12.5 % (ref 11.5–15.5)
WBC: 9.8 10*3/uL (ref 4.0–10.5)
nRBC: 0 % (ref 0.0–0.2)

## 2021-03-26 LAB — CBG MONITORING, ED: Glucose-Capillary: 109 mg/dL — ABNORMAL HIGH (ref 70–99)

## 2021-03-26 IMAGING — CT CT HEAD W/O CM
3 series · 17 of 37 positions shown, 19 images · non-contrast
Comparison: Head CT and brain MRI [DATE]

CLINICAL DATA: Stroke, follow up

Dizziness tonight.  Episode of vomiting.
EXAM:
CT HEAD WITHOUT CONTRAST
TECHNIQUE: Contiguous axial images were obtained from the base of the skull
through the vertex without intravenous contrast.

[Series 3: head without · axial · non-contrast · 0.44mm/px · z∈[-116,+9]mm · 7 of 35 slices shown, 9 images]
[im 5/35  brain]
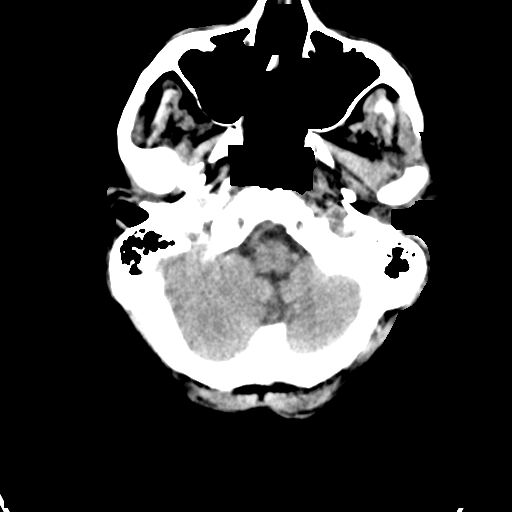
[im 5/35  bone]
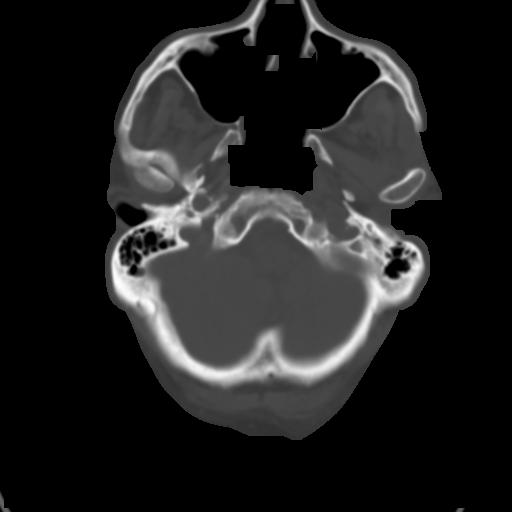
[im 9/35  brain]
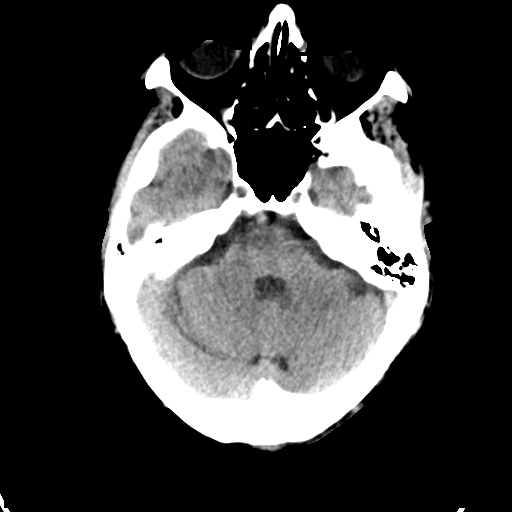
[im 13/35  brain]
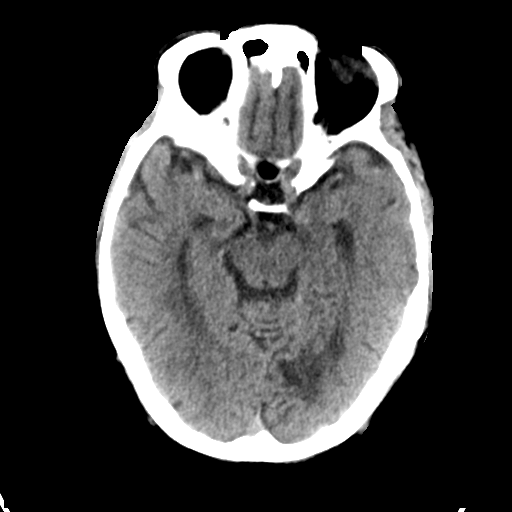
[im 18/35  brain]
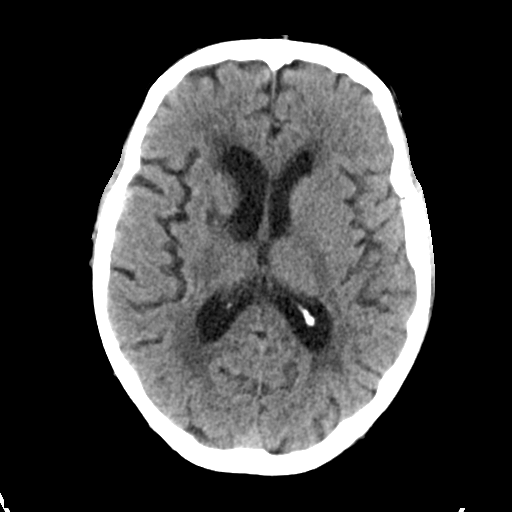
[im 22/35  brain]
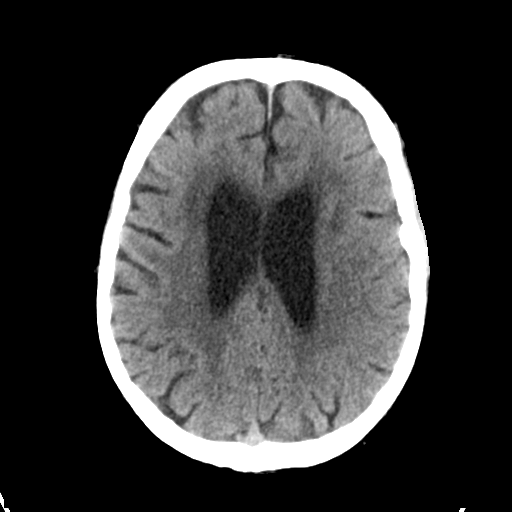
[im 22/35  bone]
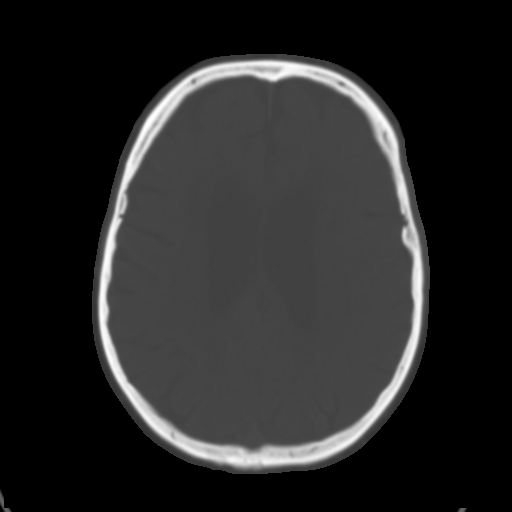
[im 26/35  brain]
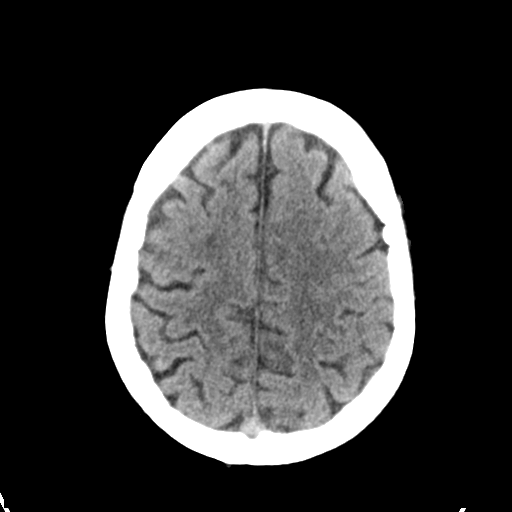
[im 30/35  brain]
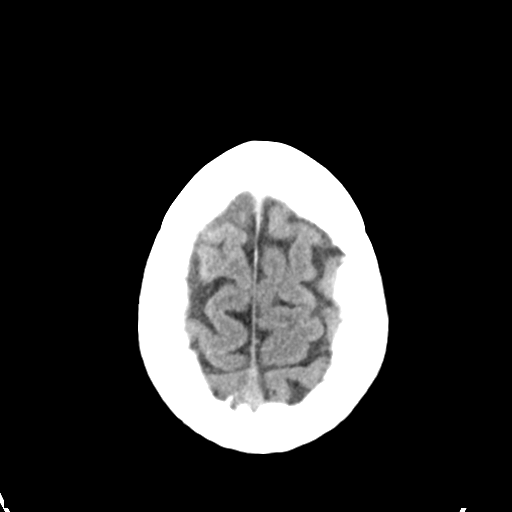

[Series 4: head bone · axial · 0.44mm/px · z∈[-120,+2]mm · 7 of 88 slices shown]
[im 9/88  bone]
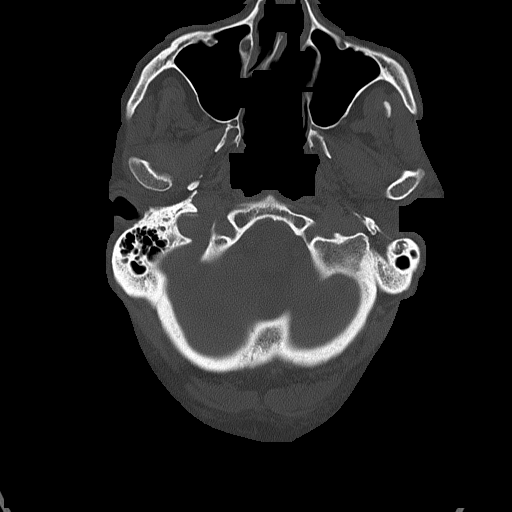
[im 18/88  bone]
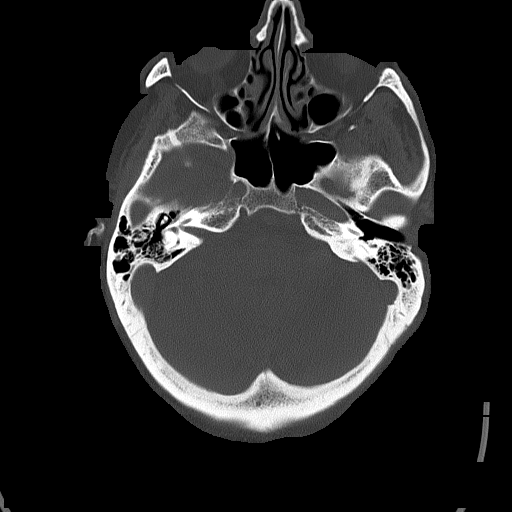
[im 27/88  bone]
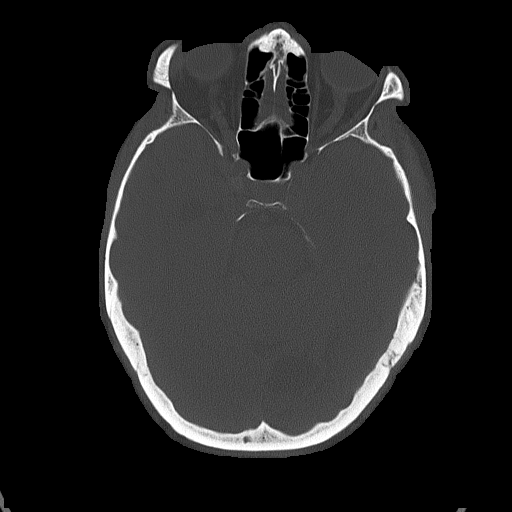
[im 40/88  bone]
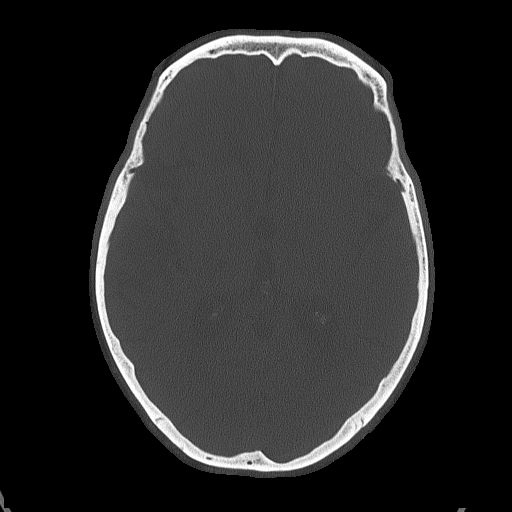
[im 48/88  bone]
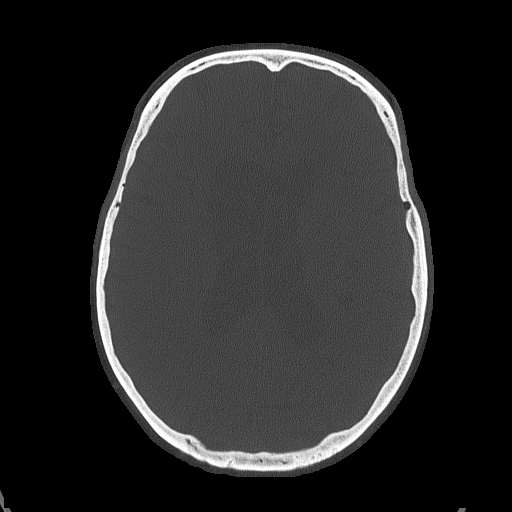
[im 61/88  bone]
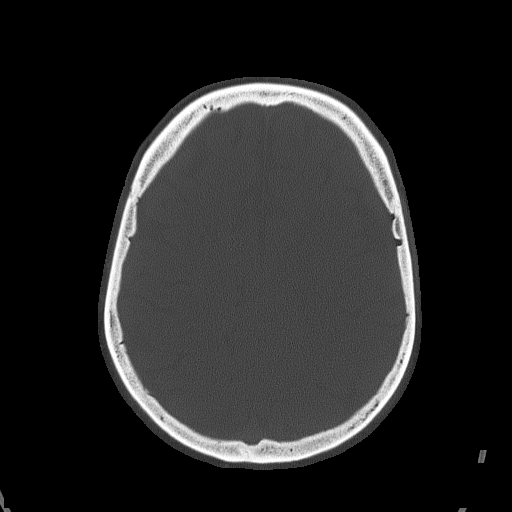
[im 70/88  bone]
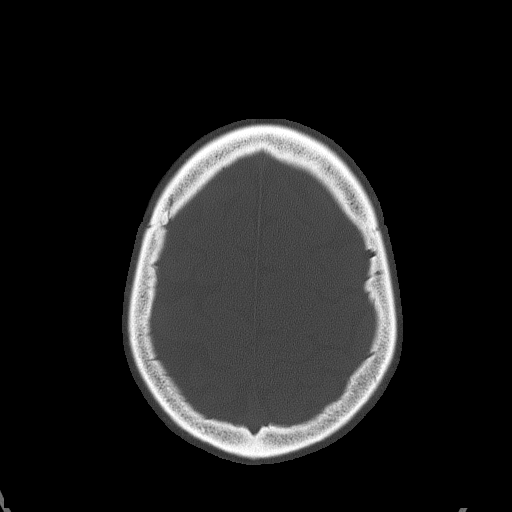

[Series 6: head without sag · sagittal · non-contrast · 0.35mm/px · 3 of 63 slices shown]
[im 21/63  brain]
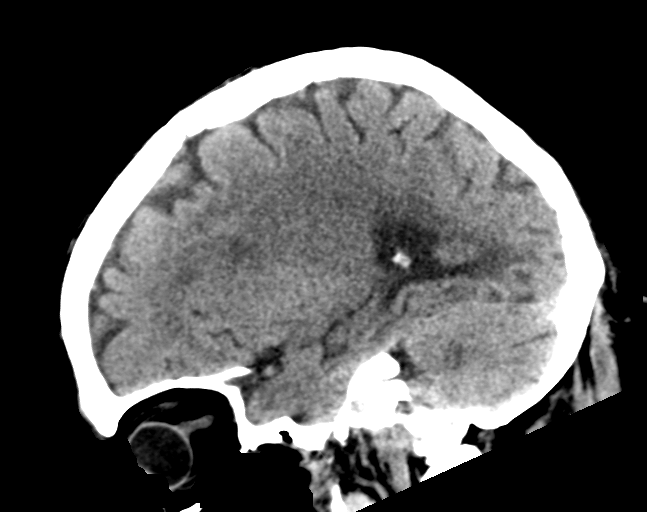
[im 32/63  brain]
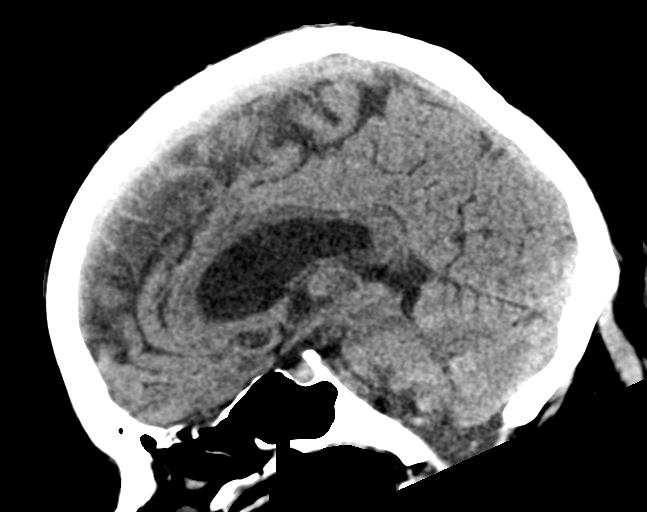
[im 42/63  brain]
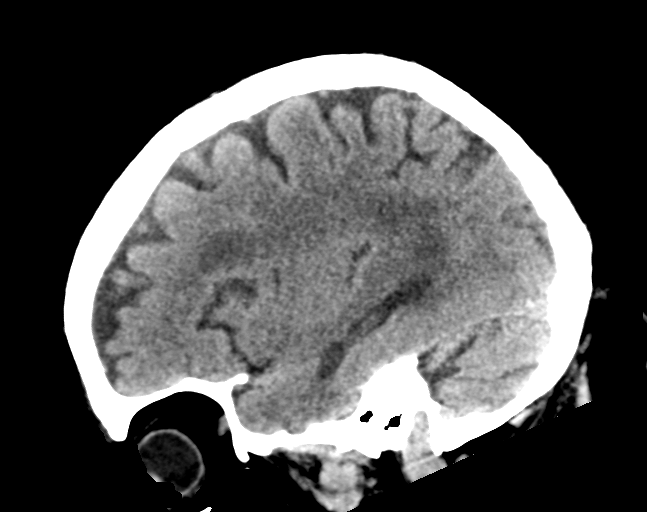

[17 of 37 positions shown; findings below may reference images not displayed]

FINDINGS: Brain: No hemorrhage or evidence of acute ischemia. Remote left
occipital infarct is unchanged. Remote right thalamic infarct is
unchanged. Small remote lacunar infarct in the posterior limb of the
right internal capsule. Chronic mild ex vacuo dilatation of the
right lateral ventricle. No hydrocephalus. Chronic periventricular
chronic small vessel ischemia. No subdural or extra-axial
collection.

Vascular: No hyperdense vessel.

Skull: No fracture or focal lesion.

Sinuses/Orbits: Paranasal sinuses and mastoid air cells are clear.
The visualized orbits are unremarkable.

Other: None.
IMPRESSION: 1. No acute intracranial abnormality.
2. Remote infarcts in the left occipital lobe, right thalamus, and
posterior limb of the right internal capsule. Chronic small vessel
ischemia.

## 2021-03-26 NOTE — ED Triage Notes (Signed)
Pt BIB [facility] from home, pt c/o sudden onset dizziness that started at 2100 tonight. Dizziness worse with movement. Pt had one episode of vomiting. Hx stroke x 2, currently taking plavix, left leg weakness after his stroke, no new weakness. Grip strengths equal, A&O x 4.

## 2021-03-27 ENCOUNTER — Observation Stay (HOSPITAL_BASED_OUTPATIENT_CLINIC_OR_DEPARTMENT_OTHER): Payer: Medicare Other

## 2021-03-27 ENCOUNTER — Other Ambulatory Visit (HOSPITAL_COMMUNITY): Payer: Medicare Other

## 2021-03-27 ENCOUNTER — Observation Stay (HOSPITAL_COMMUNITY): Payer: Medicare Other

## 2021-03-27 ENCOUNTER — Emergency Department (HOSPITAL_COMMUNITY): Payer: Medicare Other

## 2021-03-27 ENCOUNTER — Encounter (HOSPITAL_COMMUNITY): Payer: Self-pay | Admitting: Emergency Medicine

## 2021-03-27 ENCOUNTER — Other Ambulatory Visit: Payer: Self-pay

## 2021-03-27 DIAGNOSIS — N1832 Chronic kidney disease, stage 3b: Secondary | ICD-10-CM

## 2021-03-27 DIAGNOSIS — I639 Cerebral infarction, unspecified: Secondary | ICD-10-CM | POA: Diagnosis present

## 2021-03-27 DIAGNOSIS — Q211 Atrial septal defect: Secondary | ICD-10-CM

## 2021-03-27 DIAGNOSIS — I6389 Other cerebral infarction: Secondary | ICD-10-CM

## 2021-03-27 DIAGNOSIS — N183 Chronic kidney disease, stage 3 unspecified: Secondary | ICD-10-CM

## 2021-03-27 LAB — COMPREHENSIVE METABOLIC PANEL
ALT: 10 U/L (ref 0–44)
AST: 18 U/L (ref 15–41)
Albumin: 3.5 g/dL (ref 3.5–5.0)
Alkaline Phosphatase: 44 U/L (ref 38–126)
Anion gap: 8 (ref 5–15)
BUN: 27 mg/dL — ABNORMAL HIGH (ref 8–23)
CO2: 23 mmol/L (ref 22–32)
Calcium: 8.8 mg/dL — ABNORMAL LOW (ref 8.9–10.3)
Chloride: 107 mmol/L (ref 98–111)
Creatinine, Ser: 1.64 mg/dL — ABNORMAL HIGH (ref 0.61–1.24)
GFR, Estimated: 44 mL/min — ABNORMAL LOW (ref >60)
Glucose, Bld: 115 mg/dL — ABNORMAL HIGH (ref 70–99)
Potassium: 4.4 mmol/L (ref 3.5–5.1)
Sodium: 138 mmol/L (ref 135–145)
Total Bilirubin: 0.7 mg/dL (ref 0.3–1.2)
Total Protein: 6.2 g/dL — ABNORMAL LOW (ref 6.5–8.1)

## 2021-03-27 LAB — ETHANOL: Alcohol, Ethyl (B): <10 mg/dL (ref <10)

## 2021-03-27 LAB — I-STAT CHEM 8, ED
BUN: 29 mg/dL — ABNORMAL HIGH (ref 8–23)
Calcium, Ion: 1.1 mmol/L — ABNORMAL LOW (ref 1.15–1.40)
Chloride: 108 mmol/L (ref 98–111)
Creatinine, Ser: 1.7 mg/dL — ABNORMAL HIGH (ref 0.61–1.24)
Glucose, Bld: 112 mg/dL — ABNORMAL HIGH (ref 70–99)
HCT: 35 % — ABNORMAL LOW (ref 39.0–52.0)
Hemoglobin: 11.9 g/dL — ABNORMAL LOW (ref 13.0–17.0)
Potassium: 4.3 mmol/L (ref 3.5–5.1)
Sodium: 139 mmol/L (ref 135–145)
TCO2: 25 mmol/L (ref 22–32)

## 2021-03-27 LAB — URINALYSIS, ROUTINE W REFLEX MICROSCOPIC
Bilirubin Urine: NEGATIVE
Glucose, UA: NEGATIVE mg/dL
Hgb urine dipstick: NEGATIVE
Ketones, ur: NEGATIVE mg/dL
Leukocytes,Ua: NEGATIVE
Nitrite: NEGATIVE
Protein, ur: NEGATIVE mg/dL
Specific Gravity, Urine: 1.014 (ref 1.005–1.030)
pH: 7 (ref 5.0–8.0)

## 2021-03-27 LAB — APTT: aPTT: 26 seconds (ref 24–36)

## 2021-03-27 LAB — RAPID URINE DRUG SCREEN, HOSP PERFORMED
Amphetamines: NOT DETECTED
Barbiturates: NOT DETECTED
Benzodiazepines: NOT DETECTED
Cocaine: NOT DETECTED
Opiates: NOT DETECTED
Tetrahydrocannabinol: NOT DETECTED

## 2021-03-27 LAB — RESP PANEL BY RT-PCR (FLU A&B, COVID) ARPGX2
Influenza A by PCR: NEGATIVE
Influenza B by PCR: NEGATIVE
SARS Coronavirus 2 by RT PCR: NEGATIVE

## 2021-03-27 LAB — ECHOCARDIOGRAM LIMITED BUBBLE STUDY
Area-P 1/2: 2.48 cm2
Height: 72 in
P 1/2 time: 684 msec
S' Lateral: 2.7 cm
Weight: 2687.85 oz

## 2021-03-27 LAB — PROTIME-INR
INR: 1.2 (ref 0.8–1.2)
Prothrombin Time: 14.9 seconds (ref 11.4–15.2)

## 2021-03-27 IMAGING — MR MR HEAD W/O CM
6 of 11 series · 24 of 48 positions shown · non-contrast
Comparison: CT from [DATE] and MRI from [DATE].

CLINICAL DATA: Initial evaluation for neuro deficit, stroke
suspected.

EXAM:
MRI HEAD WITHOUT CONTRAST
TECHNIQUE: Multiplanar, multiecho pulse sequences of the brain and surrounding
structures were obtained without intravenous contrast.

[Series 3: DWI · axial · 3.0mm · 0.94mm/px · z∈[-29,+142]mm · 7 of 116 slices shown (1 of 2)]
[im 1/116]
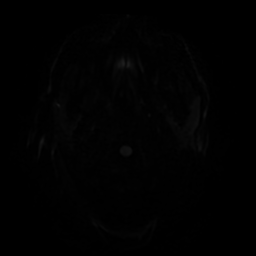
[im 20/116]
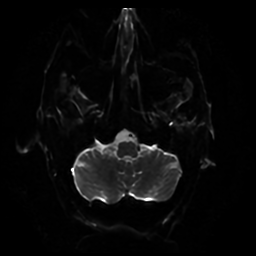
[im 39/116]
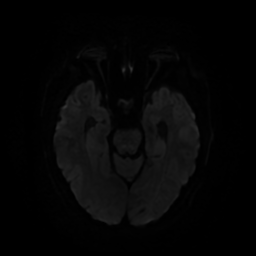
[im 58/116]
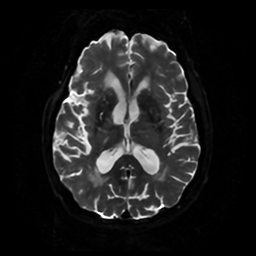
[im 77/116]
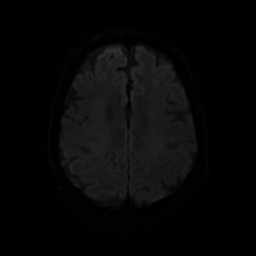
[im 96/116]
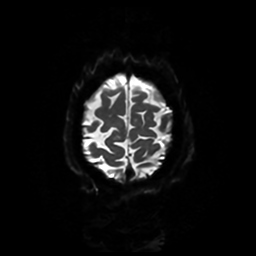
[im 116/116]
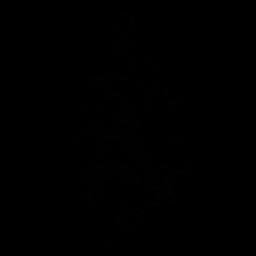

[Series 4: DWI · coronal · 4.0mm · 0.94mm/px · 5 of 78 slices shown (2 of 2)]
[im 1/78]
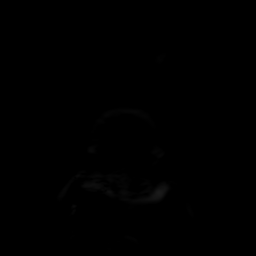
[im 20/78]
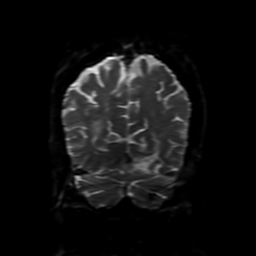
[im 39/78]
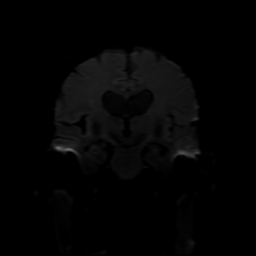
[im 58/78]
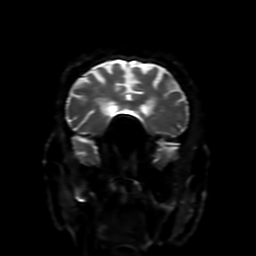
[im 78/78]
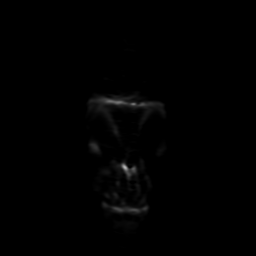

[Series 5: FLAIR · sagittal · 5.0mm · 0.23mm/px · 2 of 29 slices shown (1 of 2)]
[im 1/29]
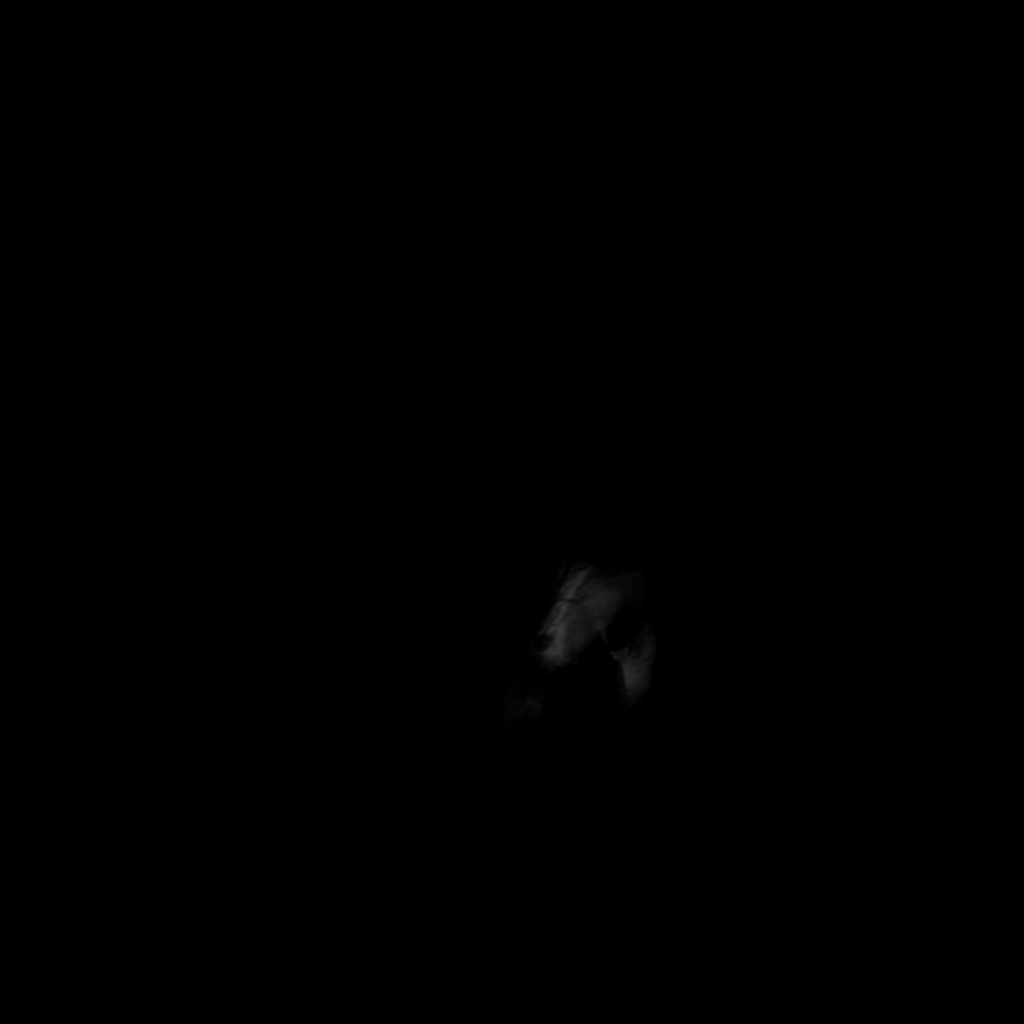
[im 29/29]
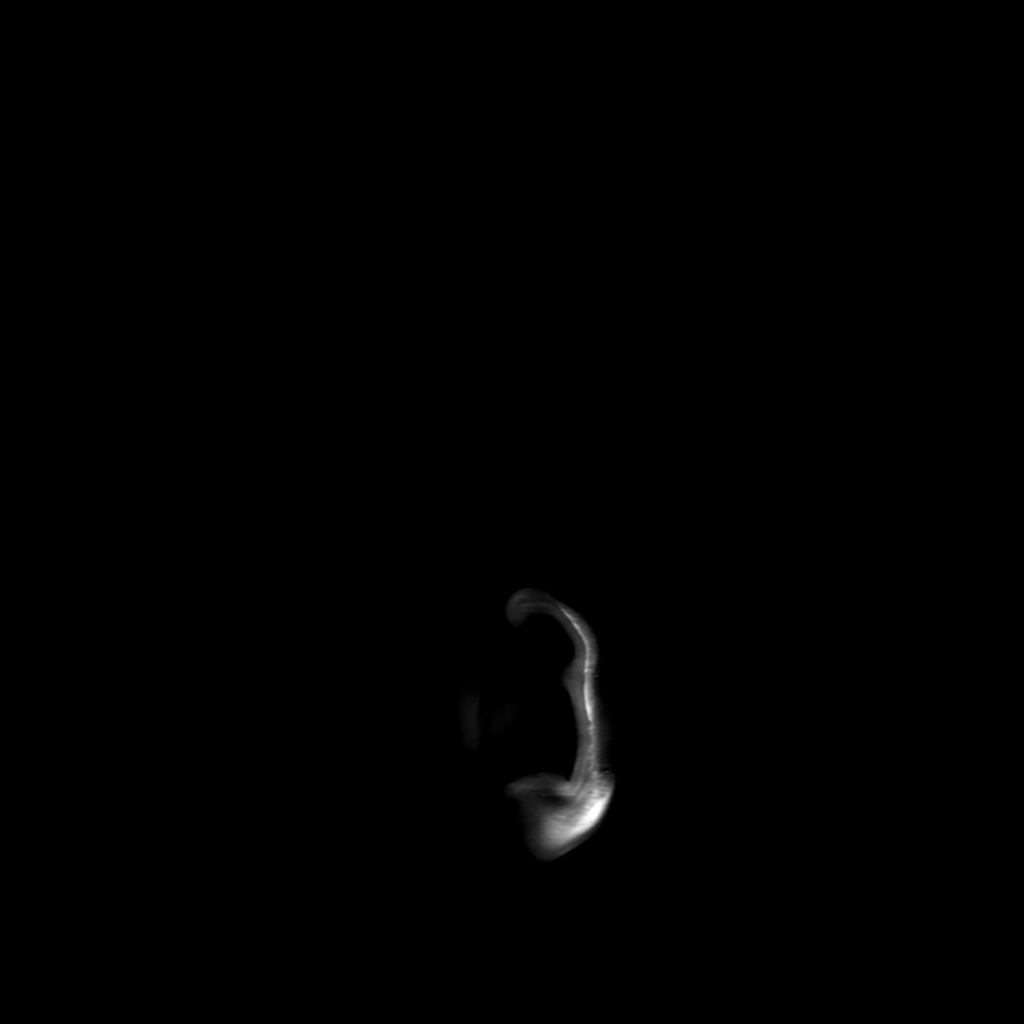

[Series 7: FLAIR · axial · 4.0mm · 0.45mm/px · z∈[-34,+141]mm · 3 of 41 slices shown (2 of 2)]
[im 1/41]
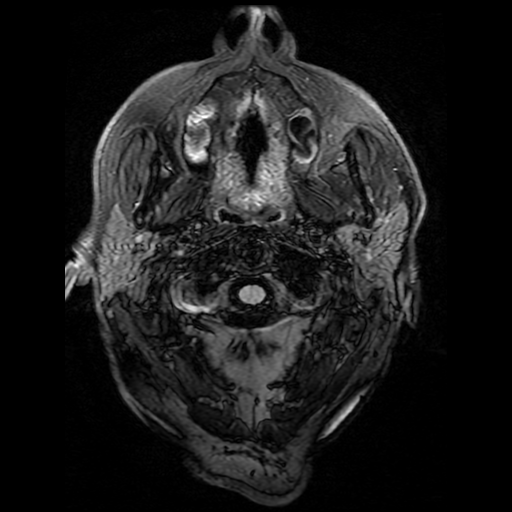
[im 21/41]
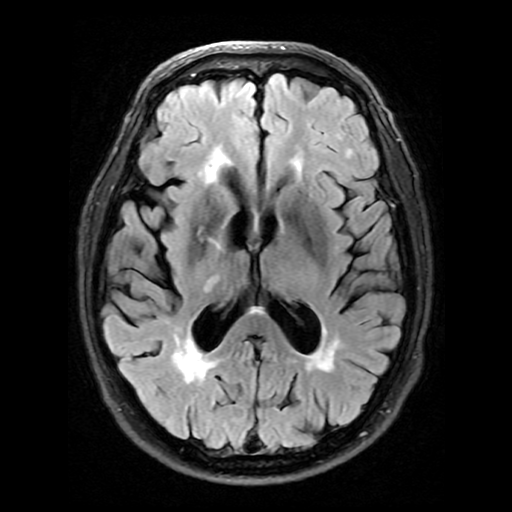
[im 41/41]
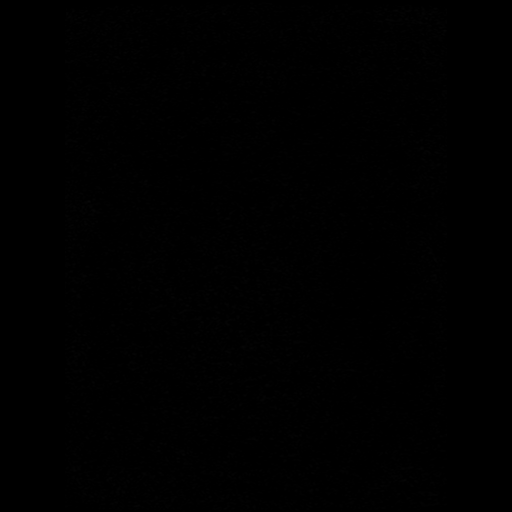

[Series 350: ADC · axial · 3.0mm · 0.94mm/px · z∈[-29,+142]mm · 4 of 58 slices shown (1 of 2)]
[im 1/58]
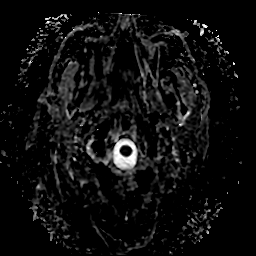
[im 20/58]
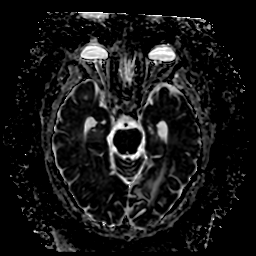
[im 39/58]
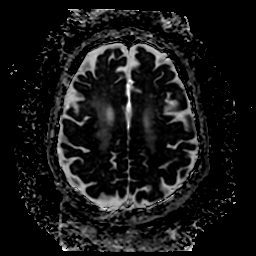
[im 58/58]
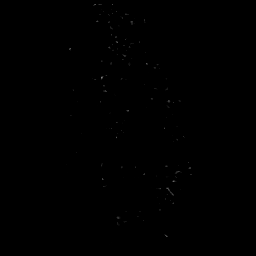

[Series 450: ADC · coronal · 4.0mm · 0.94mm/px · 3 of 38 slices shown (2 of 2)]
[im 1/38]
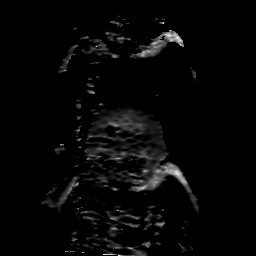
[im 19/38]
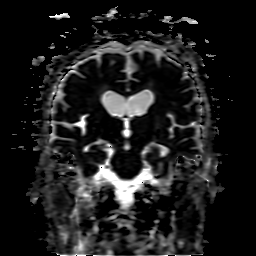
[im 38/38]
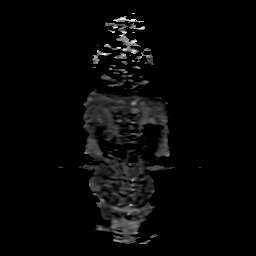

[24 of 48 positions shown; findings below may reference images not displayed]

FINDINGS: Brain: Generalized age-related cerebral atrophy with moderate
chronic microvascular ischemic disease. Interval evolution of
previously identified right thalamic infarct, now chronic in
appearance. Additional chronic right basal ganglia infarct with
associated chronic hemosiderin staining. Wallerian degeneration at
the right midbrain. Encephalomalacia and gliosis involving the left
occipital lobe consistent with a chronic left PCA distribution
infarct. Few tiny remote lacunar infarcts noted about the left
thalamus as well.

Punctate 3 mm focus of restricted diffusion seen involving the mid
right temporal lobe, consistent with a tiny acute ischemic infarct
(series 3, image 22). No associated hemorrhage or mass effect.
Additional possible tiny acute ischemic infarct at the posterior
right frontal parietal region as well (series 3, image 35). No
associated hemorrhage. No other diffusion abnormality to suggest
acute or subacute ischemia. No acute intracranial hemorrhage. Few
scattered chronic micro hemorrhages noted clustered about the
anterior right temporal pole, stable.

No mass lesion, midline shift or mass effect. Mild ex vacuo
dilatation of the right lateral ventricle without hydrocephalus. No
extra-axial fluid collection. Pituitary gland and suprasellar region
within normal limits. Subcentimeter benign appearing pineal cyst
noted of doubtful significance.

Vascular: Major intracranial vascular flow voids are maintained.

Skull and upper cervical spine: Craniocervical junction within
normal limits. Bone marrow signal intensity normal. No scalp soft
tissue abnormality.

Sinuses/Orbits: Globes and orbital soft tissues demonstrate no acute
finding. Paranasal sinuses are largely clear. Trace bilateral
mastoid effusions noted, of doubtful significance. Visualized
nasopharynx unremarkable. Inner ear structures grossly within normal
limits.

Other: None.
IMPRESSION: 1. 3 mm acute ischemic nonhemorrhagic right temporal lobe infarct.
2. Additional possible tiny acute ischemic nonhemorrhagic posterior
right frontoparietal infarct as above.
3. Underlying age-related cerebral atrophy with moderate chronic
microvascular ischemic disease, with additional chronic ischemic
infarcts as above.

## 2021-03-27 IMAGING — MR MR MRA HEAD W/O CM
1 series · 19 of 48 positions shown · non-contrast
Comparison: Comparison made with prior brain MRI from earlier the
same day as well as previous MRA from [DATE].

CLINICAL DATA: Follow-up examination for acute stroke.

EXAM:
MRA HEAD WITHOUT CONTRAST
TECHNIQUE: Angiographic images of the Circle of Willis were acquired using MRA
technique without intravenous contrast.

[Series 5: 3d cow · axial · 0.5mm · 0.41mm/px · z∈[-57,+21]mm · 19 of 172 slices shown]
[im 1/172]
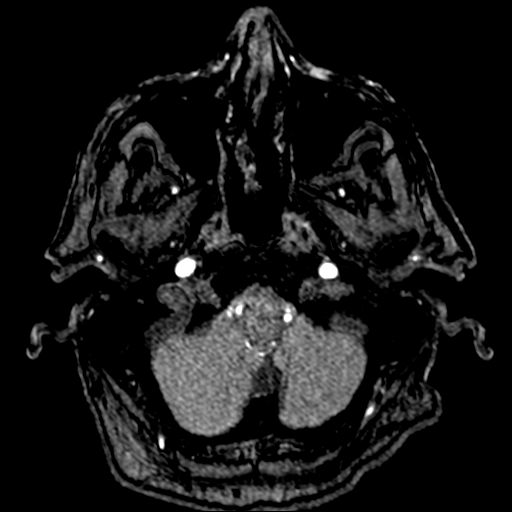
[im 4/172]
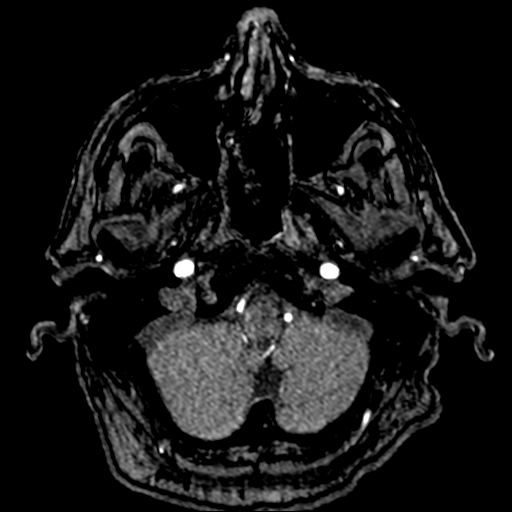
[im 8/172]
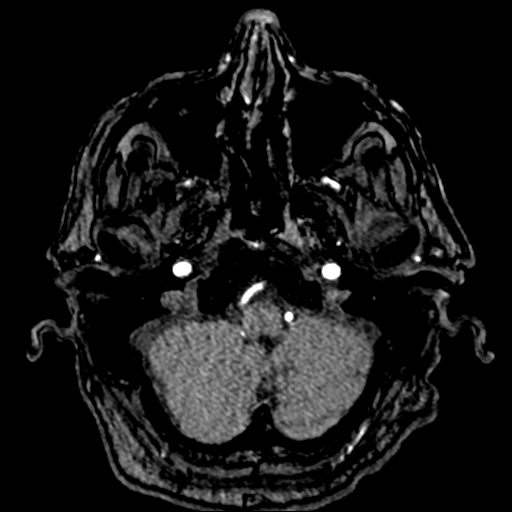
[im 11/172]
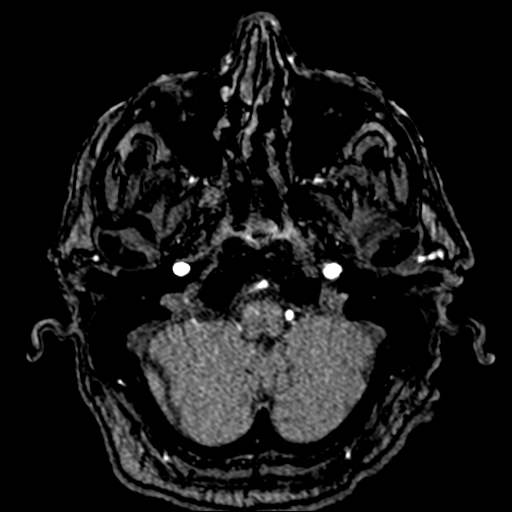
[im 15/172]
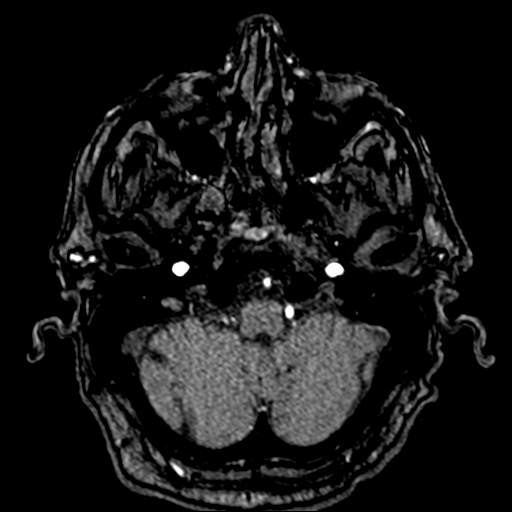
[im 19/172]
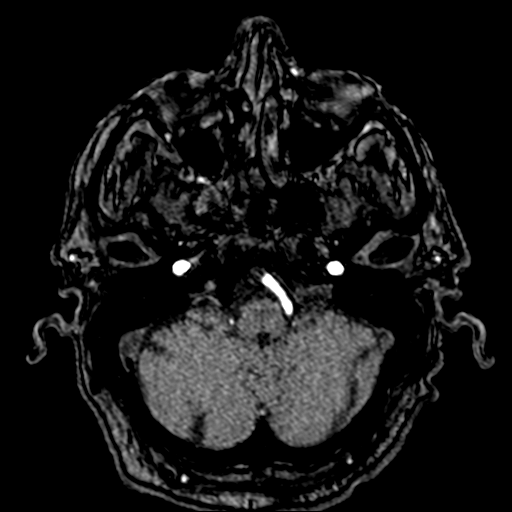
[im 22/172]
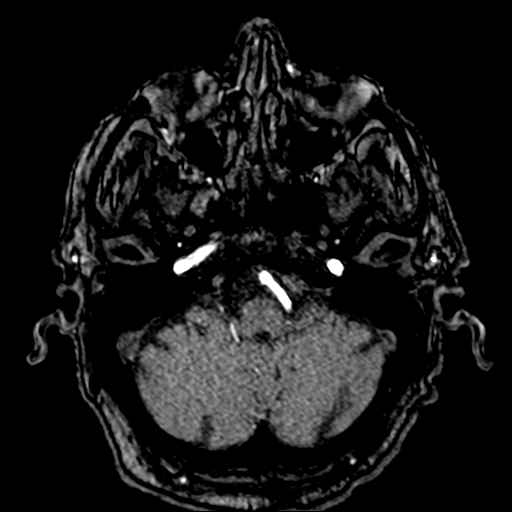
[im 26/172]
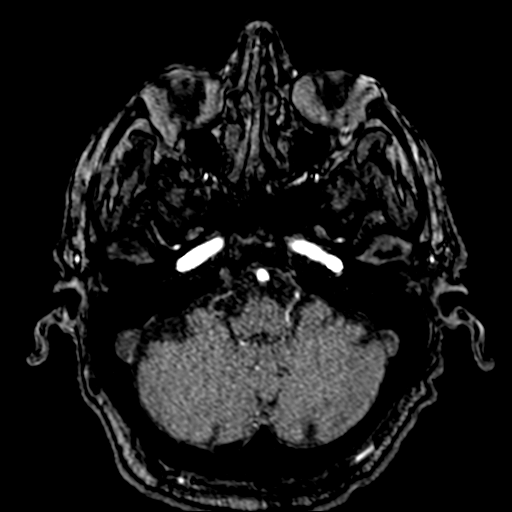
[im 30/172]
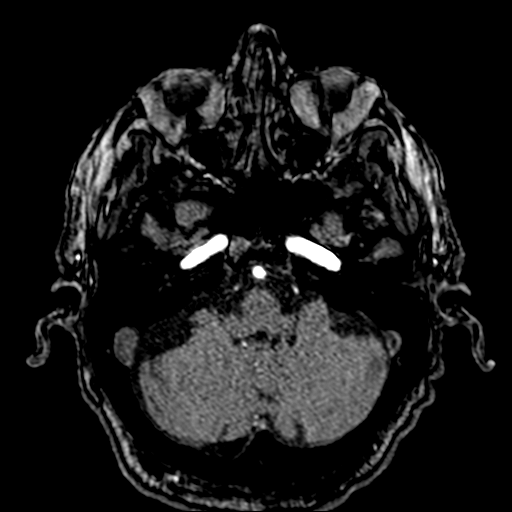
[im 33/172]
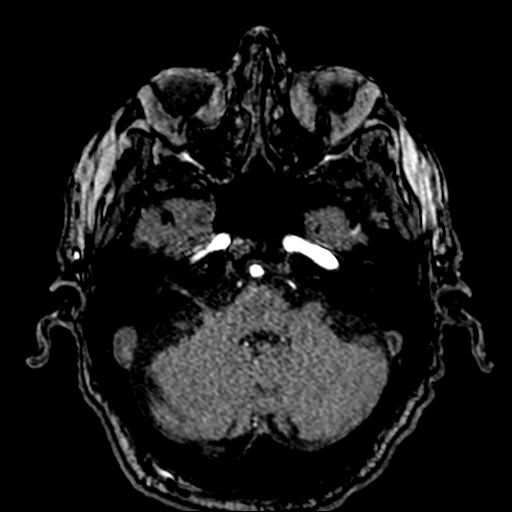
[im 37/172]
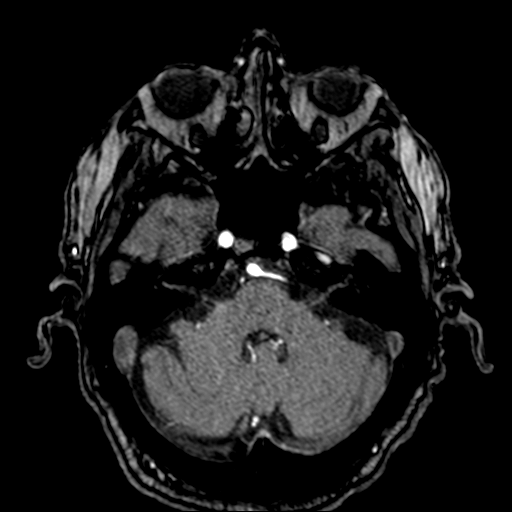
[im 55/172]
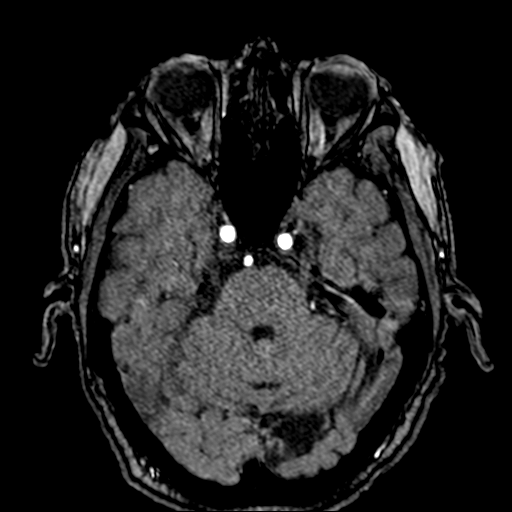
[im 77/172]
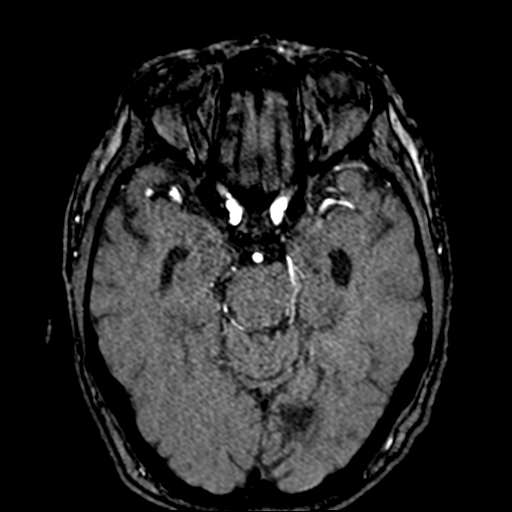
[im 88/172]
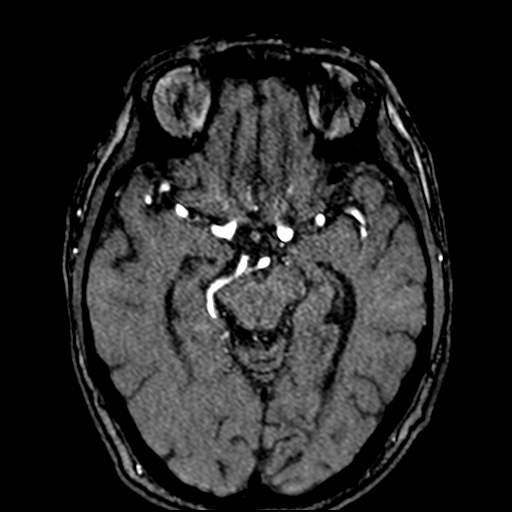
[im 99/172]
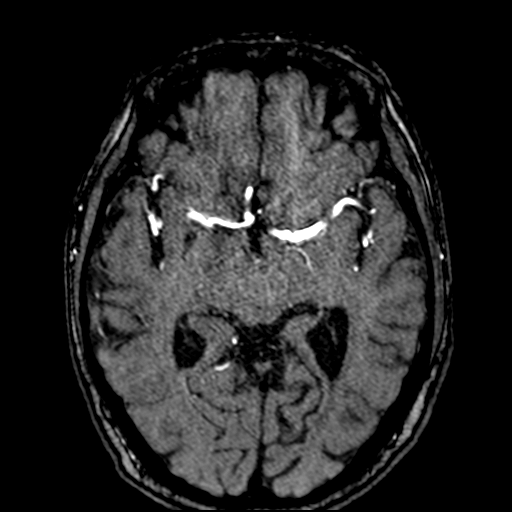
[im 121/172]
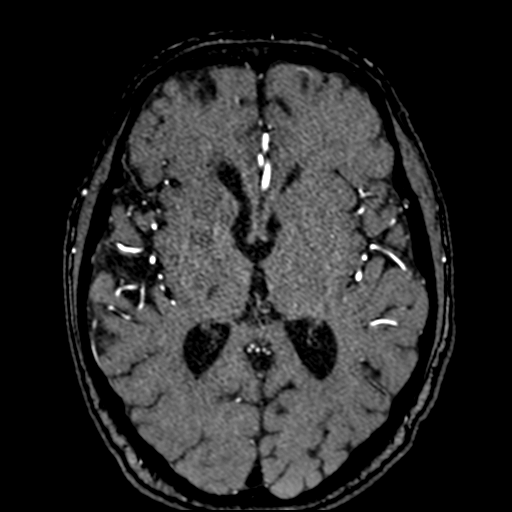
[im 142/172]
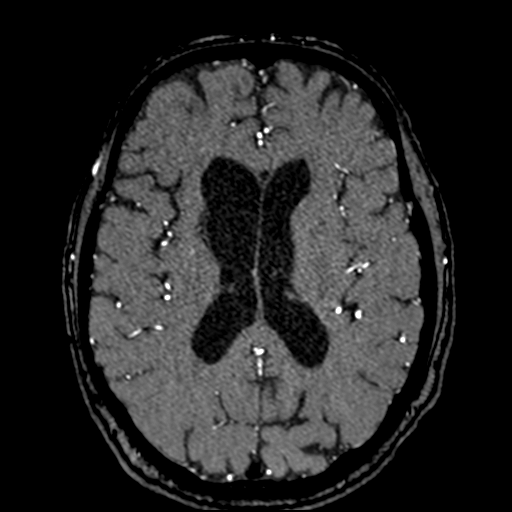
[im 146/172]
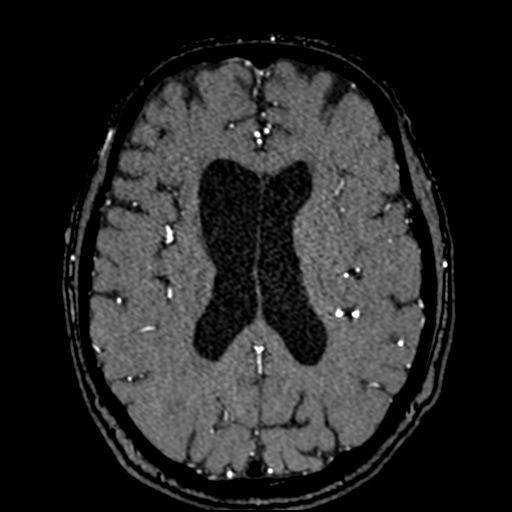
[im 164/172]
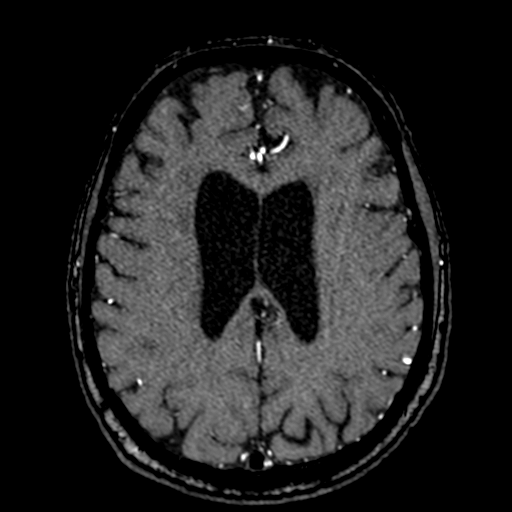

[19 of 48 positions shown; findings below may reference images not displayed]

FINDINGS: Anterior circulation: Distal cervical segments of the internal
carotid arteries are widely patent with antegrade flow. Petrous,
cavernous, and supraclinoid segments widely patent without stenosis.
A1 segments patent bilaterally. Right A1 slightly hypoplastic.
Normal anterior communicating artery complex. Anterior cerebral
arteries patent to their distal aspects without stenosis. No M1
stenosis or occlusion. Normal MCA bifurcations. Distal MCA branches
remain perfused and symmetric. Distal small vessel atheromatous
irregularity noted.

Posterior circulation: Both V4 segments remain patent to the
vertebrobasilar junction. Left vertebral artery dominant. Neither
PICA origin visualized. Basilar patent to its distal aspect without
stenosis. Superior cerebellar arteries patent bilaterally. Fetal
type origin of the right PCA which remains widely patent to its
distal aspect. Chronic left P2 occlusion again noted, stable.

Anatomic variants: Fetal type origin of the right PCA.  No aneurysm.

Other: None.
IMPRESSION: 1. Stable intracranial MRA, with no new or progressive finding.
2. Chronic left P2 occlusion, stable.
3. Mild distal small vessel atheromatous irregularity.

## 2021-03-27 MED ORDER — ATORVASTATIN CALCIUM 80 MG PO TABS
80.0000 mg | ORAL_TABLET | Freq: Every day | ORAL | Status: DC
  Administered 2021-03-27 – 2021-03-29 (×3): 80 mg via ORAL
  Filled 2021-03-27 (×3): qty 1

## 2021-03-27 MED ORDER — STROKE: EARLY STAGES OF RECOVERY BOOK
Status: AC
  Filled 2021-03-27: qty 1

## 2021-03-27 MED ORDER — ACETAMINOPHEN 160 MG/5ML PO SOLN: 650.0000 mg | ORAL | Status: DC | PRN

## 2021-03-27 MED ORDER — SENNOSIDES-DOCUSATE SODIUM 8.6-50 MG PO TABS: 1.0000 | ORAL_TABLET | Freq: Every evening | ORAL | Status: DC | PRN

## 2021-03-27 MED ORDER — ASPIRIN 81 MG PO CHEW
81.0000 mg | CHEWABLE_TABLET | Freq: Every day | ORAL | Status: DC
  Administered 2021-03-27 – 2021-03-29 (×3): 81 mg via ORAL
  Filled 2021-03-27 (×3): qty 1

## 2021-03-27 MED ORDER — ACETAMINOPHEN 650 MG RE SUPP: 650.0000 mg | RECTAL | Status: DC | PRN

## 2021-03-27 MED ORDER — ACETAMINOPHEN 325 MG PO TABS: 650.0000 mg | ORAL_TABLET | ORAL | Status: DC | PRN

## 2021-03-27 MED ORDER — STROKE: EARLY STAGES OF RECOVERY BOOK: Freq: Once | Status: AC

## 2021-03-27 MED ORDER — CLOPIDOGREL BISULFATE 75 MG PO TABS
75.0000 mg | ORAL_TABLET | Freq: Every day | ORAL | Status: DC
  Administered 2021-03-27 – 2021-03-29 (×3): 75 mg via ORAL
  Filled 2021-03-27 (×3): qty 1

## 2021-03-27 MED ORDER — MECLIZINE HCL 25 MG PO TABS
25.0000 mg | ORAL_TABLET | Freq: Once | ORAL | Status: AC
  Administered 2021-03-27: 25 mg via ORAL
  Filled 2021-03-27: qty 1

## 2021-03-27 MED ORDER — SODIUM CHLORIDE 0.9 % IV SOLN: INTRAVENOUS | Status: DC

## 2021-03-27 NOTE — Evaluation (Signed)
Physical Therapy Evaluation Patient Details Name: Isaac Stokes MRN: [id number] DOB: March 27, 1951 Today's Date: 03/27/2021   History of Present Illness  70 y/o male presented to ED on 7/29 with suddden onset of dizziness. MRI revealed punctate infarction in R temporal lobe as well as punctate infarct in posterior R frontoparietal lobe. PMH: stroke in 2003, tobacco use, recent thalamic stroke on 12/2020, tobacco use  Clinical Impression  PTA, patient lives at [facility] and reports independence with mobility. Patient currently requires supervision for safety with mobility. Patient demos mild balance deficits especially during turns. Patient presents with generalized weakness (L>R), impaired balance, decreased activity tolerance. Patient will benefit from skilled PT services during acute stay to address listed deficits. Recommend HHPT following discharge to maximize functional independence and safety.     Follow Up Recommendations Home health PT    Equipment Recommendations  None recommended by PT    Recommendations for Other Services       Precautions / Restrictions Precautions Precautions: Fall Restrictions Weight Bearing Restrictions: No      Mobility  Bed Mobility Overal bed mobility: Independent                  Transfers Overall transfer level: Needs assistance   Transfers: Sit to/from Stand Sit to Stand: Supervision         General transfer comment: supervision for safety  Ambulation/Gait Ambulation/Gait assistance: Supervision Gait Distance (Feet): 250 Feet Assistive device: None Gait Pattern/deviations: Step-through pattern;Decreased stride length;Drifts right/left     General Gait Details: supervision for safety. Mild balance deficits noted with turning  Stairs            Wheelchair Mobility    Modified Rankin (Stroke Patients Only) Modified Rankin (Stroke Patients Only) Pre-Morbid Rankin Score: No symptoms Modified Rankin:  Moderately severe disability     Balance Overall balance assessment: Mild deficits observed, not formally tested                                           Pertinent Vitals/Pain Pain Assessment: No/denies pain    Home Living Family/patient expects to be discharged to:: Private residence Living Arrangements: Alone Available Help at Discharge: Family;Available PRN/intermittently Type of Home: Apartment (senior apartment) Home Access: Elevator     Home Layout: One level Home Equipment: Shower seat;Grab bars - tub/shower Additional Comments: pull cords in bathroom and bedroom    Prior Function Level of Independence: Independent         Comments: Pt independent with ADLs, IADLs, driving, grocery shopping, and community ambulation. (Has "[employer] Meals" - box of meals delivered every 2 weeks)     Hand Dominance   Dominant Hand: Right    Extremity/Trunk Assessment   Upper Extremity Assessment Upper Extremity Assessment: Defer to OT evaluation    Lower Extremity Assessment Lower Extremity Assessment: LLE deficits/detail RLE Deficits / Details: functionally weaker than R    Cervical / Trunk Assessment Cervical / Trunk Assessment: Normal  Communication   Communication: No difficulties;HOH  Cognition Arousal/Alertness: Awake/alert Behavior During Therapy: WFL for tasks assessed/performed Overall Cognitive Status: Within Functional Limits for tasks assessed                                        General Comments  Exercises     Assessment/Plan    PT Assessment Patient needs continued PT services  PT Problem List Decreased strength;Decreased activity tolerance;Decreased balance       PT Treatment Interventions DME instruction;Gait training;Functional mobility training;Therapeutic activities;Therapeutic exercise;Balance training;Neuromuscular re-education;Patient/family education    PT Goals (Current goals can be found in the  Care Plan section)  Acute Rehab PT Goals Patient Stated Goal: To not have anymore strokes PT Goal Formulation: With patient Time For Goal Achievement: 04/10/21 Potential to Achieve Goals: Good    Frequency Min 4X/week   Barriers to discharge        Co-evaluation               AM-PAC PT "6 Clicks" Mobility  Outcome Measure Help needed turning from your back to your side while in a flat bed without using bedrails?: None Help needed moving from lying on your back to sitting on the side of a flat bed without using bedrails?: None Help needed moving to and from a bed to a chair (including a wheelchair)?: A Little Help needed standing up from a chair using your arms (e.g., wheelchair or bedside chair)?: A Little Help needed to walk in hospital room?: A Little Help needed climbing 3-5 steps with a railing? : A Little 6 Click Score: 20    End of Session Equipment Utilized During Treatment: Gait belt Activity Tolerance: Patient tolerated treatment well Patient left: in bed;with call bell/phone within reach;with bed alarm set Nurse Communication: Mobility status PT Visit Diagnosis: Unsteadiness on feet (R26.81);Muscle weakness (generalized) (M62.81)    Time: 5053-9767 PT Time Calculation (min) (ACUTE ONLY): 20 min   Charges:   PT Evaluation $PT Eval Low Complexity: 1 Low          Isaac Stokes A. Dan Humphreys PT, DPT Acute Rehabilitation Services Pager [phone] Office [phone]   Viviann Spare 03/27/2021, 4:28 PM

## 2021-03-27 NOTE — Progress Notes (Signed)
Patient placed in observation after midnight, please see H&P. Neurology consult for ? CVA: resume his aspirin and Plavix for now, for another 3 weeks. Deranged renal function-do MRA of the head and carotid Dopplers. Repeat 2D echo High-dose statin No need to recheck A1c or lipids PT OT speech therapy Permissive hypertension-allow for pressure to right high and treat only if systolics greater than 220 on a as needed basis. If telemetry inpatient remains unremarkable, will need outpatient cardiac monitoring or loop recorder placement. Stroke team to follow.  Marlin Canary DO

## 2021-03-27 NOTE — Progress Notes (Signed)
  Echocardiogram 2D Echocardiogram has been performed.  Delcie Roch 03/27/2021, 12:13 PM

## 2021-03-27 NOTE — Consult Note (Signed)
Neurology Consultation  Reason for Consult: Stroke Referring Physician: Dr. Daun Peacock  CC: Sudden onset of dizziness, nausea  History is obtained from: Chart, patient  HPI: Isaac Stokes is a 70 y.o. 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 Last known well at 9 PM on 03/26/2021 when he noted that he is feeling lightheaded and room spinning. He came to the emergency room and given the prior history of stroke, and his ongoing symptoms with nausea and vomiting, a MRI was obtained.  The MRI revealed evidence of punctate infarction in the right temporal lobe as well as punctate infarction in the posterior right frontoparietal lobe along with age-related cerebral atrophy. He was discharged in June after his stroke, with dual antiplatelet for 3 weeks which he continued and now only takes an aspirin. Has a history of tobacco abuse as well.   LKW: 9 PM on 03/26/2021 tpa given?: no, outside the window when neurology saw, mild symptoms Premorbid modified Rankin scale (mRS): 1  ROS: Full ROS was performed and is negative except as noted in the HPI.   Past Medical History:  Diagnosis Date   TIA (transient ischemic attack)    History reviewed. No pertinent family history.  Social History:   reports that he has been smoking cigarettes. He has never used smokeless tobacco. He reports that he does not drink alcohol and does not use drugs.  Medications No current facility-administered medications for this encounter.  Current Outpatient Medications:    acetaminophen (TYLENOL) 500 MG tablet, Take 1,000 mg by mouth in the morning and at bedtime., Disp: , Rfl:    aspirin 81 MG chewable tablet, Chew 1 tablet (81 mg total) by mouth daily., Disp: 30 tablet, Rfl: 1   atorvastatin (LIPITOR) 40 MG tablet, Take 1 tablet (40 mg total) by mouth at  bedtime., Disp: 30 tablet, Rfl: 1   nicotine polacrilex (NICORETTE) 2 MG gum, Take 1 each (2 mg total) by mouth as needed for smoking cessation., Disp: 100 tablet, Rfl: 0  Exam: Current vital signs: BP (!) 147/88   Pulse 66   Temp 97.8 F (36.6 C) (Oral)   Resp 16   SpO2 94%  Vital signs in last 24 hours: Temp:  [97.8 F (36.6 C)] 97.8 F (36.6 C) (07/29 2338) Pulse Rate:  [60-66] 66 (07/30 0245) Resp:  [15-18] 16 (07/30 0245) BP: (147-177)/(88-99) 147/88 (07/30 0245) SpO2:  [94 %-100 %] 94 % (07/30 0245) GENERAL: Awake, alert in NAD HEENT: - Normocephalic and atraumatic, dry mm, no LN++, no Thyromegally LUNGS - Clear to auscultation bilaterally with no wheezes CV - S1S2 RRR, no m/r/g, equal pulses bilaterally. ABDOMEN - Soft, nontender, nondistended with normoactive BS Ext: warm, well perfused, intact peripheral pulses, no edema  NEURO:  Mental Status: AA&Ox3  Language: speech is nondysarthric.  Naming, repetition, fluency, and comprehension intact. Cranial Nerves: PERRL EOMI, visual fields full, no facial asymmetry, facial sensation intact, hearing intact, tongue/uvula/soft palate midline, normal sternocleidomastoid and trapezius muscle strength. No evidence of tongue atrophy or fibrillations Motor: No drift in any of the 4 extremities Tone: is normal and bulk is normal Sensation-diminished on left leg comparison to the right.  Otherwise intact Coordination: FTN intact bilaterally, no ataxia in BLE. Gait- deferred  NIHSS-1 for sensory   Labs I have reviewed labs in epic and the results pertinent to  this consultation are: CBC    Component Value Date/Time   WBC 9.8 03/26/2021 2337   RBC 3.87 (L) 03/26/2021 2337   HGB 11.9 (L) 03/27/2021 0003   HCT 35.0 (L) 03/27/2021 0003   PLT 196 03/26/2021 2337   MCV 95.9 03/26/2021 2337   MCH 30.7 03/26/2021 2337   MCHC 32.1 03/26/2021 2337   RDW 12.5 03/26/2021 2337   LYMPHSABS 1.4 03/26/2021 2337   MONOABS 0.6 03/26/2021  2337   EOSABS 0.1 03/26/2021 2337   BASOSABS 0.1 03/26/2021 2337    CMP     Component Value Date/Time   NA 139 03/27/2021 0003   K 4.3 03/27/2021 0003   CL 108 03/27/2021 0003   CO2 23 03/26/2021 2337   GLUCOSE 112 (H) 03/27/2021 0003   BUN 29 (H) 03/27/2021 0003   CREATININE 1.70 (H) 03/27/2021 0003   CALCIUM 8.8 (L) 03/26/2021 2337   PROT 6.2 (L) 03/26/2021 2337   ALBUMIN 3.5 03/26/2021 2337   AST 18 03/26/2021 2337   ALT 10 03/26/2021 2337   ALKPHOS 44 03/26/2021 2337   BILITOT 0.7 03/26/2021 2337   GFRNONAA 44 (L) 03/26/2021 2337    Lipid Panel     Component Value Date/Time   CHOL 234 (H) 01/27/2021 0310   TRIG 140 01/27/2021 0310   HDL 40 (L) 01/27/2021 0310   CHOLHDL 5.9 01/27/2021 0310   VLDL 28 01/27/2021 0310   LDLCALC 166 (H) 01/27/2021 0310  Jan 26, 2021 labs/January 27, 2021 labs A1c 5.5 LDL 166  Imaging I have reviewed the images obtained: MRI brain done today reveals 3 mm acute ischemic nonhemorrhagic right temporal lobe infarction along with additional possibly tiny acute ischemic nonhemorrhagic posterior right frontotemporal infarction.  Underlying age-related cerebral atrophy with moderate chronic small vessel disease.  Chronic right basal ganglia infarct with hemosiderin staining, encephalomalacia gliosis involving the left occipital lobe consistent with a chronic left PCA distribution infarct.  MRI from Jan 26, 2021 and MRA from the same date: Small acute infarct in the lateral right thalamus.  No hemorrhage.  Normal vertebral arteries, normal inferior cerebral arteries, normal vascular.  Normal superior cerebral arteries.  Left PCA occluded at P1 to junction.  Normal right PCA.  Normal anterior circulation Fetal right PCA.  Assessment: 70 year old with past history of stroke in 2003, tobacco abuse, recent thalamic stroke with residual left-sided numbness presenting with sudden onset of dizziness noted to new punctate areas of infarction in the right  temporal lobe as well as right frontotemporal region. Based on the vessel imaging-MRA head from 04/28/2021, no significant stenosis noted in that vascular distribution. 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 2D echocardiogram done during last admission was unremarkable for cardiac process. Was started on dual antiplatelet discharged home and currently only on aspirin. Had a recent right thalamic infarction and now has right hemispheric infarction that is right anterior circulation making this suspicious for cardioembolic etiology-versus concomitant small vessel disease given his small vessel risk factors.  That said, prior stroke in the left PCA territory with a PCA occlusion on the left and now right anterior and posterior circulation strokes do warrant a detailed cardioembolic work-up   Impression: Acute ischemic stroke-likely embolic versus concomitant small vessel disease  Recommendations: Admit to hospitalist Frequent neurochecks I would resume his aspirin and Plavix for now, for another 3 weeks. Deranged renal function-I would do MRA of the head and carotid Dopplers. Repeat  2D echo High-dose statin No need to recheck A1c or lipids PT OT speech therapy Permissive hypertension-allow for pressure to right high and treat only if systolics greater than 220 on a as needed basis. If telemetry inpatient remains unremarkable, will need outpatient cardiac monitoring or loop recorder placement. Stroke team to follow. D/ W Dr Nicanor Alcon in the ER   - Milon Dikes, MD Neurologist [facility] Pager: [phone]

## 2021-03-27 NOTE — Progress Notes (Signed)
Occupational Therapy Evaluation Patient Details Name: Isaac Stokes MRN: [id number] DOB: [birth year] Today's Date: 03/27/2021    History of Present Illness 70 year old with past history of stroke in 2003, tobacco abuse, recent thalamic stroke with residual left-sided numbness presenting with sudden onset of dizziness noted to new punctate areas of infarction in the right temporal lobe as well as right frontotemporal region.VVZ:SMOLM insufficiency, polyuria, CVA 5/22.   Clinical Impression   PTA pt lives independently at a Senior apartment complex. No nausea/dizziness associated with mobility during assessment and VSS. Pt states "I just feel a little light headed...but I vomited last night and didn't sleep". Pt continues to have L sided "numbness" from CVA in May, however is most likely close to baseline level of function. Scored a 4 on the [employer], which places him in the "normal" (0-4) range. Would benefit from further assessment of executive level cognitive skills.Pt is enrolled in "[employer]", which provides a box of food every 2 weeks. Pt states his brother and nephew could "most likely check on him'. Would benefit from a "Fall Alert" system. Will follow acutely to facilitate a safe DC home.   Pt expressing concerns over having another CVA   Follow Up Recommendations  No OT follow up;Supervision - Intermittent    Equipment Recommendations  None recommended by OT    Recommendations for Other Services PT consult     Precautions / Restrictions Precautions Precautions: Fall      Mobility Bed Mobility Overal bed mobility: Independent                  Transfers Overall transfer level: Needs assistance   Transfers: Sit to/from Stand Sit to Stand: Supervision              Balance Overall balance assessment: Needs assistance   Sitting balance-Leahy Scale: Good       Standing balance-Leahy Scale: Fair                             ADL  either performed or assessed with clinical judgement   ADL Overall ADL's : Needs assistance/impaired     Grooming: Modified independent   Upper Body Bathing: Modified independent   Lower Body Bathing: Supervison/ safety   Upper Body Dressing : Modified independent   Lower Body Dressing: Supervision/safety   Toilet Transfer: Min guard   Toileting- Clothing Manipulation and Hygiene: Supervision/safety       Functional mobility during ADLs: Min guard General ADL Comments: initially minimally unsteady. Most likely form immobility adn not sleeing last night     Vision Baseline Vision/History: Wears glasses Wears Glasses: Reading only Patient Visual Report: No change from baseline Vision Assessment?: Yes Eye Alignment: Within Functional Limits Ocular Range of Motion: Within Functional Limits Alignment/Gaze Preference: Within Defined Limits Tracking/Visual Pursuits: Decreased smoothness of horizontal tracking;Decreased smoothness of vertical tracking Saccades: Additional eye shifts occurred during testing Visual Fields: No apparent deficits Additional Comments: Diffculty sustaining visual attention during smooth pursuits     Perception Perception Comments: no apparnet deficits   Praxis Praxis Praxis tested?: Within functional limits    Pertinent Vitals/Pain Pain Assessment: No/denies pain     Hand Dominance Right   Extremity/Trunk Assessment Upper Extremity Assessment Upper Extremity Assessment: Overall WFL for tasks assessed   Lower Extremity Assessment Lower Extremity Assessment: Defer to PT evaluation   Cervical / Trunk Assessment Cervical / Trunk Assessment: Normal   Communication Communication Communication: No difficulties;HOH  Cognition Arousal/Alertness: Awake/alert Behavior During Therapy: WFL for tasks assessed/performed Overall Cognitive Status: No family/caregiver present to determine baseline cognitive functioning      General Comments:  appears at baseline; Scored a 4 on the [employer]	[place] which places him in the "normal" range (0-4); able to discuss his medical history and management of his medications; demonstrates good insight into being concerned about having more strokes; able to self correct mistakes; demonstrates awareness and insight into current medical situation   General Comments       Exercises     Shoulder Instructions      Home Living Family/patient expects to be discharged to:: Private residence Living Arrangements: Alone Available Help at Discharge: Family;Available PRN/intermittently Type of Home: Apartment Environmental manager apt) Home Access: Elevator     Home Layout: One level     Bathroom Shower/Tub: Chief Strategy Officer: Standard Bathroom Accessibility: Yes How Accessible: Accessible via walker Home Equipment: Shower seat;Grab bars - tub/shower   Additional Comments: pull cords in bathroom and bedroom      Prior Functioning/Environment Level of Independence: Independent        Comments: Pt independent with ADLs, IADLs, driving, grocery shopping, and community ambulation. (Has "[employer]" - box of meals delivered every 2 weeks)        OT Problem List: Decreased safety awareness;Decreased activity tolerance      OT Treatment/Interventions: Self-care/ADL training;Therapeutic activities;Cognitive remediation/compensation;Visual/perceptual remediation/compensation;Balance training    OT Goals(Current goals can be found in the care plan section) Acute Rehab OT Goals Patient Stated Goal: To not have anymore strokes OT Goal Formulation: With patient Time For Goal Achievement: 04/10/21 Potential to Achieve Goals: Good  OT Frequency: Min 2X/week   Barriers to D/C:            Co-evaluation              AM-PAC OT "6 Clicks" Daily Activity     Outcome Measure Help from another person eating meals?: None Help from another person taking care of personal grooming?:  None Help from another person toileting, which includes using toliet, bedpan, or urinal?: A Little Help from another person bathing (including washing, rinsing, drying)?: A Little Help from another person to put on and taking off regular upper body clothing?: A Little Help from another person to put on and taking off regular lower body clothing?: A Little 6 Click Score: 20   End of Session Equipment Utilized During Treatment: Gait belt Nurse Communication: Mobility status;Other (comment) (pt asking for food)  Activity Tolerance: Patient tolerated treatment well Patient left: in bed;with call bell/phone within reach  OT Visit Diagnosis: Unsteadiness on feet (R26.81);Dizziness and giddiness (R42)                Time: 9518-8416 OT Time Calculation (min): 24 min Charges:  OT General Charges $OT Visit: 1 Visit OT Evaluation $OT Eval Low Complexity: 1 Low  Jaquila Santelli, OT/L   Acute OT Clinical Specialist Acute Rehabilitation Services Pager [phone] Office [phone]   [facility] 03/27/2021, 10:53 AM

## 2021-03-27 NOTE — ED Provider Notes (Signed)
[facility] EMERGENCY DEPARTMENT Provider Note   CSN: [id number] Arrival date & time: 03/26/21  2320     History Chief Complaint  Patient presents with   Dizziness    Isaac Stokes is a 70 y.o. male.  The history is provided by the patient.  Dizziness Quality:  Head spinning Severity:  Severe Onset quality:  Sudden Duration:  2 hours Timing:  Constant Progression:  Unchanged Chronicity:  New Context: not with medication   Relieved by:  Nothing Worsened by:  Nothing Ineffective treatments:  None tried Associated symptoms: nausea and vomiting   Associated symptoms: no chest pain, no vision changes and no weakness   Risk factors: hx of stroke   Risk factors: no hx of vertigo       Past Medical History:  Diagnosis Date   TIA (transient ischemic attack)     Patient Active Problem List   Diagnosis Date Noted   Renal insufficiency 01/27/2021   Polyuria 01/27/2021   Chest pain 01/27/2021   Tobacco use 01/27/2021   Acute CVA (cerebrovascular accident) (HCC) 01/26/2021    History reviewed. No pertinent surgical history.     History reviewed. No pertinent family history.  Social History   Tobacco Use   Smoking status: Light Smoker    Types: Cigarettes   Smokeless tobacco: Never  Substance Use Topics   Alcohol use: Never   Drug use: Never    Home Medications Prior to Admission medications   Medication Sig Start Date End Date Taking? Authorizing Provider  acetaminophen (TYLENOL) 500 MG tablet Take 1,000 mg by mouth in the morning and at bedtime.    [provider]  aspirin 81 MG chewable tablet Chew 1 tablet (81 mg total) by mouth daily. 01/29/21 03/30/21  Zigmund Daniel., MD  atorvastatin (LIPITOR) 40 MG tablet Take 1 tablet (40 mg total) by mouth at bedtime. 01/28/21 03/29/21  Zigmund Daniel., MD  nicotine polacrilex (NICORETTE) 2 MG gum Take 1 each (2 mg total) by mouth as needed for smoking cessation. 01/28/21   Zigmund Daniel., MD    Allergies    Patient has no known allergies.  Review of Systems   Review of Systems  Unable to perform ROS: Acuity of condition  Constitutional:  Negative for fever.  HENT:  Negative for facial swelling.   Eyes:  Negative for redness.  Respiratory:  Negative for wheezing.   Cardiovascular:  Negative for chest pain and leg swelling.  Gastrointestinal:  Positive for nausea and vomiting.  Genitourinary:  Negative for difficulty urinating.  Musculoskeletal:  Negative for neck stiffness.  Skin:  Negative for rash.  Neurological:  Positive for dizziness. Negative for facial asymmetry and weakness.  Psychiatric/Behavioral:  Negative for agitation.   All other systems reviewed and are negative.  Physical Exam Updated Vital Signs BP (!) 177/99   Pulse 60   Temp 97.8 F (36.6 C) (Oral)   Resp 17   SpO2 98%   Physical Exam Vitals and nursing note reviewed.  Constitutional:      General: He is not in acute distress.    Appearance: Normal appearance.  HENT:     Head: Normocephalic and atraumatic.     Nose: Nose normal.     Mouth/Throat:     Mouth: Mucous membranes are moist.  Eyes:     Extraocular Movements: Extraocular movements intact.     Conjunctiva/sclera: Conjunctivae normal.     Pupils: Pupils are equal, round,  and reactive to light.  Cardiovascular:     Rate and Rhythm: Normal rate and regular rhythm.     Pulses: Normal pulses.     Heart sounds: Normal heart sounds.  Pulmonary:     Effort: Pulmonary effort is normal.     Breath sounds: Normal breath sounds.  Abdominal:     General: Abdomen is flat. Bowel sounds are normal.     Palpations: Abdomen is soft.     Tenderness: There is no abdominal tenderness. There is no guarding.  Musculoskeletal:        General: Normal range of motion.     Cervical back: Normal range of motion and neck supple.  Skin:    General: Skin is warm and dry.     Capillary Refill: Capillary refill takes less than 2  seconds.  Neurological:     Mental Status: He is alert.     Cranial Nerves: No cranial nerve deficit.     Deep Tendon Reflexes: Reflexes normal.  Psychiatric:        Mood and Affect: Mood normal.        Behavior: Behavior normal.    ED Results / Procedures / Treatments   Labs (all labs ordered are listed, but only abnormal results are displayed)   EKG None  Radiology CT HEAD WO CONTRAST  Result Date: 03/26/2021 CLINICAL DATA:  Stroke, follow up Dizziness tonight.  Episode of vomiting. EXAM: CT HEAD WITHOUT CONTRAST TECHNIQUE: Contiguous axial images were obtained from the base of the skull through the vertex without intravenous contrast. COMPARISON:  Head CT and brain MRI 01/26/2021 FINDINGS: Brain: No hemorrhage or evidence of acute ischemia. Remote left occipital infarct is unchanged. Remote right thalamic infarct is unchanged. Small remote lacunar infarct in the posterior limb of the right internal capsule. Chronic mild ex vacuo dilatation of the right lateral ventricle. No hydrocephalus. Chronic periventricular chronic small vessel ischemia. No subdural or extra-axial collection. Vascular: No hyperdense vessel. Skull: No fracture or focal lesion. Sinuses/Orbits: Paranasal sinuses and mastoid air cells are clear. The visualized orbits are unremarkable. Other: None. IMPRESSION: 1. No acute intracranial abnormality. 2. Remote infarcts in the left occipital lobe, right thalamus, and posterior limb of the right internal capsule. Chronic small vessel ischemia. Electronically Signed   By: Narda Rutherford M.D.   On: 03/26/2021 23:58    Procedures Procedures   Medications Ordered in ED Medications - No data to display  ED Course  I have reviewed the triage vital signs and the nursing notes.  Pertinent labs & imaging results that were available during my care of the patient were reviewed by me and considered in my medical decision making (see chart for details).  Case d/w Dr. Wilford Corner,  admit as stroke to medicine  Final Clinical Impression(s) / ED Diagnoses Final diagnoses:  None   Admit to medicine  Rx / DC Orders ED Discharge Orders     None        Joycelynn Fritsche, MD 03/27/21 [phone]

## 2021-03-27 NOTE — Progress Notes (Signed)
Patient had recent echo for stroke 01/27/21.   Please advise echo department if echo needs repeating.

## 2021-03-27 NOTE — H&P (Signed)
History and Physical    Isaac Stokes:[id number] DOB: 1951-04-12 DOA: 03/26/2021  PCP: Pcp, No   Patient coming from: Home  Chief Complaint: Dizziness, nausea  HPI: Isaac Stokes is a 70 y.o. male with medical history significant for TIA, HLD, tobacco use who presents with complaint of dizziness and generalized weakness.  He reports he had a stroke in May 2022 with residual numbness in his left leg that is unchanged.  He reports that tonight he went outside to smoke a cigarette and he had a sudden episode of dizziness.  He states he felt like there then was spinning around him and he became a little nauseous and weak and had to sit down.  Reports symptoms started around 9 PM on March 26, 2021.  He did not have any loss of consciousness and no seizure activity.  He denies any chest pain or palpitations.  He did not have any vomiting, abdominal pain, fever, chills or urinary symptoms.  He reports that he felt like his speech was thick for a few minutes when the dizziness first started but has improved and is back to its baseline.  After his stroke in May he was on dual antiplatelet therapy for 3 weeks and now he is only taking aspirin daily. He continues to smoke cigarettes but he is trying to quit and he is down to 6 cigarettes a day.  He used to smoke 2-1/4 pack a day.  Denies illicit drug use or alcohol use.  ED Course: The emergency room patient is found to have acute stroke on MRI.  He has been seen by neurology and recommendations are reviewed.  Patient has stable CKD with a creatinine of 1.70 with a baseline of 1.55-1.65.  Electrolytes are normal.  CBC is unremarkable.  Hospitalist service been asked to evaluate patient for further work-up  Review of Systems:  General: Reports dizziness and weakness. Denies fever, chills, weight loss, night sweats. Denies change in appetite HENT: Denies head trauma, headache, denies change in hearing, tinnitus.  Denies nasal congestion or bleeding.   Denies sore throat, sores in mouth.  Denies difficulty swallowing Eyes: Denies blurry vision, pain in eye, drainage.  Denies discoloration of eyes. Neck: Denies pain.  Denies swelling.  Denies pain with movement. Cardiovascular: Denies chest pain, palpitations.  Denies edema.  Denies orthopnea Respiratory: Denies shortness of breath, cough.  Denies wheezing.  Denies sputum production Gastrointestinal: Denies abdominal pain, swelling.  Denies nausea, vomiting, diarrhea.  Denies melena.  Denies hematemesis. Musculoskeletal: Denies limitation of movement.  Denies deformity or swelling.  Denies arthralgias or myalgias. Genitourinary: Denies pelvic pain.  Denies urinary frequency or hesitancy.  Denies dysuria.  Skin: Denies rash.  Denies petechiae, purpura, ecchymosis. Neurological: Denies syncope.  Denies seizure activity.  Denies paresthesia. Denies drooping face. Denies visual change. Psychiatric: Denies depression, anxiety.  Denies hallucinations.  Past Medical History:  Diagnosis Date   TIA (transient ischemic attack)     History reviewed. No pertinent surgical history.  Social History  reports that he has been smoking cigarettes. He has never used smokeless tobacco. He reports that he does not drink alcohol and does not use drugs.  No Known Allergies  History reviewed. No pertinent family history.   Prior to Admission medications   Medication Sig Start Date End Date Taking? Authorizing Provider  acetaminophen (TYLENOL) 500 MG tablet Take 1,000 mg by mouth in the morning and at bedtime.    [provider]  aspirin 81 MG chewable tablet  Chew 1 tablet (81 mg total) by mouth daily. 01/29/21 03/30/21  [name], A Caldwell Jr., MD  atorvastatin (LIPITOR) 40 MG tablet Take 1 tablet (40 mg total) by mouth at bedtime. 01/28/21 03/29/21  [name], A Caldwell Jr., MD  nicotine polacrilex (NICORETTE) 2 MG gum Take 1 each (2 mg total) by mouth as needed for smoking cessation. 01/28/21   [name], A  Caldwell Jr., MD    Physical Exam: Vitals:   03/27/21 0330 03/27/21 0400 03/27/21 0430 03/27/21 0500  BP: (!) 152/88 (!) 148/93 (!) 141/84 (!) 149/82  Pulse: 67 64 62 71  Resp: 18 17 15 12   Temp:      TempSrc:      SpO2: 96% 97% 96% 97%    Constitutional: NAD, calm, comfortable Vitals:   03/27/21 0330 03/27/21 0400 03/27/21 0430 03/27/21 0500  BP: (!) 152/88 (!) 148/93 (!) 141/84 (!) 149/82  Pulse: 67 64 62 71  Resp: 18 17 15 12   Temp:      TempSrc:      SpO2: 96% 97% 96% 97%   General: WDWN, Alert and oriented x3.  Eyes: EOMI, PERRL, conjunctivae normal.  Sclera nonicteric HENT:  [place]/AT, external ears normal.  Nares patent without epistasis.  Mucous membranes are moist.  Neck: Soft, normal range of motion, supple, no masses, Trachea midline Respiratory: clear to auscultation bilaterally, no wheezing, no crackles. Normal respiratory effort. No accessory muscle use.  Cardiovascular: Regular rate and rhythm, no murmurs / rubs / gallops. No extremity edema. 2+ pedal pulses. No carotid bruits.  Abdomen: Soft, no tenderness, nondistended, no rebound or guarding.  No masses palpated. Bowel sounds normoactive Musculoskeletal: FROM. no  cyanosis. No joint deformity upper and lower extremities. Normal muscle tone.  Skin: Warm, dry, intact no rashes, lesions, ulcers. No induration Neurologic: CN 2-12 grossly intact.  Normal speech.  Sensation intact to touch, patella DTR +1 bilaterally. Strength 5/5 in all extremities.  Normal heel to shin. No pronator drift.  Psychiatric: Normal judgment and insight.  Normal mood.    Labs on Admission: I have personally reviewed following labs and imaging studies  CBC: Recent Labs  Lab 03/26/21 2337 03/27/21 0003  WBC 9.8  --   NEUTROABS 7.6  --   HGB 11.9* 11.9*  HCT 37.1* 35.0*  MCV 95.9  --   PLT 196  --     Basic Metabolic Panel: Recent Labs  Lab 03/26/21 2337 03/27/21 0003  NA 138 139  K 4.4 4.3  CL 107 108  CO2 23  --    GLUCOSE 115* 112*  BUN 27* 29*  CREATININE 1.64* 1.70*  CALCIUM 8.8*  --     GFR: CrCl cannot be calculated (Unknown ideal weight.).  Liver Function Tests: Recent Labs  Lab 03/26/21 2337  AST 18  ALT 10  ALKPHOS 44  BILITOT 0.7  PROT 6.2*  ALBUMIN 3.5    Urine analysis:    Component Value Date/Time   COLORURINE YELLOW 03/27/2021 0114   APPEARANCEUR CLEAR 03/27/2021 0114   LABSPEC 1.014 03/27/2021 0114   PHURINE 7.0 03/27/2021 0114   GLUCOSEU NEGATIVE 03/27/2021 0114   HGBUR NEGATIVE 03/27/2021 0114   BILIRUBINUR NEGATIVE 03/27/2021 0114   KETONESUR NEGATIVE 03/27/2021 0114   PROTEINUR NEGATIVE 03/27/2021 0114   NITRITE NEGATIVE 03/27/2021 0114   LEUKOCYTESUR NEGATIVE 03/27/2021 0114    Radiological Exams on Admission: CT HEAD WO CONTRAST  Result Date: 03/26/2021 CLINICAL DATA:  Stroke, follow up Dizziness tonight.  Episode of vomiting. EXAM:  CT HEAD WITHOUT CONTRAST TECHNIQUE: Contiguous axial images were obtained from the base of the skull through the vertex without intravenous contrast. COMPARISON:  Head CT and brain MRI 01/26/2021 FINDINGS: Brain: No hemorrhage or evidence of acute ischemia. Remote left occipital infarct is unchanged. Remote right thalamic infarct is unchanged. Small remote lacunar infarct in the posterior limb of the right internal capsule. Chronic mild ex vacuo dilatation of the right lateral ventricle. No hydrocephalus. Chronic periventricular chronic small vessel ischemia. No subdural or extra-axial collection. Vascular: No hyperdense vessel. Skull: No fracture or focal lesion. Sinuses/Orbits: Paranasal sinuses and mastoid air cells are clear. The visualized orbits are unremarkable. Other: None. IMPRESSION: 1. No acute intracranial abnormality. 2. Remote infarcts in the left occipital lobe, right thalamus, and posterior limb of the right internal capsule. Chronic small vessel ischemia. Electronically Signed   By: Narda Rutherford M.D.   On: 03/26/2021  23:58   MR BRAIN WO CONTRAST  Result Date: 03/27/2021 CLINICAL DATA:  Initial evaluation for neuro deficit, stroke suspected. EXAM: MRI HEAD WITHOUT CONTRAST TECHNIQUE: Multiplanar, multiecho pulse sequences of the brain and surrounding structures were obtained without intravenous contrast. COMPARISON:  CT from 03/26/2021 and MRI from 01/26/2021. FINDINGS: Brain: Generalized age-related cerebral atrophy with moderate chronic microvascular ischemic disease. Interval evolution of previously identified right thalamic infarct, now chronic in appearance. Additional chronic right basal ganglia infarct with associated chronic hemosiderin staining. Wallerian degeneration at the right midbrain. Encephalomalacia and gliosis involving the left occipital lobe consistent with a chronic left PCA distribution infarct. Few tiny remote lacunar infarcts noted about the left thalamus as well. Punctate 3 mm focus of restricted diffusion seen involving the mid right temporal lobe, consistent with a tiny acute ischemic infarct (series 3, image 22). No associated hemorrhage or mass effect. Additional possible tiny acute ischemic infarct at the posterior right frontal parietal region as well (series 3, image 35). No associated hemorrhage. No other diffusion abnormality to suggest acute or subacute ischemia. No acute intracranial hemorrhage. Few scattered chronic micro hemorrhages noted clustered about the anterior right temporal pole, stable. No mass lesion, midline shift or mass effect. Mild ex vacuo dilatation of the right lateral ventricle without hydrocephalus. No extra-axial fluid collection. Pituitary gland and suprasellar region within normal limits. Subcentimeter benign appearing pineal cyst noted of doubtful significance. Vascular: Major intracranial vascular flow voids are maintained. Skull and upper cervical spine: Craniocervical junction within normal limits. Bone marrow signal intensity normal. No scalp soft tissue  abnormality. Sinuses/Orbits: Globes and orbital soft tissues demonstrate no acute finding. Paranasal sinuses are largely clear. Trace bilateral mastoid effusions noted, of doubtful significance. Visualized nasopharynx unremarkable. Inner ear structures grossly within normal limits. Other: None. IMPRESSION: 1. 3 mm acute ischemic nonhemorrhagic right temporal lobe infarct. 2. Additional possible tiny acute ischemic nonhemorrhagic posterior right frontoparietal infarct as above. 3. Underlying age-related cerebral atrophy with moderate chronic microvascular ischemic disease, with additional chronic ischemic infarcts as above. Electronically Signed   By: Stokes Mu M.D.   On: 03/27/2021 03:22    EKG: Independently reviewed.  EKG shows normal sinus rhythm with no acute ST elevation or depression.  QTc 434  Assessment/Plan Principal Problem:   CVA (cerebral vascular accident)  Isaac Stokes will be observed on medical telemetry floor for TIA/CVA.  Will obtain MRAof brain to evaluate for LVO.  Will evaluate carotids for large vessel occlusion/stenosis. Obtain echocardiogram to evaluate for PFO, wall motion and ejection fraction. Pt had recent echo but neurology recommends repeating at this time.  Hypertension of 220/110 will be allowed for 24 hours per stroke protocol.  After which blood pressure will be slowly reduced to goal level. Antiplatelet therapy with aspirin and plavix for next 3 weeks. Continue statin therapy with high intensity statin, lipitor increased to 80 mg a day. Pt had lipid panel last month so would not be repeated at this time.  Neurochecks per stroke protocol  Active Problems:   CKD (chronic kidney disease) stage 3, GFR 30-59 ml/min  Stable kidney function.  Monitor electrolytes and renal function with labs in morning    DVT prophylaxis: SCDs for DVT prophylaxis  Code Status:   Full Code  Family Communication:  Diagnosis and plan discussed with patient was understanding  agrees with plan.  Questions answered.  Further recommendations to follow as clinically indicated Disposition Plan:   Patient is from:  Home  Anticipated DC to:  Home  Anticipated DC date:  Anticipate less than 2 midnight stay in the hospital  Consults:  Neurology Admission status:  Observation   Claudean Severance Kiaraliz Rafuse MD [facility]  How to contact the [facility] Attending or Consulting provider 7A - 7P or covering provider during after hours 7P -7A, for this patient?   Check the care team in [facility] and look for a) attending/consulting [facility] provider listed and b) the [facility] team listed Log into [email or web address] and use [place]'s universal password to access. If you do not have the password, please contact the hospital operator. Locate the [facility] provider you are looking for under [facility] and page to a number that you can be directly reached. If you still have difficulty reaching the provider, please page the [facility] (Director on Call) for the Hospitalists listed on amion for assistance.  03/27/2021, 6:13 AM

## 2021-03-28 ENCOUNTER — Observation Stay (HOSPITAL_BASED_OUTPATIENT_CLINIC_OR_DEPARTMENT_OTHER): Payer: Medicare Other

## 2021-03-28 DIAGNOSIS — I639 Cerebral infarction, unspecified: Secondary | ICD-10-CM | POA: Diagnosis not present

## 2021-03-28 DIAGNOSIS — Q211 Atrial septal defect: Secondary | ICD-10-CM | POA: Diagnosis not present

## 2021-03-28 LAB — BASIC METABOLIC PANEL
Anion gap: 6 (ref 5–15)
BUN: 31 mg/dL — ABNORMAL HIGH (ref 8–23)
CO2: 28 mmol/L (ref 22–32)
Calcium: 8.8 mg/dL — ABNORMAL LOW (ref 8.9–10.3)
Chloride: 103 mmol/L (ref 98–111)
Creatinine, Ser: 1.81 mg/dL — ABNORMAL HIGH (ref 0.61–1.24)
GFR, Estimated: 39 mL/min — ABNORMAL LOW (ref >60)
Glucose, Bld: 98 mg/dL (ref 70–99)
Potassium: 4.6 mmol/L (ref 3.5–5.1)
Sodium: 137 mmol/L (ref 135–145)

## 2021-03-28 LAB — CBC
HCT: 33.1 % — ABNORMAL LOW (ref 39.0–52.0)
Hemoglobin: 10.6 g/dL — ABNORMAL LOW (ref 13.0–17.0)
MCH: 30.3 pg (ref 26.0–34.0)
MCHC: 32 g/dL (ref 30.0–36.0)
MCV: 94.6 fL (ref 80.0–100.0)
Platelets: 182 10*3/uL (ref 150–400)
RBC: 3.5 MIL/uL — ABNORMAL LOW (ref 4.22–5.81)
RDW: 12.8 % (ref 11.5–15.5)
WBC: 7.3 10*3/uL (ref 4.0–10.5)
nRBC: 0 % (ref 0.0–0.2)

## 2021-03-28 NOTE — Plan of Care (Signed)
  Problem: Activity: Goal: Risk for activity intolerance will decrease Outcome: Progressing   Problem: Nutrition: Goal: Adequate nutrition will be maintained Outcome: Progressing   Problem: Safety: Goal: Ability to remain free from injury will improve Outcome: Progressing   

## 2021-03-28 NOTE — TOC Initial Note (Addendum)
Transition of Care [facility]) - Initial/Assessment Note    Patient Details  Name: Isaac Stokes MRN: [id number] Date of Birth: [birth year]  [facility] CM/SW Contact:    Kermit Balo, RN Phone Number: 03/28/2021, 10:16 AM  Clinical Narrative:        PCP: [facility].          Patient lives at home alone but has a brother than can check on him occasionally.  Recommendations for [facility] services. Pt has uses [facility] in the past and would like to use them again. Information on the AVS.  No new DME needs.  Pt uses transportation for appts or drives very short distances. Pt manages his own meds at home and denies any issues.  Pt states brother will provide transport home when ready for d/c.   Expected Discharge Plan: Home w Home Health Services Barriers to Discharge: Continued Medical Work up   Patient Goals and CMS Choice   CMS Medicare.gov Compare Post Acute Care list provided to:: Patient Choice offered to / list presented to : Patient  Expected Discharge Plan and Services Expected Discharge Plan: Home w Home Health Services   Discharge Planning Services: CM Consult Post Acute Care Choice: Home Health Living arrangements for the past 2 months: Apartment                           HH Arranged: PT HH Agency: [facility] Date [facility] Agency Contacted: 03/28/21   Representative spoke with at [facility] Agency: Amy  Prior Living Arrangements/Services Living arrangements for the past 2 months: Apartment Lives with:: Self Patient language and need for interpreter reviewed:: Yes Do you feel safe going back to the place where you live?: Yes        Care giver support system in place?: No (comment) Current home services: DME (shower seat) Criminal Activity/Legal Involvement Pertinent to Current Situation/Hospitalization: No - Comment as needed  Activities of Daily Living      Permission Sought/Granted                  Emotional Assessment Appearance::  Appears stated age Attitude/Demeanor/Rapport: Engaged Affect (typically observed): Accepting Orientation: : Oriented to Self, Oriented to Place, Oriented to  Time, Oriented to Situation   Psych Involvement: No (comment)  Admission diagnosis:  Vertigo [R42] CVA (cerebral vascular accident) [facility] [I63.9] Cerebrovascular accident (CVA), unspecified mechanism (HCC) [I63.9] Patient Active Problem List   Diagnosis Date Noted   CVA (cerebral vascular accident) (HCC) 03/27/2021   CKD (chronic kidney disease) stage 3, GFR 30-59 ml/min (HCC) 03/27/2021   Renal insufficiency 01/27/2021   Polyuria 01/27/2021   Chest pain 01/27/2021   Tobacco use 01/27/2021   Acute CVA (cerebrovascular accident) (HCC) 01/26/2021   PCP:  Oneita Hurt, No Pharmacy:   [id number] - [place], [place] - [address] AT [facility] OF [address] [place] [address] Phone: [phone] Fax: [phone]     Social Determinants of Health (SDOH) Interventions    Readmission Risk Interventions No flowsheet data found.

## 2021-03-28 NOTE — Progress Notes (Signed)
VASCULAR LAB    Bilateral lower extremity venous duplex has been performed.  See CV proc for preliminary results.   Edell Mesenbrink, RVT 03/28/2021, 5:38 PM

## 2021-03-28 NOTE — Progress Notes (Signed)
Subjective: 70 y.o. returns the office today for painful, elongated, thickened toenails which he  cannot trim himself. Denies any redness or drainage around the nails.  He has been using Penlac he does not like it because it is a liquid and it spills.  He is asking for something and the pen version.  He still has the bunions but states that he has had this for over 40 years and previously saw Dr.'s for this and would not do surgery.  Denies any acute changes since last appointment and no new complaints today. Denies any systemic complaints such as fevers, chills, nausea, vomiting.   PCP: Pcp, No  Objective: AAO 3, NAD DP/PT pulses palpable, CRT less than 3 seconds Nails hypertrophic, dystrophic, elongated, brittle, discolored 10. There is tenderness overlying the nails 1-5 bilaterally. There is no surrounding erythema or drainage along the nail sites. Bunions are present bilaterally with faint erythema from irritation there is no skin breakdown, warmth or open sores. No open lesions or pre-ulcerative lesions are identified. No other areas of tenderness bilateral lower extremities. No overlying edema, erythema, increased warmth. No pain with calf compression, swelling, warmth, erythema.  Assessment: Patient presents with symptomatic onychomycosis  Plan: -Treatment options including alternatives, risks, complications were discussed -Nails sharply debrided 10 without complication/bleeding.  Discussed over-the-counter Fungi-Nail that comes in the pen version -We discussed treatment options of bunion with conservatively as well surgical appears not causing much discomfort at this point and it has not worsened.  At this point we will continue with offloading pads, shoe modifications before proceed with any surgical intervention but if needed we can always consider should he become symptomatic. -Discussed daily foot inspection. If there are any changes, to call the office immediately.  -Follow-up  in 3 months or sooner if any problems are to arise. In the meantime, encouraged to call the office with any questions, concerns, changes symptoms.  Ovid Curd, DPM

## 2021-03-28 NOTE — Care Management Obs Status (Signed)
MEDICARE OBSERVATION STATUS NOTIFICATION   Patient Details  Name: Isaac Stokes MRN: [id number] Date of Birth: 20-Oct-1950   Medicare Observation Status Notification Given:  Yes    Kermit Balo, RN 03/28/2021, 10:34 AM

## 2021-03-28 NOTE — Progress Notes (Addendum)
STROKE TEAM PROGRESS NOTE   INTERVAL HISTORY His CNA is at the bedside.  Pt has no further dizziness today. We reviewed Echo findings of +bubble study, PFO. LE [place] pending for further work up.   Vitals:   03/28/21 0408 03/28/21 0750 03/28/21 1215 03/28/21 1637  BP: 131/85 (!) 142/97 (!) 139/96 (!) 135/93  Pulse: 64 63 60 63  Resp: 18 17 18 18   Temp: 98.6 F (37 C) 98 F (36.7 C) 97.7 F (36.5 C) 98 F (36.7 C)  TempSrc: Oral Oral Oral Oral  SpO2: 99% 97% 97% 97%  Weight:      Height:       CBC:  Recent Labs  Lab 03/26/21 2337 03/27/21 0003 03/28/21 0136  WBC 9.8  --  7.3  NEUTROABS 7.6  --   --   HGB 11.9* 11.9* 10.6*  HCT 37.1* 35.0* 33.1*  MCV 95.9  --  94.6  PLT 196  --  182   Basic Metabolic Panel:  Recent Labs  Lab 03/26/21 2337 03/27/21 0003 03/28/21 0136  NA 138 139 137  K 4.4 4.3 4.6  CL 107 108 103  CO2 23  --  28  GLUCOSE 115* 112* 98  BUN 27* 29* 31*  CREATININE 1.64* 1.70* 1.81*  CALCIUM 8.8*  --  8.8*   Lipid Panel: 166 HgbA1c: 5.5 Urine Drug Screen:  Recent Labs  Lab 03/27/21 0114  LABOPIA NONE DETECTED  COCAINSCRNUR NONE DETECTED  LABBENZ NONE DETECTED  AMPHETMU NONE DETECTED  THCU NONE DETECTED  LABBARB NONE DETECTED    Alcohol Level  Recent Labs  Lab 03/26/21 2337  ETH <10    IMAGING past 24 hours MR ANGIO HEAD WO CONTRAST  Result Date: 03/27/2021 CLINICAL DATA:  Follow-up examination for acute stroke. EXAM: MRA HEAD WITHOUT CONTRAST TECHNIQUE: Angiographic images of the Circle of Willis were acquired using MRA technique without intravenous contrast. COMPARISON:  Comparison made with prior brain MRI from earlier the same day as well as previous MRA from 01/16/2021. FINDINGS: Anterior circulation: Distal cervical segments of the internal carotid arteries are widely patent with antegrade flow. Petrous, cavernous, and supraclinoid segments widely patent without stenosis. A1 segments patent bilaterally. Right A1 slightly hypoplastic.  Normal anterior communicating artery complex. Anterior cerebral arteries patent to their distal aspects without stenosis. No M1 stenosis or occlusion. Normal MCA bifurcations. Distal MCA branches remain perfused and symmetric. Distal small vessel atheromatous irregularity noted. Posterior circulation: Both V4 segments remain patent to the vertebrobasilar junction. Left vertebral artery dominant. Neither PICA origin visualized. Basilar patent to its distal aspect without stenosis. Superior cerebellar arteries patent bilaterally. Fetal type origin of the right PCA which remains widely patent to its distal aspect. Chronic left P2 occlusion again noted, stable. Anatomic variants: Fetal type origin of the right PCA.  No aneurysm. Other: None. IMPRESSION: 1. Stable intracranial MRA, with no new or progressive finding. 2. Chronic left P2 occlusion, stable. 3. Mild distal small vessel atheromatous irregularity. Electronically Signed   By: 01/18/2021 M.D.   On: 03/27/2021 23:28    PHYSICAL EXAM General: Appears well-developed; no acute distress. Psych: Affect appropriate to situation Eyes: No scleral injection HENT: No OP obstrucion Head: Normocephalic.  Cardiovascular: Normal rate and regular rhythm.  Respiratory: Effort normal and breath sounds normal to anterior ascultation GI: Soft.  No distension. There is no tenderness.  Skin: WDI    Neurological Examination Mental Status: Alert, oriented, thought content appropriate.  Speech fluent without evidence of aphasia. Able  to follow 3 step commands without difficulty. Cranial Nerves: II: Visual fields grossly normal,  III,IV, VI: ptosis not present, extra-ocular motions intact bilaterally, pupils equal, round, reactive to light and accommodation V,VII: smile symmetric, facial light touch sensation normal bilaterally VIII: hearing normal bilaterally IX,X: uvula rises symmetrically XI: bilateral shoulder shrug XII: midline tongue  extension Motor: Right : Upper extremity   5/5    Left:     Upper extremity   5/5  Lower extremity   5/5     Lower extremity   5/5 Tone and bulk:normal tone throughout; no atrophy noted Sensory: residual chronic left side numbness from prev stroke, unchanged per pt Deep Tendon Reflexes: 2+ and symmetric throughout Plantars: Right: downgoing   Left: downgoing Cerebellar: normal finger-to-nose, normal rapid alternating movements and normal heel-to-shin test Gait: did not test  ASSESSMENT/PLAN Isaac Stokes is a 70 y.o. 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  Stroke:  right distal tiny cortical infarct. Etiology likely embolic, secondary to PFO? LE [place] is pending to complete wk up. Will need out pt cardiology f/u for this.  CT head No acute abnormality. Small vessel disease. Atrophy. Old strokes in lt occipital, rt thalamus and posterior limb of Rt internal capsule MRI tiny cortical acute infarct in right temporal lobe. Old strokes as above with chronic small vessel disease.  MRA  stable since previous stroke wk up with no progression of the mild small vessel athero irregularity and chronic LP2 occlusion Carotid Doppler  no stenosis 2D Echo 60% EF, gr1 diastolic dysfunction, PFO with +bubble study. Recommend out pt f/u with structural heart team to consider closure.  LDL 166 HgbA1c 5.5 VTE prophylaxis - scds    Diet   Diet Heart Room service appropriate? Yes; Fluid consistency: Thin   aspirin 81 mg daily prior to admission, now on aspirin 81 mg daily and clopidogrel 75 mg daily for another 3 weeks, then monotherapy with Plavix Therapy recommendations:  home health Disposition:  pending  Hypertension Home meds:  none stable Permissive hypertension (OK if < 220/120) but gradually normalize in 5-7 days Long-term  BP goal normotensive  Hyperlipidemia Home meds:  lipitor 40mg , resumed in hospital LDL 166, goal < 70 Add lipitor 80mg   High intensity statin  Continue statin at discharge  Diabetes type II no dx HgbA1c 5.5, goal < 7.0 CBGs Recent Labs    03/26/21 2339  GLUCAP 109*    SSI  Other Stroke Risk Factors Advanced Age >/= 26  Cigarette smoker; advised to stop smoking Hx stroke/TIA Family hx stroke (mom, brother)  Other Active Problems CKD3b  Hospital day # 0 Out pt f/u with [facility] as well as structural heart team to consider PFO closure. If active DVT found on LE 2340, then anticoagulation alone instead of DAPT would be indicated. We will s/o. Please call back if needed.   Desiree Metzger-Cihelka, ARNP-C, ANVP-BC Pager: [phone]   ATTENDING ATTESTATION:  Dr. [place] evaluated pt independently, reviewed imaging, chart, labs. Discussed and formulated plan. Please see NP note above for details.  Leydi Winstead,MD   To contact Stroke Continuity provider, please refer to [phone]. After hours, contact General Neurology

## 2021-03-28 NOTE — Progress Notes (Signed)
Progress Note    GERRALD BASU  JKD:[id number] DOB: 1950-11-24  DOA: 03/26/2021 PCP: Pcp, No    Brief Narrative:     Medical records reviewed and are as summarized below:  VERLYN LAMBERT is an 70 y.o. male with medical history significant for TIA, HLD, tobacco use who presents with complaint of dizziness and generalized weakness.  He reports he had a stroke in May 2022 with residual numbness in his left leg that is unchanged.  He reports that tonight he went outside to smoke a cigarette and he had a sudden episode of dizziness.  Found to have PFO on his echo so LE duplexes are pending.  Assessment/Plan:   Principal Problem:   CVA (cerebral vascular accident) (HCC) Active Problems:   CKD (chronic kidney disease) stage 3, GFR 30-59 ml/min (HCC)    CVA: -resume his aspirin and Plavix for now, for another 3 weeks. Deranged renal function-do MRA of the head and carotid Dopplers. Repeat 2D echo with bubble-- PFO found so LE duplexes ordered High-dose statin No need to recheck A1c or lipids PT OT speech therapy- no follow up Permissive hypertension-allow for pressure to right high and treat only if systolics greater than 220 on a as needed basis. -neurology consult appreciated  CKD (chronic kidney disease) stage 3b, GFR 30-59 ml/min Stable kidney function.  Monitor electrolytes and renal function with labs in morning  Tobacco abuse -encourage cessation   Family Communication/Anticipated D/C date and plan/Code Status   DVT prophylaxis: Lovenox ordered. Code Status: Full Code.  Disposition Plan: Status is: Observation    Dispo: The patient is from: Home              Anticipated d/c is to: Home with home health              Patient currently is not medically stable to d/c.  Needs LE duplex prior to d/c   Difficult to place patient No         Medical Consultants:   Neurology  Subjective:   Says he tries not to inhale when he smokes  Objective:     Vitals:   03/27/21 2336 03/28/21 0408 03/28/21 0750 03/28/21 1215  BP: 129/79 131/85 (!) 142/97 (!) 139/96  Pulse: 64 64 63 60  Resp: 19 18 17 18   Temp: 98.1 F (36.7 C) 98.6 F (37 C) 98 F (36.7 C) 97.7 F (36.5 C)  TempSrc: Oral Oral Oral Oral  SpO2: 97% 99% 97% 97%  Weight:      Height:        Intake/Output Summary (Last 24 hours) at 03/28/2021 1420 Last data filed at 03/28/2021 1040 Gross per 24 hour  Intake 480 ml  Output 800 ml  Net -320 ml   Filed Weights   03/27/21 1041  Weight: 76.2 kg    Exam: In bed, NAD Rrr No increased work of breathing  Data Reviewed:   I have personally reviewed following labs and imaging studies:  Labs: Labs show the following:   Basic Metabolic Panel: Recent Labs  Lab 03/26/21 2337 03/27/21 0003 03/28/21 0136  NA 138 139 137  K 4.4 4.3 4.6  CL 107 108 103  CO2 23  --  28  GLUCOSE 115* 112* 98  BUN 27* 29* 31*  CREATININE 1.64* 1.70* 1.81*  CALCIUM 8.8*  --  8.8*   GFR Estimated Creatinine Clearance: 39.8 mL/min (A) (by C-G formula based on SCr of 1.81 mg/dL (H)). Liver Function  Tests: Recent Labs  Lab 03/26/21 2337  AST 18  ALT 10  ALKPHOS 44  BILITOT 0.7  PROT 6.2*  ALBUMIN 3.5   No results for input(s): LIPASE, AMYLASE in the last 168 hours. No results for input(s): AMMONIA in the last 168 hours. Coagulation profile Recent Labs  Lab 03/26/21 2337  INR 1.2    CBC: Recent Labs  Lab 03/26/21 2337 03/27/21 0003 03/28/21 0136  WBC 9.8  --  7.3  NEUTROABS 7.6  --   --   HGB 11.9* 11.9* 10.6*  HCT 37.1* 35.0* 33.1*  MCV 95.9  --  94.6  PLT 196  --  182   Cardiac Enzymes: No results for input(s): CKTOTAL, CKMB, CKMBINDEX, TROPONINI in the last 168 hours. BNP (last 3 results) No results for input(s): PROBNP in the last 8760 hours. CBG: Recent Labs  Lab 03/26/21 2339  GLUCAP 109*   D-Dimer: No results for input(s): DDIMER in the last 72 hours. Hgb A1c: No results for input(s): HGBA1C  in the last 72 hours. Lipid Profile: No results for input(s): CHOL, HDL, LDLCALC, TRIG, CHOLHDL, LDLDIRECT in the last 72 hours. Thyroid function studies: No results for input(s): TSH, T4TOTAL, T3FREE, THYROIDAB in the last 72 hours.  Invalid input(s): FREET3 Anemia work up: No results for input(s): VITAMINB12, FOLATE, FERRITIN, TIBC, IRON, RETICCTPCT in the last 72 hours. Sepsis Labs: Recent Labs  Lab 03/26/21 2337 03/28/21 0136  WBC 9.8 7.3    Microbiology Recent Results (from the past 240 hour(s))  Resp Panel by RT-PCR (Flu A&B, Covid) Nasopharyngeal Swab     Status: None   Collection Time: 03/26/21 11:28 PM   Specimen: Nasopharyngeal Swab; Nasopharyngeal(NP) swabs in vial transport medium  Result Value Ref Range Status   SARS Coronavirus 2 by RT PCR NEGATIVE NEGATIVE Final    Comment: (NOTE) SARS-CoV-2 target nucleic acids are NOT DETECTED.  The SARS-CoV-2 RNA is generally detectable in upper respiratory specimens during the acute phase of infection. The lowest concentration of SARS-CoV-2 viral copies this assay can detect is 138 copies/mL. A negative result does not preclude SARS-Cov-2 infection and should not be used as the sole basis for treatment or other patient management decisions. A negative result may occur with  improper specimen collection/handling, submission of specimen other than nasopharyngeal swab, presence of viral mutation(s) within the areas targeted by this assay, and inadequate number of viral copies(<138 copies/mL). A negative result must be combined with clinical observations, patient history, and epidemiological information. The expected result is Negative.  Fact Sheet for Patients:  BloggerCourse.com  Fact Sheet for Healthcare Providers:  SeriousBroker.it  This test is no t yet approved or cleared by the [place] FDA and  has been authorized for detection and/or diagnosis of SARS-CoV-2  by FDA under an Emergency Use Authorization (EUA). This EUA will remain  in effect (meaning this test can be used) for the duration of the COVID-19 declaration under Section 564(b)(1) of the Act, 21 U.S.C.section 360bbb-3(b)(1), unless the authorization is terminated  or revoked sooner.       Influenza A by PCR NEGATIVE NEGATIVE Final   Influenza B by PCR NEGATIVE NEGATIVE Final    Comment: (NOTE) The Xpert Xpress SARS-CoV-2/FLU/RSV plus assay is intended as an aid in the diagnosis of influenza from Nasopharyngeal swab specimens and should not be used as a sole basis for treatment. Nasal washings and aspirates are unacceptable for Xpert Xpress SARS-CoV-2/FLU/RSV testing.  Fact Sheet for Patients: BloggerCourse.com  Fact Sheet for Healthcare  Providers: SeriousBroker.it  This test is not yet approved or cleared by the [place] and has been authorized for detection and/or diagnosis of SARS-CoV-2 by FDA under an Emergency Use Authorization (EUA). This EUA will remain in effect (meaning this test can be used) for the duration of the COVID-19 declaration under Section 564(b)(1) of the Act, 21 U.S.C. section 360bbb-3(b)(1), unless the authorization is terminated or revoked.  Performed at [facility], [address] [place], [place] [address]     Procedures and diagnostic studies:  CT HEAD WO CONTRAST  Result Date: 03/26/2021 CLINICAL DATA:  Stroke, follow up Dizziness tonight.  Episode of vomiting. EXAM: CT HEAD WITHOUT CONTRAST TECHNIQUE: Contiguous axial images were obtained from the base of the skull through the vertex without intravenous contrast. COMPARISON:  Head CT and brain MRI 01/26/2021 FINDINGS: Brain: No hemorrhage or evidence of acute ischemia. Remote left occipital infarct is unchanged. Remote right thalamic infarct is unchanged. Small remote lacunar infarct in the posterior limb of the right internal  capsule. Chronic mild ex vacuo dilatation of the right lateral ventricle. No hydrocephalus. Chronic periventricular chronic small vessel ischemia. No subdural or extra-axial collection. Vascular: No hyperdense vessel. Skull: No fracture or focal lesion. Sinuses/Orbits: Paranasal sinuses and mastoid air cells are clear. The visualized orbits are unremarkable. Other: None. IMPRESSION: 1. No acute intracranial abnormality. 2. Remote infarcts in the left occipital lobe, right thalamus, and posterior limb of the right internal capsule. Chronic small vessel ischemia. Electronically Signed   By: Narda Rutherford M.D.   On: 03/26/2021 23:58   MR ANGIO HEAD WO CONTRAST  Result Date: 03/27/2021 CLINICAL DATA:  Follow-up examination for acute stroke. EXAM: MRA HEAD WITHOUT CONTRAST TECHNIQUE: Angiographic images of the Circle of Willis were acquired using MRA technique without intravenous contrast. COMPARISON:  Comparison made with prior brain MRI from earlier the same day as well as previous MRA from 01/16/2021. FINDINGS: Anterior circulation: Distal cervical segments of the internal carotid arteries are widely patent with antegrade flow. Petrous, cavernous, and supraclinoid segments widely patent without stenosis. A1 segments patent bilaterally. Right A1 slightly hypoplastic. Normal anterior communicating artery complex. Anterior cerebral arteries patent to their distal aspects without stenosis. No M1 stenosis or occlusion. Normal MCA bifurcations. Distal MCA branches remain perfused and symmetric. Distal small vessel atheromatous irregularity noted. Posterior circulation: Both V4 segments remain patent to the vertebrobasilar junction. Left vertebral artery dominant. Neither PICA origin visualized. Basilar patent to its distal aspect without stenosis. Superior cerebellar arteries patent bilaterally. Fetal type origin of the right PCA which remains widely patent to its distal aspect. Chronic left P2 occlusion again noted,  stable. Anatomic variants: Fetal type origin of the right PCA.  No aneurysm. Other: None. IMPRESSION: 1. Stable intracranial MRA, with no new or progressive finding. 2. Chronic left P2 occlusion, stable. 3. Mild distal small vessel atheromatous irregularity. Electronically Signed   By: Rise Mu M.D.   On: 03/27/2021 23:28   MR BRAIN WO CONTRAST  Result Date: 03/27/2021 CLINICAL DATA:  Initial evaluation for neuro deficit, stroke suspected. EXAM: MRI HEAD WITHOUT CONTRAST TECHNIQUE: Multiplanar, multiecho pulse sequences of the brain and surrounding structures were obtained without intravenous contrast. COMPARISON:  CT from 03/26/2021 and MRI from 01/26/2021. FINDINGS: Brain: Generalized age-related cerebral atrophy with moderate chronic microvascular ischemic disease. Interval evolution of previously identified right thalamic infarct, now chronic in appearance. Additional chronic right basal ganglia infarct with associated chronic hemosiderin staining. Wallerian degeneration at the right midbrain. Encephalomalacia and  gliosis involving the left occipital lobe consistent with a chronic left PCA distribution infarct. Few tiny remote lacunar infarcts noted about the left thalamus as well. Punctate 3 mm focus of restricted diffusion seen involving the mid right temporal lobe, consistent with a tiny acute ischemic infarct (series 3, image 22). No associated hemorrhage or mass effect. Additional possible tiny acute ischemic infarct at the posterior right frontal parietal region as well (series 3, image 35). No associated hemorrhage. No other diffusion abnormality to suggest acute or subacute ischemia. No acute intracranial hemorrhage. Few scattered chronic micro hemorrhages noted clustered about the anterior right temporal pole, stable. No mass lesion, midline shift or mass effect. Mild ex vacuo dilatation of the right lateral ventricle without hydrocephalus. No extra-axial fluid collection. Pituitary  gland and suprasellar region within normal limits. Subcentimeter benign appearing pineal cyst noted of doubtful significance. Vascular: Major intracranial vascular flow voids are maintained. Skull and upper cervical spine: Craniocervical junction within normal limits. Bone marrow signal intensity normal. No scalp soft tissue abnormality. Sinuses/Orbits: Globes and orbital soft tissues demonstrate no acute finding. Paranasal sinuses are largely clear. Trace bilateral mastoid effusions noted, of doubtful significance. Visualized nasopharynx unremarkable. Inner ear structures grossly within normal limits. Other: None. IMPRESSION: 1. 3 mm acute ischemic nonhemorrhagic right temporal lobe infarct. 2. Additional possible tiny acute ischemic nonhemorrhagic posterior right frontoparietal infarct as above. 3. Underlying age-related cerebral atrophy with moderate chronic microvascular ischemic disease, with additional chronic ischemic infarcts as above. Electronically Signed   By: Rise Mu M.D.   On: 03/27/2021 03:22   ECHOCARDIOGRAM LIMITED BUBBLE STUDY  Result Date: 03/27/2021    ECHOCARDIOGRAM LIMITED REPORT   Patient Name:   ZACHERIE HONEYMAN Date of Exam: 03/27/2021 Medical Rec #:  [id number]        Height:       72.0 in Accession #:    [id number]       Weight:       168.0 lb Date of Birth:  1951-02-03        BSA:          1.978 m Patient Age:    70 years         BP:           133/78 mmHg Patient Gender: M                HR:           54 bpm. Exam Location:  Inpatient Procedure: 2D Echo Indications:    stroke  History:        Patient has prior history of Echocardiogram examinations, most                 recent 01/27/2021. Chronic kidney disease.  Sonographer:    Delcie Roch Referring Phys: [id number] BRADLEY S CHOTINER IMPRESSIONS  1. Left ventricular ejection fraction, by estimation, is 55 to 60%. The left ventricle has normal function. The left ventricle has no regional wall motion abnormalities. Left  ventricular diastolic parameters are consistent with Grade I diastolic dysfunction (impaired relaxation).  2. Right ventricular systolic function is normal. The right ventricular size is normal.  3. Trivial mitral valve regurgitation. No evidence of mitral stenosis.  4. The aortic valve is tricuspid. Aortic valve regurgitation is mild.  5. Agitated saline contrast bubble study was positive with shunting observed within 3-6 cardiac cycles suggestive of interatrial shunt. Small shunt with minimal bubbles ( < 10) during initial cardiac cycles. Comparison(s): A prior study was  performed on 01/27/2021. No significant change from prior study. FINDINGS  Left Ventricle: Left ventricular ejection fraction, by estimation, is 55 to 60%. The left ventricle has normal function. The left ventricle has no regional wall motion abnormalities. The left ventricular internal cavity size was normal in size. There is  no left ventricular hypertrophy. Left ventricular diastolic parameters are consistent with Grade I diastolic dysfunction (impaired relaxation). Right Ventricle: The right ventricular size is normal. Right ventricular systolic function is normal. Mitral Valve: Trivial mitral valve regurgitation. No evidence of mitral valve stenosis. Tricuspid Valve: The tricuspid valve is normal in structure. Tricuspid valve regurgitation is trivial. No evidence of tricuspid stenosis. Aortic Valve: The aortic valve is tricuspid. Aortic valve regurgitation is mild. Aortic regurgitation PHT measures 684 msec. Pulmonic Valve: The pulmonic valve was grossly normal. Pulmonic valve regurgitation is not visualized. No evidence of pulmonic stenosis. Aorta: The aortic root and ascending aorta are structurally normal, with no evidence of dilitation. IAS/Shunts: Agitated saline contrast was given intravenously to evaluate for intracardiac shunting. Agitated saline contrast bubble study was positive with shunting observed within 3-6 cardiac cycles  suggestive of interatrial shunt. LEFT VENTRICLE PLAX 2D LVIDd:         4.70 cm Diastology LVIDs:         2.70 cm LV e' medial:    6.74 cm/s LV PW:         1.00 cm LV E/e' medial:  10.4 LV IVS:        1.00 cm LV e' lateral:   8.05 cm/s                        LV E/e' lateral: 8.7  LEFT ATRIUM         Index LA diam:    3.30 cm 1.67 cm/m  AORTIC VALVE LVOT Vmax:   87.00 cm/s LVOT Vmean:  58.700 cm/s LVOT VTI:    0.227 m AI PHT:      684 msec  AORTA Ao Asc diam: 3.40 cm MITRAL VALVE               TRICUSPID VALVE MV Area (PHT): 2.48 cm    TR Peak grad:   20.8 mmHg MV Decel Time: 306 msec    TR Vmax:        228.00 cm/s MV E velocity: 70.30 cm/s MV A velocity: 78.00 cm/s  SHUNTS MV E/A ratio:  0.90        Systemic VTI: 0.23 m Riley LamMahesh Chandrasekhar MD Electronically signed by Riley LamMahesh Chandrasekhar MD Signature Date/Time: 03/27/2021/1:52:19 PM    Final     Medications:    aspirin  81 mg Oral Daily   atorvastatin  80 mg Oral Daily   clopidogrel  75 mg Oral Daily   Continuous Infusions:   LOS: 0 days   Joseph ArtJessica U Tanveer Brammer  [facility]   How to contact the [facility] Attending or Consulting provider 7A - 7P or covering provider during after hours 7P -7A, for this patient?  Check the care team in [facility] and look for a) attending/consulting [facility] provider listed and b) the [facility] team listed Log into [email or web address] and use [place]'s universal password to access. If you do not have the password, please contact the hospital operator. Locate the [facility] provider you are looking for under [facility] and page to a number that you can be directly reached. If you still have difficulty reaching the provider, please page the [facility] (Director on  Call) for the Hospitalists listed on amion for assistance.  03/28/2021, 2:20 PM

## 2021-03-29 ENCOUNTER — Other Ambulatory Visit (HOSPITAL_COMMUNITY): Payer: Self-pay

## 2021-03-29 DIAGNOSIS — I639 Cerebral infarction, unspecified: Secondary | ICD-10-CM | POA: Diagnosis not present

## 2021-03-29 DIAGNOSIS — Q211 Atrial septal defect: Secondary | ICD-10-CM | POA: Diagnosis not present

## 2021-03-29 DIAGNOSIS — N1832 Chronic kidney disease, stage 3b: Secondary | ICD-10-CM | POA: Diagnosis not present

## 2021-03-29 MED ORDER — ATORVASTATIN CALCIUM 80 MG PO TABS
80.0000 mg | ORAL_TABLET | Freq: Every day | ORAL | 0 refills | Status: DC
  Filled 2021-03-29: qty 30, 30d supply, fill #0

## 2021-03-29 MED ORDER — CLOPIDOGREL BISULFATE 75 MG PO TABS
75.0000 mg | ORAL_TABLET | Freq: Every day | ORAL | 1 refills | Status: DC
  Filled 2021-03-29: qty 30, 30d supply, fill #0

## 2021-03-29 NOTE — Progress Notes (Signed)
Occupational Therapy Treatment Note  Further assessed cognition with use of the Medi-Cog. Pt scored a 6/10, indicating need for S with medication management. After further discussion, pt has a history of cognitive deficits and states he was in special education classes from 6th grade until he graduated. Discussed need for Nsg to go over Medication management in a simplified manner. Recommend follow up with HHOT to maximize independence with IADL tasks and facilitate return to prior level of function. Discussed updated recommendation with CM.    03/29/21 1027  OT Visit Information  Last OT Received On 03/29/21  Assistance Needed +1  History of Present Illness 70 y/o male presented to ED on 7/29 with suddden onset of dizziness. MRI revealed punctate infarction in R temporal lobe as well as punctate infarct in posterior R frontoparietal lobe. 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  Precautions  Precautions Fall  Pain Assessment  Pain Assessment No/denies pain  Cognition  Arousal/Alertness Awake/alert  Behavior During Therapy WFL for tasks assessed/performed  Overall Cognitive Status No family/caregiver present to determine baseline cognitive functioning  General Comments Pt with apparent cognitive deficits at baseline; Was in special education classes throughouth middle/high school  ADL  General ADL Comments able to complete basic ADL tasks with S  Bed Mobility  Overal bed mobility Modified Independent  Balance  Sitting balance-Leahy Scale Good  Standing balance-Leahy Scale Fair  Vision- Assessment  Additional Comments wears reading glasses  Transfers  Overall transfer level Needs assistance  Sit to Stand Supervision  OT - End of Session  Activity Tolerance Patient tolerated treatment well  Patient left in bed;with call bell/phone within reach;with bed alarm set  Nurse Communication Other (comment) (DC needs)  OT Assessment/Plan  OT Plan  Discharge plan needs to be updated  OT Visit Diagnosis Unsteadiness on feet (R26.81);Other symptoms and signs involving cognitive function  OT Frequency (ACUTE ONLY) Min 2X/week  Follow Up Recommendations Home health OT;Supervision - Intermittent [facility] [place])  OT Equipment None recommended by OT  AM-PAC OT "6 Clicks" Daily Activity Outcome Measure (Version 2)  Help from another person eating meals? 4  Help from another person taking care of personal grooming? 4  Help from another person toileting, which includes using toliet, bedpan, or urinal? 3  Help from another person bathing (including washing, rinsing, drying)? 3  Help from another person to put on and taking off regular upper body clothing? 4  Help from another person to put on and taking off regular lower body clothing? 3  6 Click Score 21  Progressive Mobility  What is the highest level of mobility based on the progressive mobility assessment? Level 5 (Walks with assist in room/hall) - Balance while stepping forward/back and can walk in room with assist - Complete  Mobility Out of bed to chair with meals;Ambulated with assistance in hallway  OT Goal Progression  Progress towards OT goals Progressing toward goals  Acute Rehab OT Goals  Patient Stated Goal To not have anymore strokes  OT Goal Formulation With patient  Time For Goal Achievement 04/10/21  Potential to Achieve Goals Good  ADL Goals  Pt Will Perform Lower Body Bathing with modified independence;sit to/from stand  Pt Will Perform Lower Body Dressing with modified independence;sit to/from stand  Pt Will Transfer to Toilet Independently;ambulating;regular height toilet  Additional ADL Goal #1 Demonstrate ability to independently manage medicaitons on the Pill Box Test  OT Time Calculation  OT Start Time (ACUTE ONLY) 1010  OT Stop Time (ACUTE ONLY) 1030  OT Time Calculation (min) 20 min  OT General Charges  $OT Visit 1 Visit  OT Treatments  $Therapeutic Activity 8-22  mins  Luisa Dago, OT/L   Acute OT Clinical Specialist Acute Rehabilitation Services Pager [phone] Office [phone]

## 2021-03-29 NOTE — Discharge Summary (Signed)
Physician Discharge Summary  Isaac Stokes:[id number] DOB: 04-10-1951 DOA: 03/26/2021  PCP: Ellyn Hack, MD  Admit date: 03/26/2021 Discharge date: 03/29/2021  Admitted From: home Discharge disposition: home   Recommendations for Outpatient Follow-Up:   Home health Neurology follow up Referral to structural cards for PFO follow up aspirin 81 mg daily and clopidogrel 75 mg daily for another 3 weeks, then monotherapy with Plavix   Discharge Diagnosis:   Principal Problem:   CVA (cerebral vascular accident) (HCC) Active Problems:   CKD (chronic kidney disease) stage 3, GFR 30-59 ml/min (HCC)    Discharge Condition: Improved.  Diet recommendation: Low sodium, heart healthy  Wound care: None.  Code status: Full.   History of Present Illness:   Isaac Stokes is a 70 y.o. male with medical history significant for TIA, HLD, tobacco use who presents with complaint of dizziness and generalized weakness.  He reports he had a stroke in May 2022 with residual numbness in his left leg that is unchanged.  He reports that tonight he went outside to smoke a cigarette and he had a sudden episode of dizziness.  He states he felt like there then was spinning around him and he became a little nauseous and weak and had to sit down.  Reports symptoms started around 9 PM on March 26, 2021.  He did not have any loss of consciousness and no seizure activity.  He denies any chest pain or palpitations.  He did not have any vomiting, abdominal pain, fever, chills or urinary symptoms.  He reports that he felt like his speech was thick for a few minutes when the dizziness first started but has improved and is back to its baseline.  After his stroke in May he was on dual antiplatelet therapy for 3 weeks and now he is only taking aspirin daily. He continues to smoke cigarettes but he is trying to quit and he is down to 6 cigarettes a day.  He used to smoke 2-1/4 pack a day.  Denies illicit  drug use or alcohol use.   Hospital Course by Problem:     CVA: -aspirin 81 mg daily and clopidogrel 75 mg daily for another 3 weeks, then monotherapy with Plavix- Repeat 2D echo with bubble-- PFO found so LE duplexes ordered- negative- so will need outpatient cardiology followup High-dose statin No need to recheck A1c or lipids -neurology consult appreciated   CKD (chronic kidney disease) stage 3b, GFR 30-59 ml/min -outpatient follow up   Tobacco abuse -encourage cessation    Medical Consultants:    Neurology   Discharge Exam:   Vitals:   03/29/21 0734 03/29/21 1151  BP: 112/81 (!) 141/89  Pulse: (!) 57 71  Resp: 16 19  Temp: (!) 97.5 F (36.4 C) (!) 97.5 F (36.4 C)  SpO2: 97% 99%   Vitals:   03/28/21 2336 03/29/21 0358 03/29/21 0734 03/29/21 1151  BP: 129/86 130/83 112/81 (!) 141/89  Pulse: 63 (!) 58 (!) 57 71  Resp: Temp: 97.6 F (36.4 C) 98.3 F (36.8 C) (!) 97.5 F (36.4 C) (!) 97.5 F (36.4 C)  TempSrc: Oral Oral Oral Oral  SpO2: 100% 98% 97% 99%  Weight:      Height:        General exam: Appears calm and comfortable.    The results of significant diagnostics from this hospitalization (including imaging, microbiology, ancillary and laboratory) are listed below for reference.  Procedures and Diagnostic Studies:   CT HEAD WO CONTRAST  Result Date: 03/26/2021 CLINICAL DATA:  Stroke, follow up Dizziness tonight.  Episode of vomiting. EXAM: CT HEAD WITHOUT CONTRAST TECHNIQUE: Contiguous axial images were obtained from the base of the skull through the vertex without intravenous contrast. COMPARISON:  Head CT and brain MRI 01/26/2021 FINDINGS: Brain: No hemorrhage or evidence of acute ischemia. Remote left occipital infarct is unchanged. Remote right thalamic infarct is unchanged. Small remote lacunar infarct in the posterior limb of the right internal capsule. Chronic mild ex vacuo dilatation of the right lateral ventricle. No  hydrocephalus. Chronic periventricular chronic small vessel ischemia. No subdural or extra-axial collection. Vascular: No hyperdense vessel. Skull: No fracture or focal lesion. Sinuses/Orbits: Paranasal sinuses and mastoid air cells are clear. The visualized orbits are unremarkable. Other: None. IMPRESSION: 1. No acute intracranial abnormality. 2. Remote infarcts in the left occipital lobe, right thalamus, and posterior limb of the right internal capsule. Chronic small vessel ischemia. Electronically Signed   By: Narda Rutherford M.D.   On: 03/26/2021 23:58   MR ANGIO HEAD WO CONTRAST  Result Date: 03/27/2021 CLINICAL DATA:  Follow-up examination for acute stroke. EXAM: MRA HEAD WITHOUT CONTRAST TECHNIQUE: Angiographic images of the Circle of Willis were acquired using MRA technique without intravenous contrast. COMPARISON:  Comparison made with prior brain MRI from earlier the same day as well as previous MRA from 01/16/2021. FINDINGS: Anterior circulation: Distal cervical segments of the internal carotid arteries are widely patent with antegrade flow. Petrous, cavernous, and supraclinoid segments widely patent without stenosis. A1 segments patent bilaterally. Right A1 slightly hypoplastic. Normal anterior communicating artery complex. Anterior cerebral arteries patent to their distal aspects without stenosis. No M1 stenosis or occlusion. Normal MCA bifurcations. Distal MCA branches remain perfused and symmetric. Distal small vessel atheromatous irregularity noted. Posterior circulation: Both V4 segments remain patent to the vertebrobasilar junction. Left vertebral artery dominant. Neither PICA origin visualized. Basilar patent to its distal aspect without stenosis. Superior cerebellar arteries patent bilaterally. Fetal type origin of the right PCA which remains widely patent to its distal aspect. Chronic left P2 occlusion again noted, stable. Anatomic variants: Fetal type origin of the right PCA.  No aneurysm.  Other: None. IMPRESSION: 1. Stable intracranial MRA, with no new or progressive finding. 2. Chronic left P2 occlusion, stable. 3. Mild distal small vessel atheromatous irregularity. Electronically Signed   By: Rise Mu M.D.   On: 03/27/2021 23:28   MR BRAIN WO CONTRAST  Result Date: 03/27/2021 CLINICAL DATA:  Initial evaluation for neuro deficit, stroke suspected. EXAM: MRI HEAD WITHOUT CONTRAST TECHNIQUE: Multiplanar, multiecho pulse sequences of the brain and surrounding structures were obtained without intravenous contrast. COMPARISON:  CT from 03/26/2021 and MRI from 01/26/2021. FINDINGS: Brain: Generalized age-related cerebral atrophy with moderate chronic microvascular ischemic disease. Interval evolution of previously identified right thalamic infarct, now chronic in appearance. Additional chronic right basal ganglia infarct with associated chronic hemosiderin staining. Wallerian degeneration at the right midbrain. Encephalomalacia and gliosis involving the left occipital lobe consistent with a chronic left PCA distribution infarct. Few tiny remote lacunar infarcts noted about the left thalamus as well. Punctate 3 mm focus of restricted diffusion seen involving the mid right temporal lobe, consistent with a tiny acute ischemic infarct (series 3, image 22). No associated hemorrhage or mass effect. Additional possible tiny acute ischemic infarct at the posterior right frontal parietal region as well (series 3, image 35). No associated hemorrhage. No other diffusion abnormality to suggest acute or  subacute ischemia. No acute intracranial hemorrhage. Few scattered chronic micro hemorrhages noted clustered about the anterior right temporal pole, stable. No mass lesion, midline shift or mass effect. Mild ex vacuo dilatation of the right lateral ventricle without hydrocephalus. No extra-axial fluid collection. Pituitary gland and suprasellar region within normal limits. Subcentimeter benign appearing  pineal cyst noted of doubtful significance. Vascular: Major intracranial vascular flow voids are maintained. Skull and upper cervical spine: Craniocervical junction within normal limits. Bone marrow signal intensity normal. No scalp soft tissue abnormality. Sinuses/Orbits: Globes and orbital soft tissues demonstrate no acute finding. Paranasal sinuses are largely clear. Trace bilateral mastoid effusions noted, of doubtful significance. Visualized nasopharynx unremarkable. Inner ear structures grossly within normal limits. Other: None. IMPRESSION: 1. 3 mm acute ischemic nonhemorrhagic right temporal lobe infarct. 2. Additional possible tiny acute ischemic nonhemorrhagic posterior right frontoparietal infarct as above. 3. Underlying age-related cerebral atrophy with moderate chronic microvascular ischemic disease, with additional chronic ischemic infarcts as above. Electronically Signed   By: Rise Mu M.D.   On: 03/27/2021 03:22   ECHOCARDIOGRAM LIMITED BUBBLE STUDY  Result Date: 03/27/2021    ECHOCARDIOGRAM LIMITED REPORT   Patient Name:   GARETT TETZLOFF Date of Exam: 03/27/2021 Medical Rec #:  [id number]        Height:       72.0 in Accession #:    [id number]       Weight:       168.0 lb Date of Birth:  08-11-1951        BSA:          1.978 m Patient Age:    70 years         BP:           133/78 mmHg Patient Gender: M                HR:           54 bpm. Exam Location:  Inpatient Procedure: 2D Echo Indications:    stroke  History:        Patient has prior history of Echocardiogram examinations, most                 recent 01/27/2021. Chronic kidney disease.  Sonographer:    Delcie Roch Referring Phys: [id number] BRADLEY S CHOTINER IMPRESSIONS  1. Left ventricular ejection fraction, by estimation, is 55 to 60%. The left ventricle has normal function. The left ventricle has no regional wall motion abnormalities. Left ventricular diastolic parameters are consistent with Grade I diastolic dysfunction  (impaired relaxation).  2. Right ventricular systolic function is normal. The right ventricular size is normal.  3. Trivial mitral valve regurgitation. No evidence of mitral stenosis.  4. The aortic valve is tricuspid. Aortic valve regurgitation is mild.  5. Agitated saline contrast bubble study was positive with shunting observed within 3-6 cardiac cycles suggestive of interatrial shunt. Small shunt with minimal bubbles ( < 10) during initial cardiac cycles. Comparison(s): A prior study was performed on 01/27/2021. No significant change from prior study. FINDINGS  Left Ventricle: Left ventricular ejection fraction, by estimation, is 55 to 60%. The left ventricle has normal function. The left ventricle has no regional wall motion abnormalities. The left ventricular internal cavity size was normal in size. There is  no left ventricular hypertrophy. Left ventricular diastolic parameters are consistent with Grade I diastolic dysfunction (impaired relaxation). Right Ventricle: The right ventricular size is normal. Right ventricular systolic function is normal. Mitral Valve: Trivial mitral valve  regurgitation. No evidence of mitral valve stenosis. Tricuspid Valve: The tricuspid valve is normal in structure. Tricuspid valve regurgitation is trivial. No evidence of tricuspid stenosis. Aortic Valve: The aortic valve is tricuspid. Aortic valve regurgitation is mild. Aortic regurgitation PHT measures 684 msec. Pulmonic Valve: The pulmonic valve was grossly normal. Pulmonic valve regurgitation is not visualized. No evidence of pulmonic stenosis. Aorta: The aortic root and ascending aorta are structurally normal, with no evidence of dilitation. IAS/Shunts: Agitated saline contrast was given intravenously to evaluate for intracardiac shunting. Agitated saline contrast bubble study was positive with shunting observed within 3-6 cardiac cycles suggestive of interatrial shunt. LEFT VENTRICLE PLAX 2D LVIDd:         4.70 cm Diastology  LVIDs:         2.70 cm LV e' medial:    6.74 cm/s LV PW:         1.00 cm LV E/e' medial:  10.4 LV IVS:        1.00 cm LV e' lateral:   8.05 cm/s                        LV E/e' lateral: 8.7  LEFT ATRIUM         Index LA diam:    3.30 cm 1.67 cm/m  AORTIC VALVE LVOT Vmax:   87.00 cm/s LVOT Vmean:  58.700 cm/s LVOT VTI:    0.227 m AI PHT:      684 msec  AORTA Ao Asc diam: 3.40 cm MITRAL VALVE               TRICUSPID VALVE MV Area (PHT): 2.48 cm    TR Peak grad:   20.8 mmHg MV Decel Time: 306 msec    TR Vmax:        228.00 cm/s MV E velocity: 70.30 cm/s MV A velocity: 78.00 cm/s  SHUNTS MV E/A ratio:  0.90        Systemic VTI: 0.23 m Riley LamMahesh Chandrasekhar MD Electronically signed by Riley LamMahesh Chandrasekhar MD Signature Date/Time: 03/27/2021/1:52:19 PM    Final      Labs:   Basic Metabolic Panel: Recent Labs  Lab 03/26/21 2337 03/27/21 0003 03/28/21 0136  NA 138 139 137  K 4.4 4.3 4.6  CL 107 108 103  CO2 23  --  28  GLUCOSE 115* 112* 98  BUN 27* 29* 31*  CREATININE 1.64* 1.70* 1.81*  CALCIUM 8.8*  --  8.8*   GFR Estimated Creatinine Clearance: 39.8 mL/min (A) (by C-G formula based on SCr of 1.81 mg/dL (H)). Liver Function Tests: Recent Labs  Lab 03/26/21 2337  AST 18  ALT 10  ALKPHOS 44  BILITOT 0.7  PROT 6.2*  ALBUMIN 3.5   No results for input(s): LIPASE, AMYLASE in the last 168 hours. No results for input(s): AMMONIA in the last 168 hours. Coagulation profile Recent Labs  Lab 03/26/21 2337  INR 1.2    CBC: Recent Labs  Lab 03/26/21 2337 03/27/21 0003 03/28/21 0136  WBC 9.8  --  7.3  NEUTROABS 7.6  --   --   HGB 11.9* 11.9* 10.6*  HCT 37.1* 35.0* 33.1*  MCV 95.9  --  94.6  PLT 196  --  182   Cardiac Enzymes: No results for input(s): CKTOTAL, CKMB, CKMBINDEX, TROPONINI in the last 168 hours. BNP: Invalid input(s): POCBNP CBG: Recent Labs  Lab 03/26/21 2339  GLUCAP 109*   D-Dimer No results for input(s): DDIMER in  the last 72 hours. Hgb A1c No results for  input(s): HGBA1C in the last 72 hours. Lipid Profile No results for input(s): CHOL, HDL, LDLCALC, TRIG, CHOLHDL, LDLDIRECT in the last 72 hours. Thyroid function studies No results for input(s): TSH, T4TOTAL, T3FREE, THYROIDAB in the last 72 hours.  Invalid input(s): FREET3 Anemia work up No results for input(s): VITAMINB12, FOLATE, FERRITIN, TIBC, IRON, RETICCTPCT in the last 72 hours. Microbiology Recent Results (from the past 240 hour(s))  Resp Panel by RT-PCR (Flu A&B, Covid) Nasopharyngeal Swab     Status: None   Collection Time: 03/26/21 11:28 PM   Specimen: Nasopharyngeal Swab; Nasopharyngeal(NP) swabs in vial transport medium  Result Value Ref Range Status   SARS Coronavirus 2 by RT PCR NEGATIVE NEGATIVE Final    Comment: (NOTE) SARS-CoV-2 target nucleic acids are NOT DETECTED.  The SARS-CoV-2 RNA is generally detectable in upper respiratory specimens during the acute phase of infection. The lowest concentration of SARS-CoV-2 viral copies this assay can detect is 138 copies/mL. A negative result does not preclude SARS-Cov-2 infection and should not be used as the sole basis for treatment or other patient management decisions. A negative result may occur with  improper specimen collection/handling, submission of specimen other than nasopharyngeal swab, presence of viral mutation(s) within the areas targeted by this assay, and inadequate number of viral copies(<138 copies/mL). A negative result must be combined with clinical observations, patient history, and epidemiological information. The expected result is Negative.  Fact Sheet for Patients:  BloggerCourse.com  Fact Sheet for Healthcare Providers:  SeriousBroker.it  This test is no t yet approved or cleared by the [place] FDA and  has been authorized for detection and/or diagnosis of SARS-CoV-2 by FDA under an Emergency Use Authorization (EUA). This EUA will  remain  in effect (meaning this test can be used) for the duration of the COVID-19 declaration under Section 564(b)(1) of the Act, 21 U.S.C.section 360bbb-3(b)(1), unless the authorization is terminated  or revoked sooner.       Influenza A by PCR NEGATIVE NEGATIVE Final   Influenza B by PCR NEGATIVE NEGATIVE Final    Comment: (NOTE) The Xpert Xpress SARS-CoV-2/FLU/RSV plus assay is intended as an aid in the diagnosis of influenza from Nasopharyngeal swab specimens and should not be used as a sole basis for treatment. Nasal washings and aspirates are unacceptable for Xpert Xpress SARS-CoV-2/FLU/RSV testing.  Fact Sheet for Patients: BloggerCourse.com  Fact Sheet for Healthcare Providers: SeriousBroker.it  This test is not yet approved or cleared by the [place] FDA and has been authorized for detection and/or diagnosis of SARS-CoV-2 by FDA under an Emergency Use Authorization (EUA). This EUA will remain in effect (meaning this test can be used) for the duration of the COVID-19 declaration under Section 564(b)(1) of the Act, 21 U.S.C. section 360bbb-3(b)(1), unless the authorization is terminated or revoked.  Performed at [facility], [address] [place], [place] [address]      Discharge Instructions:   Discharge Instructions     Ambulatory referral to Cardiology   Complete by: As directed    PFO found during CVA work up   Ambulatory referral to Neurology   Complete by: As directed    An appointment is requested in approximately: 4 weeks   Diet - low sodium heart healthy   Complete by: As directed    Discharge instructions   Complete by: As directed    aspirin 81 mg daily and clopidogrel 75 mg daily for 3  weeks, then monotherapy with Plavix   Increase activity slowly   Complete by: As directed       Allergies as of 03/29/2021   No Known Allergies      Medication List     STOP taking these  medications    nicotine polacrilex 2 MG gum Commonly known as: NICORETTE       TAKE these medications    aspirin 81 MG chewable tablet Chew 1 tablet (81 mg total) by mouth daily.   atorvastatin 80 MG tablet Commonly known as: LIPITOR Take 1 tablet (80 mg total) by mouth daily. Start taking on: March 30, 2021 What changed:  medication strength how much to take when to take this   ciclopirox 8 % solution Commonly known as: PENLAC Apply 1 application topically at bedtime. Apply over nail and surrounding skin. Apply daily over previous coat. After seven (7) days, may remove with alcohol and continue cycle.   clopidogrel 75 MG tablet Commonly known as: PLAVIX Take 1 tablet (75 mg total) by mouth daily. Start taking on: March 30, 2021        Follow-up Information     [facility] Follow up.   Specialty: Home Health Services Why: The home health agency will contact you for the first home visit Contact information: [address] [place] [address] [phone]                  Time coordinating discharge: 25 min  Signed:  Joseph Art DO  [facility] 03/29/2021, 3:18 PM

## 2021-03-29 NOTE — TOC Transition Note (Signed)
Transition of Care [facility]) - CM/SW Discharge Note   Patient Details  Name: Isaac Stokes MRN: [id number] Date of Birth: April 06, 1951  [facility] CM/SW Contact:  Kermit Balo, RN Phone Number: 03/29/2021, 10:25 AM   Clinical Narrative:    Patient is discharging home with [facility] services through [facility]. CM has asked HH agency to add RN to [facility] if needed after seen by therapy at home for potential med management needs.  Pt d/c meds to be delivered to the room per [facility] pharmacy.  Pt has transport home.  Final next level of care: Home w Home Health Services Barriers to Discharge: No Barriers Identified   Patient Goals and CMS Choice   CMS Medicare.gov Compare Post Acute Care list provided to:: Patient Choice offered to / list presented to : Patient  Discharge Placement                       Discharge Plan and Services   Discharge Planning Services: CM Consult Post Acute Care Choice: Home Health                    HH Arranged: PT, OT [facility] Agency: [facility] Date [facility] Agency Contacted: 03/29/21   Representative spoke with at [facility] Agency: Amy  Social Determinants of Health (SDOH) Interventions     Readmission Risk Interventions No flowsheet data found.

## 2021-03-29 NOTE — Progress Notes (Signed)
Physical Therapy Treatment Patient Details Name: Isaac Stokes MRN: [id number] DOB: [birth year] Today's Date: 03/29/2021    History of Present Illness 70 y/o male presented to ED on 7/29 with suddden onset of dizziness. MRI revealed punctate infarction in R temporal lobe as well as punctate infarct in posterior R frontoparietal lobe. 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    PT Comments    Pt motivated to mobilize OOB. Pt with significant difficulty following multistep commands today, with slowed processing time appreciated. Pt ambulatory at min guard level, demonstrates high fall risk with DGI Score of 14/24. PT feels pt is still appropriate for HHPT level with initially increased support from his brother who lives close by. Pt expresses understanding when PT explained needed increased supervision at d/c for pt safety and decreasing fall risk. Will continue to follow.   Follow Up Recommendations  Home health PT     Equipment Recommendations  None recommended by PT    Recommendations for Other Services       Precautions / Restrictions Precautions Precautions: Fall Restrictions Weight Bearing Restrictions: No    Mobility  Bed Mobility Overal bed mobility: Needs Assistance Bed Mobility: Supine to Sit;Sit to Supine     Supine to sit: Supervision Sit to supine: Supervision   General bed mobility comments: for safety, increased time    Transfers Overall transfer level: Needs assistance Equipment used: None Transfers: Sit to/from Stand Sit to Stand: Supervision         General transfer comment: supervision for safety  Ambulation/Gait Ambulation/Gait assistance: Min guard Gait Distance (Feet): 280 Feet Assistive device: None Gait Pattern/deviations: Step-through pattern;Decreased stride length;Drifts right/left;Decreased dorsiflexion - left Gait velocity: decr   General Gait Details: min guard for safety, especially during  challenges to dynamic balance. verbal cuing for increasing LLE clearance   Stairs             Wheelchair Mobility    Modified Rankin (Stroke Patients Only) Modified Rankin (Stroke Patients Only) Pre-Morbid Rankin Score: No symptoms Modified Rankin: Moderately severe disability     Balance Overall balance assessment: Mild deficits observed, not formally tested;Needs assistance Sitting-balance support: Feet supported Sitting balance-Leahy Scale: Good     Standing balance support: No upper extremity supported Standing balance-Leahy Scale: Fair                   Standardized Balance Assessment Standardized Balance Assessment : Dynamic Gait Index   Dynamic Gait Index Level Surface: Mild Impairment Change in Gait Speed: Mild Impairment Gait with Horizontal Head Turns: Moderate Impairment Gait with Vertical Head Turns: Mild Impairment Gait and Pivot Turn: Mild Impairment Step Over Obstacle: Moderate Impairment Step Around Obstacles: Mild Impairment Steps: Mild Impairment Total Score: 14      Cognition Arousal/Alertness: Awake/alert Behavior During Therapy: WFL for tasks assessed/performed Overall Cognitive Status: Impaired/Different from baseline Area of Impairment: Attention;Following commands;Safety/judgement;Problem solving                   Current Attention Level: Sustained   Following Commands: Follows multi-step commands inconsistently;Follows one step commands with increased time     Problem Solving: Slow processing;Difficulty sequencing;Requires verbal cues;Requires tactile cues General Comments: struggles with multitasking (walking and following commands, has to stop walking to perform what PT asks)      Exercises      General Comments        Pertinent Vitals/Pain Pain Assessment: No/denies pain    Home Living  Prior Function            PT Goals (current goals can now be found in the care plan  section) Acute Rehab PT Goals Patient Stated Goal: To not have anymore strokes PT Goal Formulation: With patient Time For Goal Achievement: 04/10/21 Potential to Achieve Goals: Good Progress towards PT goals: Progressing toward goals    Frequency    Min 4X/week      PT Plan Current plan remains appropriate    Co-evaluation              AM-PAC PT "6 Clicks" Mobility   Outcome Measure  Help needed turning from your back to your side while in a flat bed without using bedrails?: None Help needed moving from lying on your back to sitting on the side of a flat bed without using bedrails?: None Help needed moving to and from a bed to a chair (including a wheelchair)?: A Little Help needed standing up from a chair using your arms (e.g., wheelchair or bedside chair)?: A Little Help needed to walk in hospital room?: A Little Help needed climbing 3-5 steps with a railing? : A Little 6 Click Score: 20    End of Session Equipment Utilized During Treatment: Gait belt Activity Tolerance: Patient tolerated treatment well Patient left: in bed;with call bell/phone within reach;with bed alarm set Nurse Communication: Mobility status PT Visit Diagnosis: Unsteadiness on feet (R26.81);Muscle weakness (generalized) (M62.81)     Time: 0092-3300 PT Time Calculation (min) (ACUTE ONLY): 19 min  Charges:  $Gait Training: 8-22 mins                     Marye Round, PT DPT Acute Rehabilitation Services Pager [phone]  Office [phone]    Ellen Mayol E Christain [place] 03/29/2021, 10:29 AM

## 2021-03-29 NOTE — Progress Notes (Signed)
Patient discharging home per MD order. Discharge instructions, medications, and follow up appointments were reviewed with patient, patients brother, and the patients nephew. All questions answered and made sure they understood discharge information. Patient in no distress at time of discharge.

## 2021-03-31 ENCOUNTER — Other Ambulatory Visit (HOSPITAL_COMMUNITY): Payer: Self-pay

## 2021-04-08 ENCOUNTER — Telehealth (HOSPITAL_COMMUNITY): Payer: Self-pay | Admitting: Pharmacist

## 2021-04-08 ENCOUNTER — Other Ambulatory Visit (HOSPITAL_COMMUNITY): Payer: Self-pay

## 2021-04-08 NOTE — Telephone Encounter (Signed)
Pharmacy Transitions of Care Follow-up Telephone Call  Date of discharge: 03/29/21  Discharge Diagnosis: stroke  How have you been since you were released from the hospital?  Overall well but lacks understanding of medications and has transportation issues.   Medication changes made at discharge:  - START: Clopidogrel  - STOPPED: Nicotine gum  - CHANGED: atorvastatin  Medication changes verified by the patient?  Yes, pt has medications and [facility] is assisting with managing this for him.    After getting patient's permission, I was able to speak directly with PT from [facility] who was in the home at the time of my call.  They confirmed that he has the medications available and they are working on ordering a nursing visit, in addition to a PT visit, to help patient better manage his medications.   Medication Accessibility:  Home Pharmacy: [facility] [address]   Was the patient provided with refills on discharged medications? Yes, on clopidogrel   Have all prescriptions been transferred from [facility] to home pharmacy?  yes  Is the patient able to afford medications? PartD Plan Notable copays: generic Eligible patient assistance: n/a    Medication Review:  CLOPIDOGREL (PLAVIX) Clopidogrel 75 mg once daily.  - Educated patient on expected duration of therapy of ASA+clopidogrel of 3 weeks then clopidogrel alone.  - Reviewed potential DDIs with patient  - Advised patient of medications to avoid (NSAIDs, ASA)  - Educated that Tylenol (acetaminophen) will be the preferred analgesic to prevent risk of bleeding  - Emphasized importance of monitoring for signs and symptoms of bleeding (abnormal bruising, prolonged bleeding, nose bleeds, bleeding from gums, discolored urine, black tarry stools)  - Advised patient to alert all providers of anticoagulation therapy prior to starting a new medication or having a procedure    Follow-up Appointments:  PCP  Hospital f/u appt confirmed? None Scheduled   Specialist Hospital f/u appt confirmed? Scheduled to see [facility] on 04/29/21 @ 2:30.   If their condition worsens, is the pt aware to call PCP or go to the Emergency Dept.? yes  Final Patient Assessment:   Pt was pleasant but confused.  He also stated multiple times that he is uneducated and has difficulty reading/writing.  I was able to speak directly with a home health physical therapist who was at the home and she is also assisting with medications and arranging transportation to patient's upcoming appointment.

## 2021-04-29 ENCOUNTER — Ambulatory Visit (INDEPENDENT_AMBULATORY_CARE_PROVIDER_SITE_OTHER): Payer: Medicare Other | Admitting: Neurology

## 2021-04-29 ENCOUNTER — Encounter: Payer: Self-pay | Admitting: Neurology

## 2021-04-29 ENCOUNTER — Other Ambulatory Visit: Payer: Self-pay

## 2021-04-29 VITALS — BP 113/75 | HR 78 | Ht 72.0 in | Wt 171.5 lb

## 2021-04-29 DIAGNOSIS — I639 Cerebral infarction, unspecified: Secondary | ICD-10-CM | POA: Diagnosis not present

## 2021-04-29 MED ORDER — CLOPIDOGREL BISULFATE 75 MG PO TABS: 75.0000 mg | ORAL_TABLET | Freq: Every day | ORAL | 4 refills | Status: DC

## 2021-04-29 NOTE — Progress Notes (Signed)
Chief Complaint  Patient presents with   New Patient (Initial Visit)    New Patient:  Has had 3 strokes this year, internal referral Room 16, alone in room      ASSESSMENT AND PLAN  Isaac Stokes is a 70 y.o. male   Small vessel acute stroke  Vascular risk factor of aging, smoking, hyperlipidemia,  Echocardiogram showed no significant abnormality  Ultrasound of carotid arteries showed no large vessel disease  Keep Plavix 75 mg daily  Continue to address vascular risk factor, goal LDL less than 70, stop smoking, increase water intake,  Continue follow-up with primary care physician, potential work-up for mild anemia  DIAGNOSTIC DATA (LABS, IMAGING, TESTING) - I reviewed patient records, labs, notes, testing and imaging myself where available.  MRI brain on July 30 22. 1. 3 mm acute ischemic nonhemorrhagic right temporal lobe infarct. 2. Additional possible tiny acute ischemic potential work-up nonhemorrhagic posterior right frontoparietal infarct as above. 3. Underlying age-related cerebral atrophy with moderate chronic microvascular ischemic disease, with additional chronic ischemic infarcts as above.   Echocardiogram in July 2022  Left Ventricle: Left ventricular ejection fraction, by estimation, is 55  to 60%. The left ventricle has normal function. The left ventricle has no  regional wall motion abnormalities. The left ventricular internal cavity  size was normal in size.   Ultrasound of carotid artery, no significant large vessel disease anterograde flow of bilateral vertebral artery   Laboratory evaluations showed CMP,creat 1.7,  Hg 11.9,  A1C 5.5 UDS negative, LDL 166.    MEDICAL HISTORY:  Isaac Stokes is a 70 years old male, seen in request by his primary care physician Dr. Iver Nestle, Judie Bonus for evaluation of stroke, he is alone at today's visit on April 29, 2021  I reviewed and summarized the referring note.PMHX. HLD Smoke 4 cigerate,   He lives  alone, his brother dropped him at the office visit, he smoked few cigarettes each day, retired from Set designer job at age 33,  Reported for stroke 14 years ago, with mild residual left-sided weakness  Second stroke on Jan 26, 2021, small acute infarction of the lateral right thalamus, personally reviewed MRI, also evidence of a left PCA occlusion with old left occipital infarction, moderate small vessel disease, after stroke in May, he was put on dual antiplatelet therapy aspirin plus Plavix, then aspirin 81 mg alone  On March 26 2021, he called ambulance, feeling dizzy, generalized weakness, sweat all over, MRI of the brain showed acute stroke involving right temporal lobe, right frontal parietal infarction  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  PHYSICAL EXAMNIATION:  Gen: NAD, conversant, well nourised, well groomed                     Cardiovascular: Regular rate rhythm, no peripheral edema, warm, nontender. Eyes: Conjunctivae clear without exudates or hemorrhage Neck: Supple, no carotid bruits. Pulmonary: Clear to auscultation bilaterally   NEUROLOGICAL EXAM:  MENTAL STATUS: Speech:    Speech is normal; fluent and spontaneous with normal comprehension.  Cognition:     Orientation to time, place and person  Normal recent and remote memory     Normal Attention span and concentration     Normal Language, naming, repeating,spontaneous speech     Fund of knowledge   CRANIAL NERVES: CN II: Visual fields are full to confrontation. Pupils are round equal and briskly reactive to light. CN III, IV, VI: extraocular movement are normal. No ptosis. CN V: Facial  sensation is intact to light touch CN VII: Face is symmetric with normal eye closure  CN VIII: Hearing is normal to causal conversation. CN IX, X: Phonation is normal. CN XI: Head turning and shoulder shrug are intact  MOTOR: Mild fixation of left arm on rapid rotating movement  REFLEXES: Reflexes are 1 and symmetric at the biceps, triceps, knees, and ankles. Plantar responses are flexor.  SENSORY: Intact to light touch, pinprick and vibratory sensation are intact in fingers and toes.  COORDINATION: There is no trunk or limb dysmetria noted.  GAIT/STANCE: Steady REVIEW OF SYSTEMS:  Full 14 system review of systems performed and notable only for as above All other review of systems were negative.   ALLERGIES: No Known Allergies  HOME MEDICATIONS: Current Outpatient Medications  Medication Sig Dispense Refill   atorvastatin (LIPITOR) 80 MG tablet Take 1 tablet (80 mg total) by mouth daily. 30 tablet 0   ciclopirox (PENLAC) 8 % solution Apply 1 application topically at bedtime. Apply over nail and surrounding skin. Apply daily over previous coat. After seven (7) days, may remove with alcohol and continue cycle.     clopidogrel (PLAVIX) 75 MG tablet Take 1 tablet (75 mg total) by mouth daily. 30 tablet 1   No current facility-administered medications for this visit.    PAST MEDICAL HISTORY: Past Medical History:  Diagnosis Date   TIA (transient ischemic attack)     PAST SURGICAL HISTORY: History reviewed. No pertinent surgical history.  FAMILY HISTORY: History reviewed. No pertinent family history.  SOCIAL HISTORY: Social History   Socioeconomic History   Marital status: Single    Spouse name: Not on file   Number of children: Not on file   Years of education: Not on file   Highest education level: Not on file  Occupational History   Not on file  Tobacco Use   Smoking status: Light Smoker    Types: Cigarettes   Smokeless tobacco: Never  Substance and Sexual  Activity   Alcohol use: Never   Drug use: Never   Sexual activity: Not on file  Other Topics Concern   Not on file  Social History Narrative   Lives alone in [address]   Right Handed   Drinks 2-3 cups caffeine daily   Social Determinants of Health   Financial Resource Strain: Not on file  Food Insecurity: Not on file  Transportation Needs: Not on file  Physical Activity: Not on file  Stress: Not on file  Social Connections: Not on file  Intimate Partner Violence: Not on file      Levert Feinstein, M.D. Ph.D.  [facility] [address], [address] [place], [place] [address] Ph: [phone] Fax: [phone]  CC:  Gordy Councilman, MD [address] [place],  [place] [address]  Ellyn Hack, MD

## 2021-04-29 NOTE — Patient Instructions (Signed)
Stop aspirin   Meds ordered this encounter  Medications   clopidogrel (PLAVIX) 75 MG tablet    Sig: Take 1 tablet (75 mg total) by mouth daily.    Dispense:  90 tablet    Refill:  4     Drink a lots of water.  Keep follow up with primary physician

## 2021-06-07 ENCOUNTER — Telehealth: Payer: Self-pay

## 2021-06-07 NOTE — Telephone Encounter (Signed)
Dr. Hoyle Barr nurse requested a chart review since the patient was scheduled with him for PFO consult. Spoke with the patient in detail. Offered him several appointments but he declined. Scheduled him 11/16 with Dr. Excell Seltzer for PFO consult. He repeated several times how disappointed and frustrated he is about having to wait even longer for a consult, despite declining several appointments. He reiterated that he will call back to cancel if he decides he "doesn't want to deal with [facility] anymore."

## 2021-06-09 ENCOUNTER — Ambulatory Visit: Payer: Medicare Other | Admitting: Interventional Cardiology

## 2021-06-22 ENCOUNTER — Ambulatory Visit: Payer: Medicare Other | Admitting: Podiatry

## 2021-06-24 ENCOUNTER — Ambulatory Visit: Payer: Medicare Other | Admitting: Podiatry

## 2021-07-14 ENCOUNTER — Ambulatory Visit (INDEPENDENT_AMBULATORY_CARE_PROVIDER_SITE_OTHER): Payer: Medicare Other | Admitting: Cardiovascular Disease

## 2021-07-14 ENCOUNTER — Encounter: Payer: Self-pay | Admitting: Cardiovascular Disease

## 2021-07-14 ENCOUNTER — Other Ambulatory Visit: Payer: Self-pay

## 2021-07-14 VITALS — BP 130/90 | HR 71 | Ht 72.0 in | Wt 182.4 lb

## 2021-07-14 DIAGNOSIS — Q2112 Patent foramen ovale: Secondary | ICD-10-CM | POA: Diagnosis not present

## 2021-07-14 NOTE — Patient Instructions (Signed)
Medication Instructions:  Your physician recommends that you continue on your current medications as directed. Please refer to the Current Medication list given to you today.  *If you need a refill on your cardiac medications before your next appointment, please call your pharmacy*  Lab Work: Your physician recommends that you return for lab work in: 07/26/21 for BMET and CBC  If you have labs (blood work) drawn today and your tests are completely normal, you will receive your results only by: MyChart Message (if you have MyChart) OR A paper copy in the mail If you have any lab test that is abnormal or we need to change your treatment, we will call you to review the results.  Testing/Procedures: Your physician has requested that you have a TEE. During a TEE, sound waves are used to create images of your heart. It provides your doctor with information about the size and shape of your heart and how well your heart's chambers and valves are working. In this test, a transducer is attached to the end of a flexible tube that's guided down your throat and into your esophagus (the tube leading from you mouth to your stomach) to get a more detailed image of your heart. You are not awake for the procedure. Please see the instruction sheet given to you today. For further information please visit [email or web address]  Follow-Up: At [facility], you and your health needs are our priority.  As part of our continuing mission to provide you with exceptional heart care, we have created designated Provider Care Teams.  These Care Teams include your primary Cardiologist (physician) and Advanced Practice Providers (APPs -  Physician Assistants and Nurse Practitioners) who all work together to provide you with the care you need, when you need it.  We recommend signing up for the patient portal called "MyChart".  Sign up information is provided on this After Visit Summary.  MyChart is used to connect with patients  for Virtual Visits (Telemedicine).  Patients are able to view lab/test results, encounter notes, upcoming appointments, etc.  Non-urgent messages can be sent to your provider as well.   To learn more about what you can do with MyChart, go to [email or web address].    Your next appointment:   You will be called after your test to setup follow-up appointment  The format for your next appointment:   In Person  Provider:   Dr. Tonny Bollman If MD is not listed, click here to update    :1}    Other Instructions You are scheduled for a TEE on 07/29/21 with Dr. Elease Hashimoto.  Please arrive at the [facility] (Main Entrance A) at [facility]: [address] [place], [place] [address] at 8:00 am.   DIET: Nothing to eat or drink after midnight except a sip of water with medications (see medication instructions below)  FYI: For your safety, and to allow [place] to monitor your vital signs accurately during the surgery/procedure we request that   if you have artificial nails, gel coating, SNS etc. Please have those removed prior to your surgery/procedure. Not having the nail coverings /polish removed may result in cancellation or delay of your surgery/procedure.   Medication Instructions: Please continue all your medications as prescribed   Labs: 07/26/21 Come to the lab at [address] [facility] between the hours of 8:00 am and 4:30 pm. You do not have to be fasting.  You must have a responsible person to drive you home and stay in the waiting  area during your procedure. Failure to do so could result in cancellation.  Bring your insurance cards.  *Special Note: Every effort is made to have your procedure done on time. Occasionally there are emergencies that occur at the hospital that may cause delays. Please be patient if a delay does occur.

## 2021-07-14 NOTE — Progress Notes (Signed)
Cardiology Office Note:    Date:  07/14/2021   ID:  Isaac [facility], DOB 1951/06/04, MRN [id number]  PCP:  Roselee Nova, MD   [facility] Providers Cardiologist:  None     Referring MD: Geradine Girt, DO   Chief Complaint  Patient presents with   PFO     History of Present Illness:    Isaac [facility] is a 70 y.o. male presenting for PFO evaluation, referred by Dr Eliseo Squires.   The patient is here alone today.  He has had recurrent strokes.  He initially reported stroke about 14 years ago.  He had another event in May 2022 and has residual left leg numbness related to this.  A third event happened March 26, 2021 when he had sudden onset of dizziness and some expressive aphasia.  Symptoms resolved fairly quickly.  An MRI of the brain showed a 3 mm acute ischemic right temporal infarct and additional possible tiny acute ischemic infarcts in the posterior right frontoparietal lobes.  The patient was not found to have any major large vessel atherosclerotic disease during his evaluation.  A limited echo bubble study showed a small right to left shunt and the patient is referred today for further evaluation of PFO.  He has had no recurrent neurologic symptoms.  He is a longtime heavy smoker, used to smoke over 2 packs/day.  He has cut back and is only smoking about 6 cigarettes/day now.  The patient has no chest pain or shortness of breath with his normal daily activities.  He lives alone in a senior apartment near [employer].  Past Medical History:  Diagnosis Date   TIA (transient ischemic attack)     History reviewed. No pertinent surgical history.  Current Medications: Current Meds  Medication Sig   atorvastatin (LIPITOR) 40 MG tablet Take 40 mg by mouth daily as needed.   clopidogrel (PLAVIX) 75 MG tablet Take 1 tablet (75 mg total) by mouth daily.     Allergies:   Patient has no known allergies.   Social History   Socioeconomic History   Marital status: Single     Spouse name: Not on file   Number of children: Not on file   Years of education: Not on file   Highest education level: Not on file  Occupational History   Not on file  Tobacco Use   Smoking status: Light Smoker    Types: Cigarettes   Smokeless tobacco: Never  Substance and Sexual Activity   Alcohol use: Never   Drug use: Never   Sexual activity: Not on file  Other Topics Concern   Not on file  Social History Narrative   Lives alone in [address]   Right Handed   Drinks 2-3 cups caffeine daily   Social Determinants of Health   Financial Resource Strain: Not on file  Food Insecurity: Not on file  Transportation Needs: Not on file  Physical Activity: Not on file  Stress: Not on file  Social Connections: Not on file     Family History: The patient's family history is positive for stroke in his father  ROS:   Please see the history of present illness.    All other systems reviewed and are negative.  EKGs/Labs/Other Studies Reviewed:    The following studies were reviewed today: Echo 03/27/2021: 1. Left ventricular ejection fraction, by estimation, is 55 to 60%. The  left ventricle has normal function. The left ventricle has no regional  wall motion abnormalities. Left ventricular diastolic parameters are  consistent with Grade I diastolic  dysfunction (impaired relaxation).   2. Right ventricular systolic function is normal. The right ventricular  size is normal.   3. Trivial mitral valve regurgitation. No evidence of mitral stenosis.   4. The aortic valve is tricuspid. Aortic valve regurgitation is mild.   5. Agitated saline contrast bubble study was positive with shunting  observed within 3-6 cardiac cycles suggestive of interatrial shunt. Small  shunt with minimal bubbles ( < 10) during initial cardiac cycles.   Comparison(s): A prior study was performed on 01/27/2021. No significant  change from prior study.   EKG:  EKG is ordered today.  The ekg ordered  today demonstrates normal sinus rhythm 73 beats per, left axis deviation.   Recent Labs: 01/28/2021: Magnesium 2.1 03/26/2021: ALT 10 03/28/2021: BUN 31; Creatinine, Ser 1.81; Hemoglobin 10.6; Platelets 182; Potassium 4.6; Sodium 137  Recent Lipid Panel    Component Value Date/Time   CHOL 234 (H) 01/27/2021 0310   TRIG 140 01/27/2021 0310   HDL 40 (L) 01/27/2021 0310   CHOLHDL 5.9 01/27/2021 0310   VLDL 28 01/27/2021 0310   LDLCALC 166 (H) 01/27/2021 0310     Risk Assessment/Calculations:           Physical Exam:    VS:  BP 130/90   Pulse 71   Ht 6' (1.829 m)   Wt 182 lb 6.4 oz (82.7 kg)   SpO2 98%   BMI 24.74 kg/m     Wt Readings from Last 3 Encounters:  07/14/21 182 lb 6.4 oz (82.7 kg)  04/29/21 171 lb 8 oz (77.8 kg)  03/27/21 167 lb 15.9 oz (76.2 kg)     GEN:  Well nourished, well developed in no acute distress HEENT: Normal NECK: No JVD; No carotid bruits LYMPHATICS: No lymphadenopathy CARDIAC: RRR, no murmurs, rubs, gallops RESPIRATORY:  Clear to auscultation without rales, wheezing or rhonchi  ABDOMEN: Soft, non-tender, non-distended MUSCULOSKELETAL:  No edema; No deformity  SKIN: Warm and dry NEUROLOGIC:  Alert and oriented x 3 PSYCHIATRIC:  Normal affect   ASSESSMENT:    1. PFO (patent foramen ovale)    PLAN:    In order of problems listed above:  The patient has had recurrent ischemic strokes.  His stroke risk factors include age, tobacco use, and hyperlipidemia.  I personally reviewed his echo images which show normal LV and RV function and no significant valvular disease.  He has a positive bubble study, but it is not very impressive.  There are early bubbles reported suggestive of right to left shunting at an intracardiac level.  The shunt size appears small, but the imaging is suboptimal.  I think in order to better sort this out in a patient with recurrent strokes, it is important to evaluate for cardiac source of embolus with transesophageal echo.   This will also define whether the patient in fact has a PFO of clinical significance.  I have reviewed the risks, indications, and alternatives to transesophageal echocardiography with the patient.  He understands that the risks of serious complication occur at very low frequency.  I will be in touch with him after his TEE test to review whether there is a clinically significant PFO that would be amenable to transcatheter closure.  The patient is on appropriate medical therapy with a high intensity statin drug and clopidogrel.      Shared Decision Making/Informed Consent The risks [esophageal damage, perforation (1:10,000 risk),  bleeding, pharyngeal hematoma as well as other potential complications associated with conscious sedation including aspiration, arrhythmia, respiratory failure and death], benefits (treatment guidance and diagnostic support) and alternatives of a transesophageal echocardiogram were discussed in detail with Mr. Benney and he is willing to proceed.     Medication Adjustments/Labs and Tests Ordered: Current medicines are reviewed at length with the patient today.  Concerns regarding medicines are outlined above.  Orders Placed This Encounter  Procedures   Basic metabolic panel   CBC with Differential/Platelet   EKG 12-Lead   No orders of the defined types were placed in this encounter.   Patient Instructions  Medication Instructions:  Your physician recommends that you continue on your current medications as directed. Please refer to the Current Medication list given to you today.  *If you need a refill on your cardiac medications before your next appointment, please call your pharmacy*  Lab Work: Your physician recommends that you return for lab work in: 07/26/21 for BMET and CBC  If you have labs (blood work) drawn today and your tests are completely normal, you will receive your results only by: MyChart Message (if you have MyChart) OR A paper copy in the  mail If you have any lab test that is abnormal or we need to change your treatment, we will call you to review the results.  Testing/Procedures: Your physician has requested that you have a TEE. During a TEE, sound waves are used to create images of your heart. It provides your doctor with information about the size and shape of your heart and how well your heart's chambers and valves are working. In this test, a transducer is attached to the end of a flexible tube that's guided down your throat and into your esophagus (the tube leading from you mouth to your stomach) to get a more detailed image of your heart. You are not awake for the procedure. Please see the instruction sheet given to you today. For further information please visit [email or web address]  Follow-Up: At [facility], you and your health needs are our priority.  As part of our continuing mission to provide you with exceptional heart care, we have created designated Provider Care Teams.  These Care Teams include your primary Cardiologist (physician) and Advanced Practice Providers (APPs -  Physician Assistants and Nurse Practitioners) who all work together to provide you with the care you need, when you need it.  We recommend signing up for the patient portal called "MyChart".  Sign up information is provided on this After Visit Summary.  MyChart is used to connect with patients for Virtual Visits (Telemedicine).  Patients are able to view lab/test results, encounter notes, upcoming appointments, etc.  Non-urgent messages can be sent to your provider as well.   To learn more about what you can do with MyChart, go to [email or web address].    Your next appointment:   You will be called after your test to setup follow-up appointment  The format for your next appointment:   In Person  Provider:   Dr. Tonny Bollman If MD is not listed, click here to update    :1}    Other Instructions You are scheduled for a TEE on 07/29/21  with Dr. Elease Hashimoto.  Please arrive at the [facility] (Main Entrance A) at [facility]: [address] [place], [place] [address] at 8:00 am.   DIET: Nothing to eat or drink after midnight except a sip of water with medications (see medication instructions below)  FYI: For your safety, and to allow [place] to monitor your vital signs accurately during the surgery/procedure we request that   if you have artificial nails, gel coating, SNS etc. Please have those removed prior to your surgery/procedure. Not having the nail coverings /polish removed may result in cancellation or delay of your surgery/procedure.   Medication Instructions: Please continue all your medications as prescribed   Labs: 07/26/21 Come to the lab at [place] between the hours of 8:00 am and 4:30 pm. You do not have to be fasting.  You must have a responsible person to drive you home and stay in the waiting area during your procedure. Failure to do so could result in cancellation.  Bring your insurance cards.  *Special Note: Every effort is made to have your procedure done on time. Occasionally there are emergencies that occur at the hospital that may cause delays. Please be patient if a delay does occur.     Signed, Sherren Mocha, MD  07/14/2021 5:22 PM    [facility]

## 2021-07-14 NOTE — H&P (View-Only) (Signed)
Cardiology Office Note:    Date:  07/14/2021   ID:  Isaac [facility], DOB 1951/07/09, MRN [id number]  PCP:  Isaac Nova, MD   [facility] Providers Cardiologist:  None     Referring MD: Geradine Girt, DO   Chief Complaint  Patient presents with   PFO     History of Present Illness:    Isaac [facility] is a 70 y.o. male presenting for PFO evaluation, referred by Dr Isaac [facility].   The patient is here alone today.  He has had recurrent strokes.  He initially reported stroke about 14 years ago.  He had another event in May 2022 and has residual left leg numbness related to this.  A third event happened March 26, 2021 when he had sudden onset of dizziness and some expressive aphasia.  Symptoms resolved fairly quickly.  An MRI of the brain showed a 3 mm acute ischemic right temporal infarct and additional possible tiny acute ischemic infarcts in the posterior right frontoparietal lobes.  The patient was not found to have any major large vessel atherosclerotic disease during his evaluation.  A limited echo bubble study showed a small right to left shunt and the patient is referred today for further evaluation of PFO.  He has had no recurrent neurologic symptoms.  He is a longtime heavy smoker, used to smoke over 2 packs/day.  He has cut back and is only smoking about 6 cigarettes/day now.  The patient has no chest pain or shortness of breath with his normal daily activities.  He lives alone in a senior apartment near [employer].  Past Medical History:  Diagnosis Date   TIA (transient ischemic attack)     History reviewed. No pertinent surgical history.  Current Medications: Current Meds  Medication Sig   atorvastatin (LIPITOR) 40 MG tablet Take 40 mg by mouth daily as needed.   clopidogrel (PLAVIX) 75 MG tablet Take 1 tablet (75 mg total) by mouth daily.     Allergies:   Patient has no known allergies.   Social History   Socioeconomic History   Marital status: Single     Spouse name: Not on file   Number of children: Not on file   Years of education: Not on file   Highest education level: Not on file  Occupational History   Not on file  Tobacco Use   Smoking status: Light Smoker    Types: Cigarettes   Smokeless tobacco: Never  Substance and Sexual Activity   Alcohol use: Never   Drug use: Never   Sexual activity: Not on file  Other Topics Concern   Not on file  Social History Narrative   Lives alone in [address]   Right Handed   Drinks 2-3 cups caffeine daily   Social Determinants of Health   Financial Resource Strain: Not on file  Food Insecurity: Not on file  Transportation Needs: Not on file  Physical Activity: Not on file  Stress: Not on file  Social Connections: Not on file     Family History: The patient's family history is positive for stroke in his father  ROS:   Please see the history of present illness.    All other systems reviewed and are negative.  EKGs/Labs/Other Studies Reviewed:    The following studies were reviewed today: Echo 03/27/2021: 1. Left ventricular ejection fraction, by estimation, is 55 to 60%. The  left ventricle has normal function. The left ventricle has no regional  wall motion abnormalities. Left ventricular diastolic parameters are  consistent with Grade I diastolic  dysfunction (impaired relaxation).   2. Right ventricular systolic function is normal. The right ventricular  size is normal.   3. Trivial mitral valve regurgitation. No evidence of mitral stenosis.   4. The aortic valve is tricuspid. Aortic valve regurgitation is mild.   5. Agitated saline contrast bubble study was positive with shunting  observed within 3-6 cardiac cycles suggestive of interatrial shunt. Small  shunt with minimal bubbles ( < 10) during initial cardiac cycles.   Comparison(s): A prior study was performed on 01/27/2021. No significant  change from prior study.   EKG:  EKG is ordered today.  The ekg ordered  today demonstrates normal sinus rhythm 73 beats per, left axis deviation.   Recent Labs: 01/28/2021: Magnesium 2.1 03/26/2021: ALT 10 03/28/2021: BUN 31; Creatinine, Ser 1.81; Hemoglobin 10.6; Platelets 182; Potassium 4.6; Sodium 137  Recent Lipid Panel    Component Value Date/Time   CHOL 234 (H) 01/27/2021 0310   TRIG 140 01/27/2021 0310   HDL 40 (L) 01/27/2021 0310   CHOLHDL 5.9 01/27/2021 0310   VLDL 28 01/27/2021 0310   LDLCALC 166 (H) 01/27/2021 0310     Risk Assessment/Calculations:           Physical Exam:    VS:  BP 130/90   Pulse 71   Ht 6' (1.829 m)   Wt 182 lb 6.4 oz (82.7 kg)   SpO2 98%   BMI 24.74 kg/m     Wt Readings from Last 3 Encounters:  07/14/21 182 lb 6.4 oz (82.7 kg)  04/29/21 171 lb 8 oz (77.8 kg)  03/27/21 167 lb 15.9 oz (76.2 kg)     GEN:  Well nourished, well developed in no acute distress HEENT: Normal NECK: No JVD; No carotid bruits LYMPHATICS: No lymphadenopathy CARDIAC: RRR, no murmurs, rubs, gallops RESPIRATORY:  Clear to auscultation without rales, wheezing or rhonchi  ABDOMEN: Soft, non-tender, non-distended MUSCULOSKELETAL:  No edema; No deformity  SKIN: Warm and dry NEUROLOGIC:  Alert and oriented x 3 PSYCHIATRIC:  Normal affect   ASSESSMENT:    1. PFO (patent foramen ovale)    PLAN:    In order of problems listed above:  The patient has had recurrent ischemic strokes.  His stroke risk factors include age, tobacco use, and hyperlipidemia.  I personally reviewed his echo images which show normal LV and RV function and no significant valvular disease.  He has a positive bubble study, but it is not very impressive.  There are early bubbles reported suggestive of right to left shunting at an intracardiac level.  The shunt size appears small, but the imaging is suboptimal.  I think in order to better sort this out in a patient with recurrent strokes, it is important to evaluate for cardiac source of embolus with transesophageal echo.   This will also define whether the patient in fact has a PFO of clinical significance.  I have reviewed the risks, indications, and alternatives to transesophageal echocardiography with the patient.  He understands that the risks of serious complication occur at very low frequency.  I will be in touch with him after his TEE test to review whether there is a clinically significant PFO that would be amenable to transcatheter closure.  The patient is on appropriate medical therapy with a high intensity statin drug and clopidogrel.      Shared Decision Making/Informed Consent The risks [esophageal damage, perforation (1:10,000 risk),  bleeding, pharyngeal hematoma as well as other potential complications associated with conscious sedation including aspiration, arrhythmia, respiratory failure and death], benefits (treatment guidance and diagnostic support) and alternatives of a transesophageal echocardiogram were discussed in detail with Mr. Benney and he is willing to proceed.     Medication Adjustments/Labs and Tests Ordered: Current medicines are reviewed at length with the patient today.  Concerns regarding medicines are outlined above.  Orders Placed This Encounter  Procedures   Basic metabolic panel   CBC with Differential/Platelet   EKG 12-Lead   No orders of the defined types were placed in this encounter.   Patient Instructions  Medication Instructions:  Your physician recommends that you continue on your current medications as directed. Please refer to the Current Medication list given to you today.  *If you need a refill on your cardiac medications before your next appointment, please call your pharmacy*  Lab Work: Your physician recommends that you return for lab work in: 07/26/21 for BMET and CBC  If you have labs (blood work) drawn today and your tests are completely normal, you will receive your results only by: MyChart Message (if you have MyChart) OR A paper copy in the  mail If you have any lab test that is abnormal or we need to change your treatment, we will call you to review the results.  Testing/Procedures: Your physician has requested that you have a TEE. During a TEE, sound waves are used to create images of your heart. It provides your doctor with information about the size and shape of your heart and how well your heart's chambers and valves are working. In this test, a transducer is attached to the end of a flexible tube that's guided down your throat and into your esophagus (the tube leading from you mouth to your stomach) to get a more detailed image of your heart. You are not awake for the procedure. Please see the instruction sheet given to you today. For further information please visit [email or web address]  Follow-Up: At [facility], you and your health needs are our priority.  As part of our continuing mission to provide you with exceptional heart care, we have created designated Provider Care Teams.  These Care Teams include your primary Cardiologist (physician) and Advanced Practice Providers (APPs -  Physician Assistants and Nurse Practitioners) who all work together to provide you with the care you need, when you need it.  We recommend signing up for the patient portal called "MyChart".  Sign up information is provided on this After Visit Summary.  MyChart is used to connect with patients for Virtual Visits (Telemedicine).  Patients are able to view lab/test results, encounter notes, upcoming appointments, etc.  Non-urgent messages can be sent to your provider as well.   To learn more about what you can do with MyChart, go to [email or web address].    Your next appointment:   You will be called after your test to setup follow-up appointment  The format for your next appointment:   In Person  Provider:   Dr. Tonny Bollman If MD is not listed, click here to update    :1}    Other Instructions You are scheduled for a TEE on 07/29/21  with Dr. Elease Hashimoto.  Please arrive at the [facility] (Main Entrance A) at [facility]: [address] [place], [place] [address] at 8:00 am.   DIET: Nothing to eat or drink after midnight except a sip of water with medications (see medication instructions below)  FYI: For your safety, and to allow [place] to monitor your vital signs accurately during the surgery/procedure we request that   if you have artificial nails, gel coating, SNS etc. Please have those removed prior to your surgery/procedure. Not having the nail coverings /polish removed may result in cancellation or delay of your surgery/procedure.   Medication Instructions: Please continue all your medications as prescribed   Labs: 07/26/21 Come to the lab at [place] between the hours of 8:00 am and 4:30 pm. You do not have to be fasting.  You must have a responsible person to drive you home and stay in the waiting area during your procedure. Failure to do so could result in cancellation.  Bring your insurance cards.  *Special Note: Every effort is made to have your procedure done on time. Occasionally there are emergencies that occur at the [facility] that may cause delays. Please be patient if a delay does occur.     Signed, Sherren Mocha, MD  07/14/2021 5:22 PM    [facility]

## 2021-07-19 ENCOUNTER — Encounter (HOSPITAL_COMMUNITY): Payer: Self-pay | Admitting: Cardiovascular Disease

## 2021-07-26 ENCOUNTER — Other Ambulatory Visit: Payer: Self-pay

## 2021-07-26 ENCOUNTER — Other Ambulatory Visit: Payer: Medicare Other | Admitting: *Deleted

## 2021-07-26 DIAGNOSIS — Q2112 Patent foramen ovale: Secondary | ICD-10-CM

## 2021-07-27 ENCOUNTER — Telehealth: Payer: Self-pay | Admitting: *Deleted

## 2021-07-27 DIAGNOSIS — Q2112 Patent foramen ovale: Secondary | ICD-10-CM

## 2021-07-27 DIAGNOSIS — Z01812 Encounter for preprocedural laboratory examination: Secondary | ICD-10-CM

## 2021-07-27 LAB — CBC WITH DIFFERENTIAL/PLATELET
Basophils Absolute: 0.1 10*3/uL (ref 0.0–0.2)
Basos: 1 %
EOS (ABSOLUTE): 0.1 10*3/uL (ref 0.0–0.4)
Eos: 2 %
Hematocrit: 39.7 % (ref 37.5–51.0)
Hemoglobin: 12.7 g/dL — ABNORMAL LOW (ref 13.0–17.7)
Immature Grans (Abs): 0 10*3/uL (ref 0.0–0.1)
Immature Granulocytes: 1 %
Lymphocytes Absolute: 1.6 10*3/uL (ref 0.7–3.1)
Lymphs: 26 %
MCH: 29.9 pg (ref 26.6–33.0)
MCHC: 32 g/dL (ref 31.5–35.7)
MCV: 93 fL (ref 79–97)
Monocytes Absolute: 0.6 10*3/uL (ref 0.1–0.9)
Monocytes: 9 %
Neutrophils Absolute: 3.8 10*3/uL (ref 1.4–7.0)
Neutrophils: 61 %
Platelets: 201 10*3/uL (ref 150–450)
RBC: 4.25 x10E6/uL (ref 4.14–5.80)
RDW: 12 % (ref 11.6–15.4)
WBC: 6.3 10*3/uL (ref 3.4–10.8)

## 2021-07-27 LAB — BASIC METABOLIC PANEL
BUN/Creatinine Ratio: 15 (ref 10–24)
BUN: 22 mg/dL (ref 8–27)
CO2: 21 mmol/L (ref 20–29)
Calcium: 9.1 mg/dL (ref 8.6–10.2)
Chloride: 104 mmol/L (ref 96–106)
Creatinine, Ser: 1.45 mg/dL — ABNORMAL HIGH (ref 0.76–1.27)
Sodium: 140 mmol/L (ref 134–144)
eGFR: 51 mL/min/{1.73_m2} — ABNORMAL LOW (ref >59)

## 2021-07-27 NOTE — Telephone Encounter (Signed)
Lab Tech Mellody Dance was able to make contact with the pt.  He will come in for redraw of BMET tomorrow 11/30. New order for repeat BMET placed to have done on 11/30. RN Supervisor is aware of lab error and will be making contact with [facility] to discuss this matter in detail.  Will send this message to Dr. Excell Seltzer as an Lorain Childes, to make him aware of this plan.

## 2021-07-27 NOTE — Telephone Encounter (Signed)
Loa Socks, LPN  18/29/9371  4:16 PM EST Back to Top    Called and spoke with our Lab Tech about cancelled K level and glucose level on this pt from lab draw yesterday.  Pt had labs done yesterday for TEE on 12/1 for work-up of PFO closure.  Per Katrina in lab, she will reach out to the pt to have him come in for redraw of BMET tomorrow, for this is needed for procedure on 12/1.  Will speak with Daleen Bo RN Supervisor about lab cancelled and having to get pt to come in for redraw, for she may be able to assist with him not getting charged again for this lab, being this is [facility]'s error.     Off note:  [facility] cancelled the potassium and glucose level and never made contact with the office about this.    Loa Socks, LPN  69/67/8938 10:00 AM EST     Appears potassium level was cancelled with note placed by Lab Tech at [facility]: "Comment: Test not performed.  Serum was in contact with cells when received  which will make the result inaccurate."

## 2021-07-28 ENCOUNTER — Other Ambulatory Visit: Payer: Medicare Other | Admitting: *Deleted

## 2021-07-28 ENCOUNTER — Other Ambulatory Visit: Payer: Self-pay

## 2021-07-28 DIAGNOSIS — Q2112 Patent foramen ovale: Secondary | ICD-10-CM

## 2021-07-28 DIAGNOSIS — Z01812 Encounter for preprocedural laboratory examination: Secondary | ICD-10-CM

## 2021-07-28 LAB — BASIC METABOLIC PANEL
BUN/Creatinine Ratio: 16 (ref 10–24)
BUN: 26 mg/dL (ref 8–27)
CO2: 27 mmol/L (ref 20–29)
Calcium: 9.4 mg/dL (ref 8.6–10.2)
Chloride: 104 mmol/L (ref 96–106)
Creatinine, Ser: 1.64 mg/dL — ABNORMAL HIGH (ref 0.76–1.27)
Glucose: 101 mg/dL — ABNORMAL HIGH (ref 70–99)
Potassium: 4.3 mmol/L (ref 3.5–5.2)
Sodium: 141 mmol/L (ref 134–144)
eGFR: 44 mL/min/{1.73_m2} — ABNORMAL LOW (ref >59)

## 2021-07-29 ENCOUNTER — Ambulatory Visit (HOSPITAL_COMMUNITY): Payer: Medicare Other | Admitting: Certified Registered Nurse Anesthetist

## 2021-07-29 ENCOUNTER — Ambulatory Visit (HOSPITAL_COMMUNITY)
Admission: RE | Admit: 2021-07-29 | Discharge: 2021-07-29 | Disposition: A | Discharge status: Home / Self Care | Payer: Medicare Other | Attending: Cardiovascular Disease | Admitting: Cardiovascular Disease

## 2021-07-29 ENCOUNTER — Other Ambulatory Visit: Payer: Self-pay | Admitting: Cardiovascular Disease

## 2021-07-29 ENCOUNTER — Ambulatory Visit (HOSPITAL_BASED_OUTPATIENT_CLINIC_OR_DEPARTMENT_OTHER): Payer: Medicare Other

## 2021-07-29 ENCOUNTER — Other Ambulatory Visit (HOSPITAL_COMMUNITY): Payer: Self-pay | Admitting: *Deleted

## 2021-07-29 ENCOUNTER — Encounter (HOSPITAL_COMMUNITY)
Admission: RE | Disposition: A | Discharge status: Home / Self Care | Payer: Self-pay | Source: Home / Self Care | Attending: Cardiovascular Disease

## 2021-07-29 ENCOUNTER — Other Ambulatory Visit: Payer: Self-pay

## 2021-07-29 ENCOUNTER — Encounter (HOSPITAL_COMMUNITY): Payer: Self-pay | Admitting: Cardiovascular Disease

## 2021-07-29 DIAGNOSIS — I080 Rheumatic disorders of both mitral and aortic valves: Secondary | ICD-10-CM | POA: Diagnosis not present

## 2021-07-29 DIAGNOSIS — Z7902 Long term (current) use of antithrombotics/antiplatelets: Secondary | ICD-10-CM | POA: Insufficient documentation

## 2021-07-29 DIAGNOSIS — I700 Atherosclerosis of aorta: Secondary | ICD-10-CM

## 2021-07-29 DIAGNOSIS — I340 Nonrheumatic mitral (valve) insufficiency: Secondary | ICD-10-CM | POA: Diagnosis not present

## 2021-07-29 DIAGNOSIS — I351 Nonrheumatic aortic (valve) insufficiency: Secondary | ICD-10-CM | POA: Diagnosis not present

## 2021-07-29 DIAGNOSIS — Z8673 Personal history of transient ischemic attack (TIA), and cerebral infarction without residual deficits: Secondary | ICD-10-CM | POA: Insufficient documentation

## 2021-07-29 DIAGNOSIS — Q2112 Patent foramen ovale: Secondary | ICD-10-CM

## 2021-07-29 DIAGNOSIS — E785 Hyperlipidemia, unspecified: Secondary | ICD-10-CM | POA: Diagnosis not present

## 2021-07-29 DIAGNOSIS — F17210 Nicotine dependence, cigarettes, uncomplicated: Secondary | ICD-10-CM | POA: Diagnosis not present

## 2021-07-29 HISTORY — PX: TEE WITHOUT CARDIOVERSION: SHX5443

## 2021-07-29 HISTORY — PX: BUBBLE STUDY: SHX6837

## 2021-07-29 SURGERY — ECHOCARDIOGRAM, TRANSESOPHAGEAL
Anesthesia: Monitor Anesthesia Care

## 2021-07-29 MED ORDER — BUTAMBEN-TETRACAINE-BENZOCAINE 2-2-14 % EX AERO
INHALATION_SPRAY | CUTANEOUS | Status: DC | PRN
  Administered 2021-07-29: 2 via TOPICAL

## 2021-07-29 MED ORDER — PHENYLEPHRINE HCL-NACL 20-0.9 MG/250ML-% IV SOLN
INTRAVENOUS | Status: DC | PRN
  Administered 2021-07-29: 15 ug/min via INTRAVENOUS

## 2021-07-29 MED ORDER — SODIUM CHLORIDE 0.9 % IV SOLN: INTRAVENOUS | Status: DC

## 2021-07-29 MED ORDER — PROPOFOL 500 MG/50ML IV EMUL
INTRAVENOUS | Status: DC | PRN
  Administered 2021-07-29: 100 ug/kg/min via INTRAVENOUS

## 2021-07-29 MED ORDER — PROPOFOL 10 MG/ML IV BOLUS
INTRAVENOUS | Status: DC | PRN
  Administered 2021-07-29: 60 mg via INTRAVENOUS

## 2021-07-29 MED ORDER — LIDOCAINE 2% (20 MG/ML) 5 ML SYRINGE
INTRAMUSCULAR | Status: DC | PRN
Start: 2021-07-29 — End: 2021-07-29
  Administered 2021-07-29: 100 mg via INTRAVENOUS

## 2021-07-29 NOTE — Transfer of Care (Signed)
Immediate Anesthesia Transfer of Care Note  Patient: [place]  Procedure(s) Performed: TRANSESOPHAGEAL ECHOCARDIOGRAM (TEE) BUBBLE STUDY  Patient Location: PACU and Endoscopy Unit  Anesthesia Type:MAC  Level of Consciousness: drowsy and patient cooperative  Airway & Oxygen Therapy: Patient Spontanous Breathing and Patient connected to nasal cannula oxygen  Post-op Assessment: Report given to RN and Post -op Vital signs reviewed and stable  Post vital signs: Reviewed and stable  Last Vitals:  Vitals Value Taken Time  BP 94/62   Temp    Pulse 61   Resp 16   SpO2 100     Last Pain:  Vitals:   07/29/21 1311  TempSrc: Temporal  PainSc: 5          Complications: No notable events documented.

## 2021-07-29 NOTE — Anesthesia Preprocedure Evaluation (Signed)
Anesthesia Evaluation  Patient identified by MRN, date of birth, ID band Patient awake    Reviewed: Allergy & Precautions, H&P , NPO status , Patient's Chart, lab work & pertinent test results  Airway Mallampati: II   Neck ROM: full    Dental   Pulmonary Current Smoker,    breath sounds clear to auscultation       Cardiovascular negative cardio ROS   Rhythm:regular Rate:Normal     Neuro/Psych TIACVA    GI/Hepatic   Endo/Other    Renal/GU Renal InsufficiencyRenal disease     Musculoskeletal   Abdominal   Peds  Hematology   Anesthesia Other Findings   Reproductive/Obstetrics                             Anesthesia Physical Anesthesia Plan  ASA: 3  Anesthesia Plan: MAC   Post-op Pain Management:    Induction: Intravenous  PONV Risk Score and Plan: Propofol infusion and Treatment may vary due to age or medical condition  Airway Management Planned: Nasal Cannula  Additional Equipment:   Intra-op Plan:   Post-operative Plan:   Informed Consent: I have reviewed the patients History and Physical, chart, labs and discussed the procedure including the risks, benefits and alternatives for the proposed anesthesia with the patient or authorized representative who has indicated his/her understanding and acceptance.     Dental advisory given  Plan Discussed with: CRNA, Anesthesiologist and Surgeon  Anesthesia Plan Comments:         Anesthesia Quick Evaluation

## 2021-07-29 NOTE — CV Procedure (Signed)
    Transesophageal Echocardiogram Note  [facility] [id number] 1951-04-15  Procedure: Transesophageal Echocardiogram Indications: strokes   Procedure Details Consent: Obtained Time Out: Verified patient identification, verified procedure, site/side was marked, verified correct patient position, special equipment/implants available, Radiology Safety Procedures followed,  medications/allergies/relevent history reviewed, required imaging and test results available.  Performed  Medications:  During this procedure the patient is lidocaine 100 mg IV followed by propofol drip - total of 329 mg IV for  sedation.  The patient's heart rate, blood pressure, and oxygen saturation are monitored continuously during the procedure. The period of conscious sedation is 45  minutes, of which I was present face-to-face 100% of this time.  Left Ventrical:  mildly reduced LV function .   EF 40-455  Mitral Valve: mild MR   Aortic Valve: mild-moderate AI   Tricuspid Valve: trace TR   Pulmonic Valve: whiff of PI   Left Atrium/ Left atrial appendage: no thrombi   Atrial septum: no PFO or ASD by color doppler or 2 bubble studies   Aorta: extensive athersclerosis , especially in the aortic arch.   There is a mobile plaque ( perhaps mobile thrombus) associated with the extensive plaque    Complications: No apparent complications Patient did tolerate procedure well.   [name], [name]., MD, [facility] 07/29/2021, 2:37 PM

## 2021-07-29 NOTE — Interval H&P Note (Signed)
History and Physical Interval Note:  07/29/2021 1:20 PM  [facility]  has presented today for surgery, with the diagnosis of PFO.  The various methods of treatment have been discussed with the patient and family. After consideration of risks, benefits and other options for treatment, the patient has consented to  Procedure(s): TRANSESOPHAGEAL ECHOCARDIOGRAM (TEE) (N/A) as a surgical intervention.  The patient's history has been reviewed, patient examined, no change in status, stable for surgery.  I have reviewed the patient's chart and labs.  Questions were answered to the patient's satisfaction.     Kristeen Miss

## 2021-07-29 NOTE — Discharge Instructions (Signed)

## 2021-07-29 NOTE — Anesthesia Procedure Notes (Signed)
Procedure Name: MAC Date/Time: 07/29/2021 1:59 PM Performed by: Lowella Dell, CRNA Pre-anesthesia Checklist: Patient identified, Emergency Drugs available, Suction available, Patient being monitored and Timeout performed Patient Re-evaluated:Patient Re-evaluated prior to induction Oxygen Delivery Method: Nasal cannula Placement Confirmation: positive ETCO2 Dental Injury: Teeth and Oropharynx as per pre-operative assessment

## 2021-07-29 NOTE — Progress Notes (Signed)
  Echocardiogram Echocardiogram Transesophageal has been performed.  [facility] 07/29/2021, 2:36 PM

## 2021-07-30 NOTE — Anesthesia Postprocedure Evaluation (Signed)
Anesthesia Post Note  Patient: [employer]  Procedure(s) Performed: TRANSESOPHAGEAL ECHOCARDIOGRAM (TEE) BUBBLE STUDY     Patient location during evaluation: PACU Anesthesia Type: MAC Level of consciousness: awake and alert Pain management: pain level controlled Vital Signs Assessment: post-procedure vital signs reviewed and stable Respiratory status: spontaneous breathing, nonlabored ventilation, respiratory function stable and patient connected to nasal cannula oxygen Cardiovascular status: blood pressure returned to baseline and stable Postop Assessment: no apparent nausea or vomiting Anesthetic complications: no   No notable events documented.  Last Vitals:  Vitals:   07/29/21 1445 07/29/21 1455  BP: 126/81 113/76  Pulse: 65 63  Resp: 13 (!) 23  Temp:    SpO2: 100% 95%    Last Pain:  Vitals:   07/29/21 1455  TempSrc:   PainSc: 0-No pain   Pain Goal:                   Alinna Siple S

## 2021-08-01 ENCOUNTER — Encounter (HOSPITAL_COMMUNITY): Payer: Self-pay | Admitting: Cardiovascular Disease

## 2021-09-09 ENCOUNTER — Encounter (HOSPITAL_COMMUNITY): Payer: Self-pay | Admitting: Emergency Medicine

## 2021-09-09 ENCOUNTER — Emergency Department (HOSPITAL_COMMUNITY)
Admission: EM | Admit: 2021-09-09 | Discharge: 2021-09-10 | Disposition: A | Discharge status: Home / Self Care | Payer: Medicare Other | Attending: Emergency Medicine | Admitting: Emergency Medicine

## 2021-09-09 ENCOUNTER — Other Ambulatory Visit: Payer: Self-pay

## 2021-09-09 DIAGNOSIS — R109 Unspecified abdominal pain: Secondary | ICD-10-CM | POA: Insufficient documentation

## 2021-09-09 DIAGNOSIS — I714 Abdominal aortic aneurysm, without rupture, unspecified: Secondary | ICD-10-CM | POA: Diagnosis not present

## 2021-09-09 DIAGNOSIS — F17210 Nicotine dependence, cigarettes, uncomplicated: Secondary | ICD-10-CM | POA: Diagnosis not present

## 2021-09-09 DIAGNOSIS — Z8673 Personal history of transient ischemic attack (TIA), and cerebral infarction without residual deficits: Secondary | ICD-10-CM | POA: Diagnosis not present

## 2021-09-09 DIAGNOSIS — R112 Nausea with vomiting, unspecified: Secondary | ICD-10-CM | POA: Insufficient documentation

## 2021-09-09 DIAGNOSIS — I7143 Infrarenal abdominal aortic aneurysm, without rupture: Secondary | ICD-10-CM

## 2021-09-09 MED ORDER — SODIUM CHLORIDE 0.9 % IV BOLUS
1000.0000 mL | Freq: Once | INTRAVENOUS | Status: AC
  Administered 2021-09-09: 1000 mL via INTRAVENOUS

## 2021-09-09 MED ORDER — METOCLOPRAMIDE HCL 5 MG/ML IJ SOLN
5.0000 mg | Freq: Once | INTRAMUSCULAR | Status: AC
  Administered 2021-09-09: 5 mg via INTRAVENOUS
  Filled 2021-09-09: qty 2

## 2021-09-09 MED ORDER — DIPHENHYDRAMINE HCL 50 MG/ML IJ SOLN
12.5000 mg | Freq: Once | INTRAMUSCULAR | Status: AC
  Administered 2021-09-09: 12.5 mg via INTRAVENOUS
  Filled 2021-09-09: qty 1

## 2021-09-09 NOTE — ED Notes (Signed)
Patient has not had is Plavix or atorvastatin or low dose ASA in over a month.

## 2021-09-09 NOTE — ED Triage Notes (Addendum)
Patient started having nausea and vomiting since 1930 with diaphoresis.  Patient denies any chest pain or shortness of breath.  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  Patient is CAOx4.  Patient given 4mg  zofran with no relief of the vomiting.

## 2021-09-09 NOTE — ED Provider Notes (Signed)
[facility] Provider Note   CSN: [id number] Arrival date & time: 09/09/21  2300     History  Chief Complaint  Patient presents with   Nausea   Emesis    Isaac Stokes is a 71 y.o. male.  71 yo M with a chief complaints of nausea and vomiting.  This started just about an hour ago.  He has a history of a stroke but denies any significant symptoms otherwise.  He was reportedly very sweaty and so he was brought immediately back to the room.  He had some chest pain yesterday evening but resolved after short period of time.  Was nonexertional.  Right-sided and radiated down the right arm.  Had some left flank pain yesterday morning.  He was given some nausea medicine and felt a little bit better but had a recurrent episode of vomiting.  He denies abdominal pain.  The history is provided by the patient.  Emesis Associated symptoms: no abdominal pain, no arthralgias, no chills, no diarrhea, no fever, no headaches and no myalgias   Illness Severity:  Moderate Onset quality:  Gradual Duration:  2 days Timing:  Constant Progression:  Worsening Chronicity:  New Associated symptoms: nausea and vomiting   Associated symptoms: no abdominal pain, no chest pain, no congestion, no diarrhea, no fever, no headaches, no myalgias, no rash and no shortness of breath       Home Medications Prior to Admission medications   Medication Sig Start Date End Date Taking? Authorizing Provider  atorvastatin (LIPITOR) 40 MG tablet Take 40 mg by mouth daily as needed. 06/16/21   [provider]  atorvastatin (LIPITOR) 80 MG tablet Take 1 tablet (80 mg total) by mouth daily. Patient not taking: Reported on 07/14/2021 03/30/21   Geradine Girt, DO  ciclopirox (PENLAC) 8 % solution Apply 1 application topically at bedtime. Apply over nail and surrounding skin. Apply daily over previous coat. After seven (7) days, may remove with alcohol and continue  cycle. Patient not taking: Reported on 07/14/2021    [provider]  clopidogrel (PLAVIX) 75 MG tablet Take 1 tablet (75 mg total) by mouth daily. 04/29/21   Marcial Pacas, MD      Allergies    Patient has no known allergies.    Review of Systems   Review of Systems  Constitutional:  Negative for chills and fever.  HENT:  Negative for congestion and facial swelling.   Eyes:  Negative for discharge and visual disturbance.  Respiratory:  Negative for shortness of breath.   Cardiovascular:  Negative for chest pain and palpitations.  Gastrointestinal:  Positive for nausea and vomiting. Negative for abdominal pain and diarrhea.  Musculoskeletal:  Negative for arthralgias and myalgias.  Skin:  Negative for color change and rash.  Neurological:  Negative for tremors, syncope and headaches.  Psychiatric/Behavioral:  Negative for confusion and dysphoric mood.    Physical Exam Updated Vital Signs BP 91/65    Pulse 73    Temp (!) 97.5 F (36.4 C) (Oral)    Resp 19    SpO2 95%  Physical Exam Vitals and nursing note reviewed.  Constitutional:      Appearance: He is well-developed.  HENT:     Head: Normocephalic and atraumatic.  Eyes:     Pupils: Pupils are equal, round, and reactive to light.  Neck:     Vascular: No JVD.  Cardiovascular:     Rate and Rhythm: Normal rate and regular rhythm.  Heart sounds: No murmur heard.   No friction rub. No gallop.  Pulmonary:     Effort: No respiratory distress.     Breath sounds: No wheezing.  Abdominal:     General: There is no distension.     Tenderness: There is no abdominal tenderness. There is no guarding or rebound.     Comments: Benign abdominal exam  Musculoskeletal:        General: Normal range of motion.     Cervical back: Normal range of motion and neck supple.  Skin:    Coloration: Skin is not pale.     Findings: No rash.  Neurological:     Mental Status: He is alert and oriented to person, place, and time.   Psychiatric:        Behavior: Behavior normal.    ED Results / Procedures / Treatments   Labs (all labs ordered are listed, but only abnormal results are displayed) Labs Reviewed  CBC WITH DIFFERENTIAL/PLATELET - Abnormal; Notable for the following components:      Result Value   RBC 3.82 (*)    Hemoglobin 11.9 (*)    HCT 35.9 (*)    All other components within normal limits  COMPREHENSIVE METABOLIC PANEL - Abnormal; Notable for the following components:   Glucose, Bld 119 (*)    BUN 26 (*)    Creatinine, Ser 1.63 (*)    Calcium 8.7 (*)    Total Protein 6.3 (*)    Albumin 3.4 (*)    GFR, Estimated 44 (*)    All other components within normal limits  LIPASE, BLOOD  TROPONIN I (HIGH SENSITIVITY)    EKG EKG Interpretation  Date/Time:  Thursday September 09 2021 23:50:05 EST Ventricular Rate:  69 PR Interval:  182 QRS Duration: 108 QT Interval:  419 QTC Calculation: 449 R Axis:   -42 Text Interpretation: Sinus rhythm Left axis deviation Abnormal R-wave progression, early transition No significant change since last tracing Confirmed by Deno Etienne [phone]) on 09/09/2021 11:53:07 PM  Radiology CT ABDOMEN PELVIS W CONTRAST  Result Date: 09/10/2021 CLINICAL DATA:  Acute and nonlocalized abdominal pain. Nausea and vomiting EXAM: CT ABDOMEN AND PELVIS WITH CONTRAST TECHNIQUE: Multidetector CT imaging of the abdomen and pelvis was performed using the standard protocol following bolus administration of intravenous contrast. RADIATION DOSE REDUCTION: This exam was performed according to the departmental dose-optimization program which includes automated exposure control, adjustment of the mA and/or kV according to patient size and/or use of iterative reconstruction technique. CONTRAST:  57mL OMNIPAQUE IOHEXOL 300 MG/ML  SOLN COMPARISON:  None. FINDINGS: Lower chest:  Atelectasis at the lung bases. Hepatobiliary: No focal liver abnormality.No evidence of biliary obstruction or stone.  Pancreas: Unremarkable. Spleen: Small wedge of non enhancement at the upper pole of the spleen which has a vascular appearance Adrenals/Urinary Tract: Negative adrenals. Symmetric atrophic kidneys. No hydronephrosis. Unremarkable bladder. Stomach/Bowel:  No obstruction. No visible bowel inflammation. Vascular/Lymphatic: Advanced generalized atheromatous plaque affecting the aorta and branch vessels. High-grade narrowing at the proximal SMA, evaluation limited by contrast timing. Suspect high-grade bilateral renal artery stenosis as well. Moderate narrowing at the right iliac origin. Fusiform abdominal aortic aneurysm which is infrarenal with mural clot. Maximal sac diameter on oblique reformats is 6.6 cm. No vessel draping or visible discontinuity of intimal calcification. No mass or adenopathy. Reproductive:No pathologic findings. Other: No ascites or pneumoperitoneum. Musculoskeletal: No acute abnormalities. Generalized spinal degeneration with scoliosis. IMPRESSION: 1. Small hypoenhancing wedge in the subcapsular spleen that is  likely from infarct, age indeterminate. 2. Advanced atherosclerosis with high-grade SMA narrowing. 3. Fusiform abdominal aortic aneurysm measuring up to 6.6 cm. Recommend referral to a vascular specialist. This recommendation follows ACR consensus guidelines: White Paper of the ACR Incidental Findings Committee II on Vascular Findings. J Am Coll Radiol 2013; 10:789-794. Electronically Signed   By: Jorje Guild M.D.   On: 09/10/2021 04:41    Procedures Procedures    Medications Ordered in ED Medications  metoCLOPramide (REGLAN) injection 5 mg (5 mg Intravenous Given 09/09/21 2344)  diphenhydrAMINE (BENADRYL) injection 12.5 mg (12.5 mg Intravenous Given 09/09/21 2343)  sodium chloride 0.9 % bolus 1,000 mL (0 mLs Intravenous Stopped 09/10/21 0255)  metoCLOPramide (REGLAN) injection 5 mg (5 mg Intravenous Given 09/10/21 0155)  diphenhydrAMINE (BENADRYL) injection 12.5 mg (12.5 mg  Intravenous Given 09/10/21 0155)  droperidol (INAPSINE) 2.5 MG/ML injection 1.25 mg (1.25 mg Intravenous Given 09/10/21 0252)  diphenhydrAMINE (BENADRYL) injection 12.5 mg (12.5 mg Intravenous Given 09/10/21 0252)  iohexol (OMNIPAQUE) 300 MG/ML solution 80 mL (80 mLs Intravenous Contrast Given 09/10/21 0404)    ED Course/ Medical Decision Making/ A&P                           Medical Decision Making  71 yo M with a chief complaints of nausea and vomiting.  This started just prior to arrival.  Most likely viral.  Will obtain lab work to evaluate for hepatitis or pancreatitis though I am not sure that I will see a change in his lab work with just a couple hours of symptoms.  We will give a bolus of IV fluids antiemetics.  He had some chest pain yesterday that was transient resolved we will obtain a troponin.  His old records were reviewed his most recent admission to the hospital was for a stroke, was thought to have a PFO.  Is followed by cardiology.  Most recent cardiology visit was a TEE.  No PFO on study.   Patient with persistent emesis here.  Requiring 2 doses of Reglan 1 of Zofran we will give droperidol obtain a CT scan.  CT scan with multiple vascular findings, atherosclerotic disease mostly along the SMA.  6.6 cm AAA.  I discussed this with vascular surgery Dr. Virl Cagey, will come evaluate at bedside.  Signed out to Dr. Ashok Cordia, please see his note for further details of care in the ED>   The patients results and plan were reviewed and discussed.   Any x-rays performed were independently reviewed by myself.   Differential diagnosis were considered with the presenting HPI.  Medications  metoCLOPramide (REGLAN) injection 5 mg (5 mg Intravenous Given 09/09/21 2344)  diphenhydrAMINE (BENADRYL) injection 12.5 mg (12.5 mg Intravenous Given 09/09/21 2343)  sodium chloride 0.9 % bolus 1,000 mL (0 mLs Intravenous Stopped 09/10/21 0255)  metoCLOPramide (REGLAN) injection 5 mg (5 mg Intravenous  Given 09/10/21 0155)  diphenhydrAMINE (BENADRYL) injection 12.5 mg (12.5 mg Intravenous Given 09/10/21 0155)  droperidol (INAPSINE) 2.5 MG/ML injection 1.25 mg (1.25 mg Intravenous Given 09/10/21 0252)  diphenhydrAMINE (BENADRYL) injection 12.5 mg (12.5 mg Intravenous Given 09/10/21 0252)  iohexol (OMNIPAQUE) 300 MG/ML solution 80 mL (80 mLs Intravenous Contrast Given 09/10/21 0404)    Vitals:   09/10/21 0330 09/10/21 0345 09/10/21 0612 09/10/21 0615  BP: 129/79 119/88 118/71 91/65  Pulse: 70 70 68 73  Resp: 20 13 17 19   Temp:      TempSrc:      SpO2:  97% 94% 96% 95%    Final diagnoses:  Nausea and vomiting in adult           Final Clinical Impression(s) / ED Diagnoses Final diagnoses:  Nausea and vomiting in adult    Rx / DC Orders ED Discharge Orders     None         Deno Etienne, DO 09/10/21 [id number]

## 2021-09-10 ENCOUNTER — Emergency Department (HOSPITAL_COMMUNITY): Payer: Medicare Other

## 2021-09-10 DIAGNOSIS — I714 Abdominal aortic aneurysm, without rupture, unspecified: Secondary | ICD-10-CM

## 2021-09-10 DIAGNOSIS — Z8673 Personal history of transient ischemic attack (TIA), and cerebral infarction without residual deficits: Secondary | ICD-10-CM

## 2021-09-10 DIAGNOSIS — F17210 Nicotine dependence, cigarettes, uncomplicated: Secondary | ICD-10-CM

## 2021-09-10 DIAGNOSIS — R112 Nausea with vomiting, unspecified: Secondary | ICD-10-CM | POA: Diagnosis not present

## 2021-09-10 LAB — CBC WITH DIFFERENTIAL/PLATELET
Abs Immature Granulocytes: 0.05 10*3/uL (ref 0.00–0.07)
Basophils Absolute: 0.1 10*3/uL (ref 0.0–0.1)
Basophils Relative: 1 %
Eosinophils Absolute: 0.1 10*3/uL (ref 0.0–0.5)
Eosinophils Relative: 1 %
HCT: 35.9 % — ABNORMAL LOW (ref 39.0–52.0)
Hemoglobin: 11.9 g/dL — ABNORMAL LOW (ref 13.0–17.0)
Immature Granulocytes: 1 %
Lymphocytes Relative: 19 %
Lymphs Abs: 1.8 10*3/uL (ref 0.7–4.0)
MCH: 31.2 pg (ref 26.0–34.0)
MCHC: 33.1 g/dL (ref 30.0–36.0)
MCV: 94 fL (ref 80.0–100.0)
Monocytes Absolute: 0.7 10*3/uL (ref 0.1–1.0)
Monocytes Relative: 7 %
Neutro Abs: 6.7 10*3/uL (ref 1.7–7.7)
Neutrophils Relative %: 71 %
Platelets: 195 10*3/uL (ref 150–400)
RBC: 3.82 MIL/uL — ABNORMAL LOW (ref 4.22–5.81)
RDW: 12.5 % (ref 11.5–15.5)
WBC: 9.4 10*3/uL (ref 4.0–10.5)
nRBC: 0 % (ref 0.0–0.2)

## 2021-09-10 LAB — COMPREHENSIVE METABOLIC PANEL
ALT: 11 U/L (ref 0–44)
AST: 17 U/L (ref 15–41)
Albumin: 3.4 g/dL — ABNORMAL LOW (ref 3.5–5.0)
Alkaline Phosphatase: 51 U/L (ref 38–126)
Anion gap: 10 (ref 5–15)
BUN: 26 mg/dL — ABNORMAL HIGH (ref 8–23)
CO2: 24 mmol/L (ref 22–32)
Calcium: 8.7 mg/dL — ABNORMAL LOW (ref 8.9–10.3)
Chloride: 104 mmol/L (ref 98–111)
Creatinine, Ser: 1.63 mg/dL — ABNORMAL HIGH (ref 0.61–1.24)
GFR, Estimated: 44 mL/min — ABNORMAL LOW (ref >60)
Glucose, Bld: 119 mg/dL — ABNORMAL HIGH (ref 70–99)
Potassium: 3.9 mmol/L (ref 3.5–5.1)
Sodium: 138 mmol/L (ref 135–145)
Total Bilirubin: 0.5 mg/dL (ref 0.3–1.2)
Total Protein: 6.3 g/dL — ABNORMAL LOW (ref 6.5–8.1)

## 2021-09-10 LAB — LIPASE, BLOOD: Lipase: 41 U/L (ref 11–51)

## 2021-09-10 LAB — TROPONIN I (HIGH SENSITIVITY): Troponin I (High Sensitivity): 5 ng/L (ref <18)

## 2021-09-10 IMAGING — CT CT ABD-PELV W/ CM
2 of 5 series · 15 of 46 positions shown, 17 images · IV contrast (APPLIED)
Comparison: None.

CLINICAL DATA: Acute and nonlocalized abdominal pain. Nausea and
vomiting

EXAM:
CT ABDOMEN AND PELVIS WITH CONTRAST
TECHNIQUE: Multidetector CT imaging of the abdomen and pelvis was performed
using the standard protocol following bolus administration of
intravenous contrast.

[Series 3: abdomen 5.0 · axial · 0.73mm/px · z∈[-473,-68]mm · 12 of 93 slices shown, 14 images]
[im 6/93  soft-tissue]
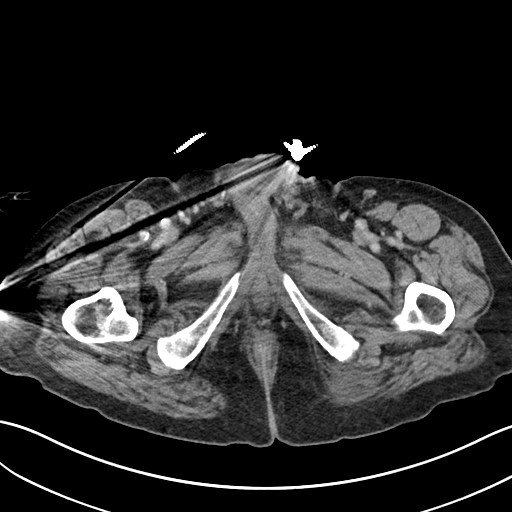
[im 6/93  bone]
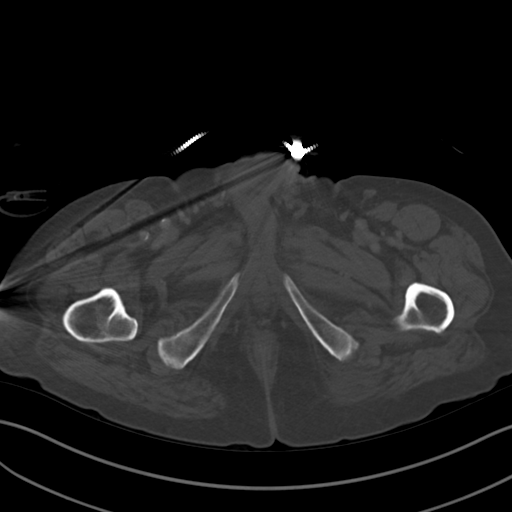
[im 17/93  soft-tissue]
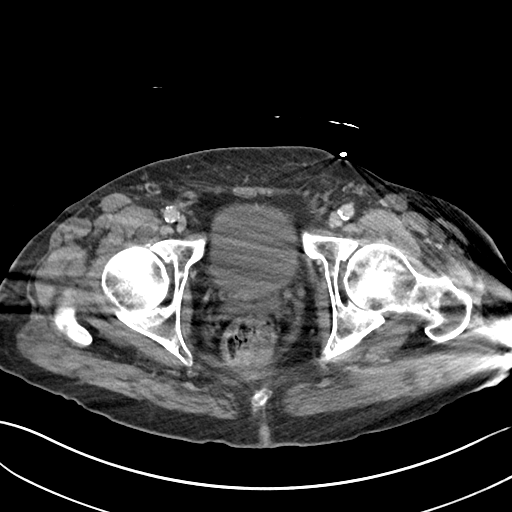
[im 22/93  soft-tissue]
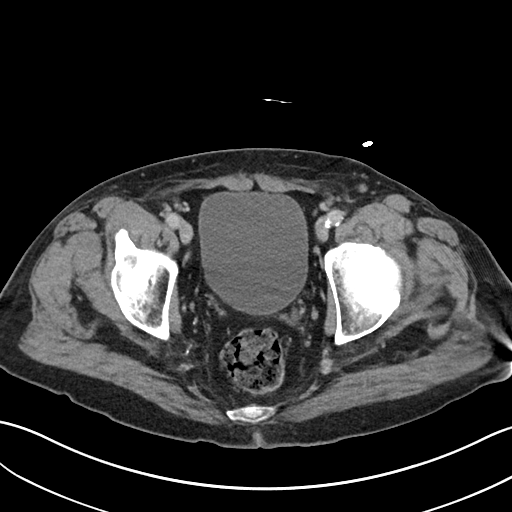
[im 28/93  soft-tissue]
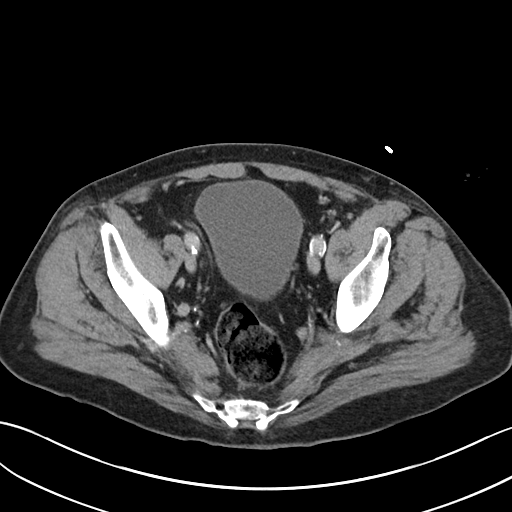
[im 38/93  soft-tissue]
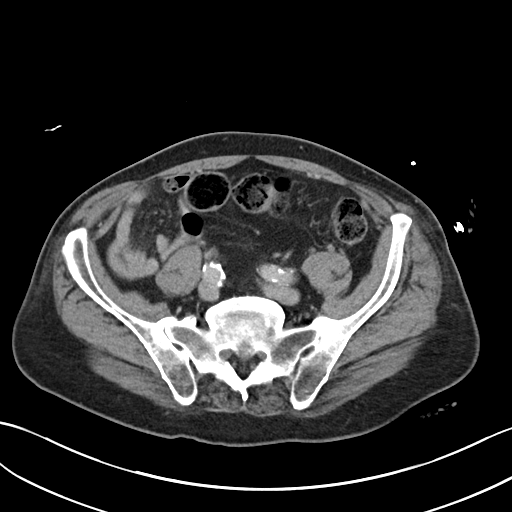
[im 44/93  soft-tissue]
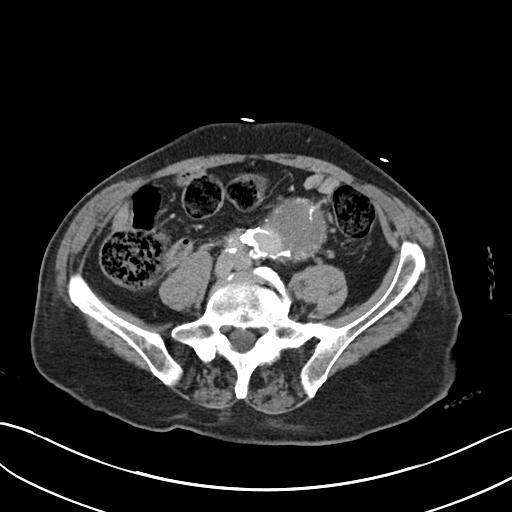
[im 49/93  soft-tissue]
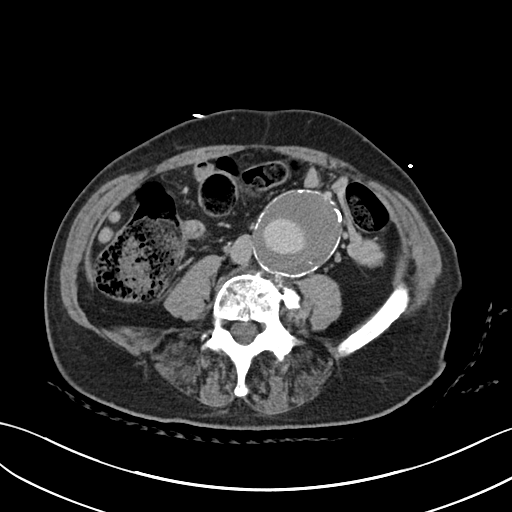
[im 60/93  soft-tissue]
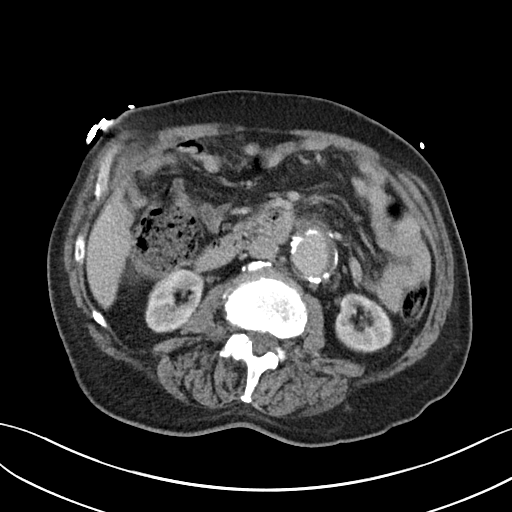
[im 65/93  soft-tissue]
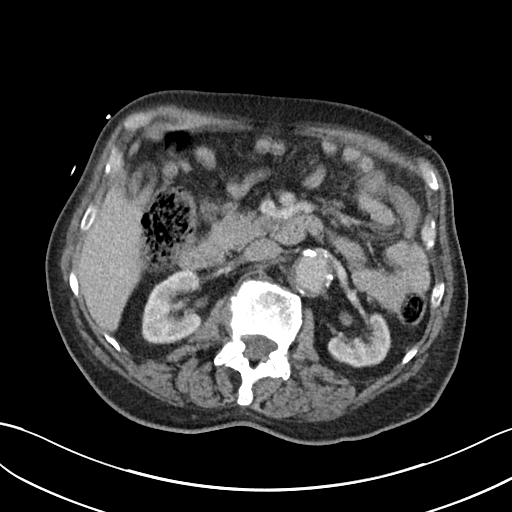
[im 65/93  bone]
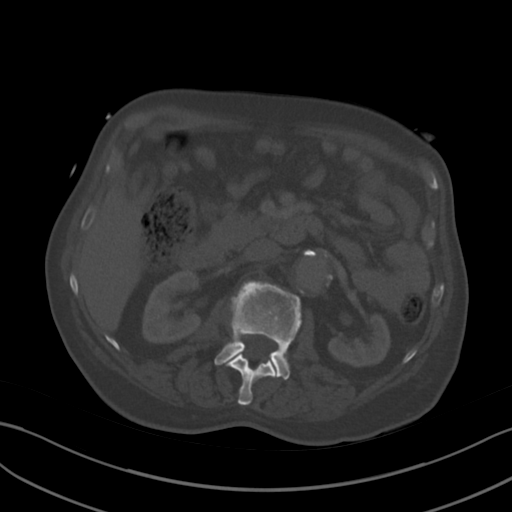
[im 71/93  soft-tissue]
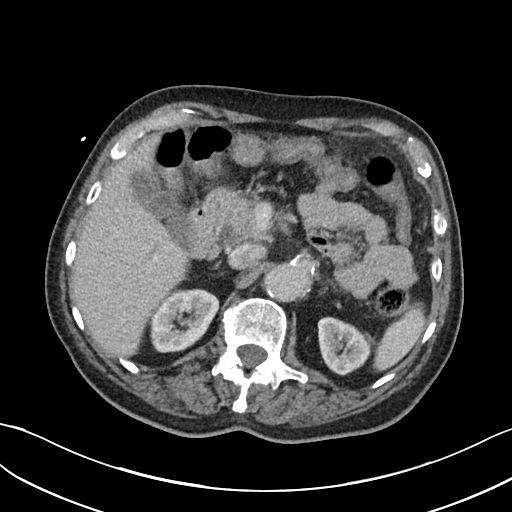
[im 82/93  soft-tissue]
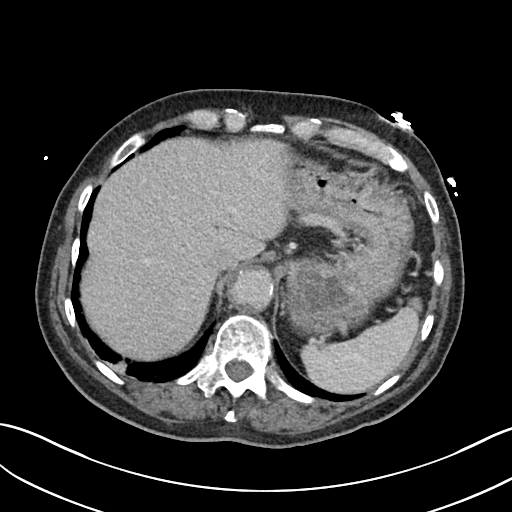
[im 87/93  soft-tissue]
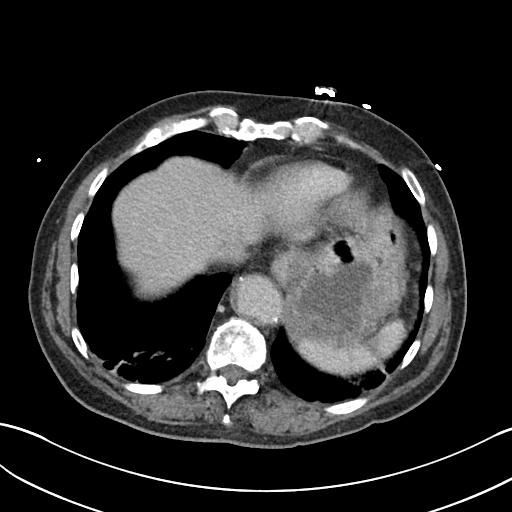

[Series 6: abdomen 3.0 mpr cor · coronal · 0.66mm/px · 3 of 92 slices shown]
[im 31/92  soft-tissue]
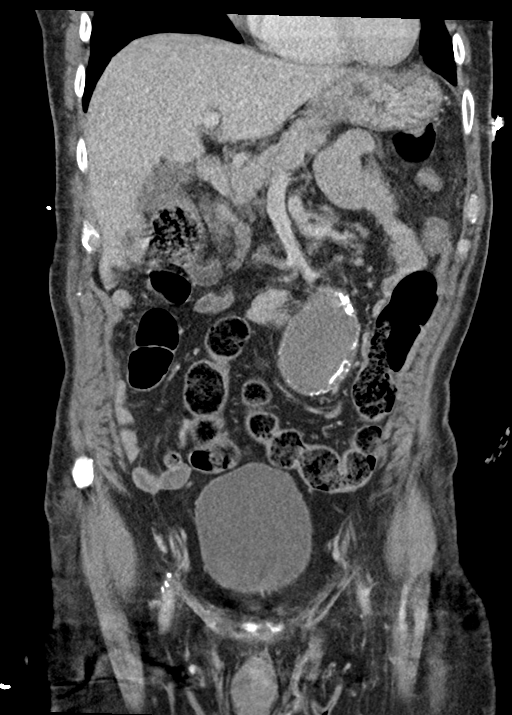
[im 41/92  soft-tissue]
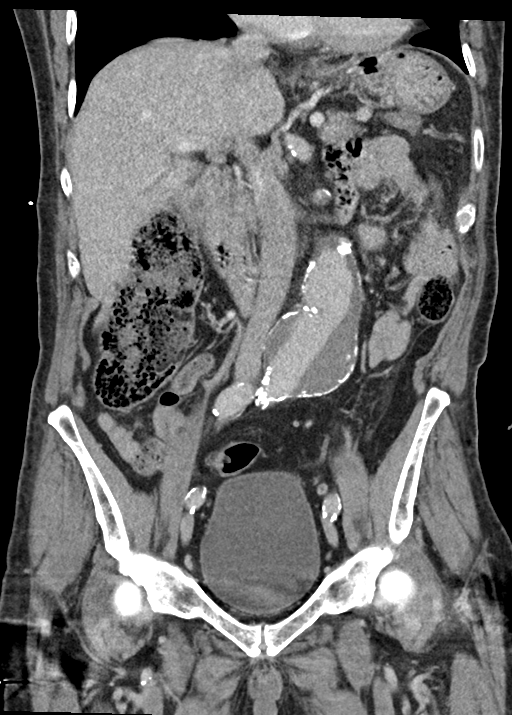
[im 51/92  soft-tissue]
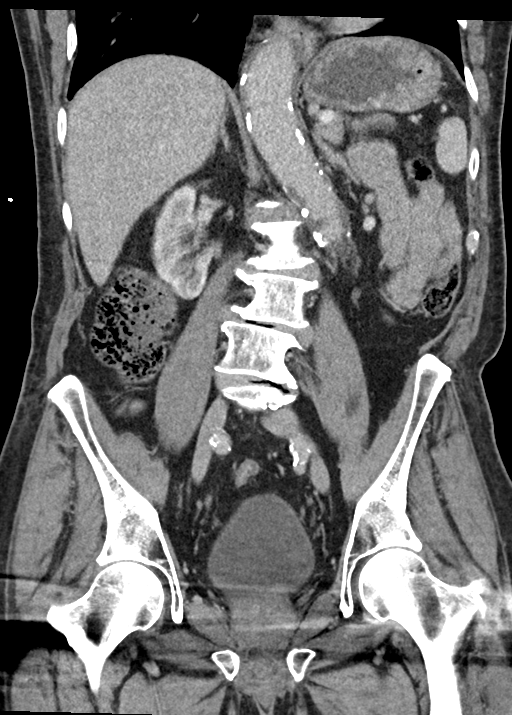

[15 of 46 positions shown; findings below may reference images not displayed]

RADIATION DOSE REDUCTION: This exam was performed according to the
departmental dose-optimization program which includes automated
exposure control, adjustment of the mA and/or kV according to
patient size and/or use of iterative reconstruction technique.

CONTRAST:  80mL OMNIPAQUE IOHEXOL 300 MG/ML  SOLN
FINDINGS: Lower chest:  Atelectasis at the lung bases.

Hepatobiliary: No focal liver abnormality.No evidence of biliary
obstruction or stone.

Pancreas: Unremarkable.

Spleen: Small wedge of non enhancement at the upper pole of the
spleen which has a vascular appearance

Adrenals/Urinary Tract: Negative adrenals. Symmetric atrophic
kidneys. No hydronephrosis. Unremarkable bladder.

Stomach/Bowel:  No obstruction. No visible bowel inflammation.

Vascular/Lymphatic: Advanced generalized atheromatous plaque
affecting the aorta and branch vessels. High-grade narrowing at the
proximal SMA, evaluation limited by contrast timing. Suspect
high-grade bilateral renal artery stenosis as well. Moderate
narrowing at the right iliac origin. Fusiform abdominal aortic
aneurysm which is infrarenal with mural clot. Maximal sac diameter
on oblique reformats is 6.6 cm. No vessel draping or visible
discontinuity of intimal calcification. No mass or adenopathy.

Reproductive:No pathologic findings.

Other: No ascites or pneumoperitoneum.

Musculoskeletal: No acute abnormalities. Generalized spinal
degeneration with scoliosis.
IMPRESSION: 1. Small hypoenhancing wedge in the subcapsular spleen that is
likely from infarct, age indeterminate.
2. Advanced atherosclerosis with high-grade SMA narrowing.
3. Fusiform abdominal aortic aneurysm measuring up to 6.6 cm.
Recommend referral to a vascular specialist. This recommendation
follows ACR consensus guidelines: White Paper of the ACR Incidental

## 2021-09-10 MED ORDER — METOCLOPRAMIDE HCL 5 MG/ML IJ SOLN
5.0000 mg | Freq: Once | INTRAMUSCULAR | Status: AC
  Administered 2021-09-10: 5 mg via INTRAVENOUS
  Filled 2021-09-10: qty 2

## 2021-09-10 MED ORDER — DIPHENHYDRAMINE HCL 50 MG/ML IJ SOLN
12.5000 mg | Freq: Once | INTRAMUSCULAR | Status: AC
  Administered 2021-09-10: 12.5 mg via INTRAVENOUS
  Filled 2021-09-10: qty 1

## 2021-09-10 MED ORDER — DROPERIDOL 2.5 MG/ML IJ SOLN
1.2500 mg | Freq: Once | INTRAMUSCULAR | Status: AC
  Administered 2021-09-10: 1.25 mg via INTRAVENOUS
  Filled 2021-09-10: qty 2

## 2021-09-10 MED ORDER — IOHEXOL 300 MG/ML  SOLN
80.0000 mL | Freq: Once | INTRAMUSCULAR | Status: AC | PRN
  Administered 2021-09-10: 80 mL via INTRAVENOUS

## 2021-09-10 NOTE — Discharge Instructions (Addendum)
It was our pleasure to provide your ER care today - we hope that you feel better.  Drink plenty of fluids/stay well hydrated.   Your imaging tests show a 6.6 cm abdominal aortic aneurysm - our vascular surgeon indicates for you to follow up with him in the office in the next 1-2 weeks to discuss possible procedure to treat it.  Prior to that procedure, you will also need medical clearance from your cardiologist - call office today to arrange appointment in the next 1-2 weeks.   Return to ER right away if worse, new symptoms, fevers, persistent vomiting, abdominal/flank pain, chest pain, trouble breathing, or other concern.

## 2021-09-10 NOTE — ED Provider Notes (Signed)
Signed out that patient with nausea/vomiting which have improved, incidentally noted w large AAA, and that vascular was going to eval, to d/c to home after eval.   Pt alert, content, no acute distress. No pain. Abd soft non tender.   Po fluids/food provided.   Vascular has seen and rec outpatient f/u with them and cardiology - followed by possible endovascular repair.      Lajean Saver, MD 09/10/21 [phone]

## 2021-09-10 NOTE — Consult Note (Signed)
Hospital Consult   Reason for Consult:  Asymptomatic AAA Requesting Physician: Dr. Deno Etienne MRN #:  [id number]  History of Present Illness: This is a 71 y.o. male who presented to the ED overnight with complaints of nausea and vomiting.  Patient currently lives in assisted living after having a stroke. At the time of presentation, patient denied abdominal pain.  He did note chest pain with some radiation down his right side that lasted for short period of time.  A CTA was ordered, which demonstrated asymptomatic 6.3 cm abdominal aortic aneurysm. Vascular surgery was consulted for further recommendations.  On exam, Howard was resting comfortably.  His nausea and vomiting had resolved.  He was unaware of his abdominal aortic aneurysm.  Taff has poor memory since his stroke.  He said he relies on his brother for some of his healthcare decision-making.  Past Medical History:  Diagnosis Date   TIA (transient ischemic attack) 03/25/2021   3 this summer of 2022    Past Surgical History:  Procedure Laterality Date   BUBBLE STUDY  07/29/2021   Procedure: BUBBLE STUDY;  Surgeon: Thayer Headings, MD;  Location: [place];  Service: Cardiovascular;;   TEE WITHOUT CARDIOVERSION N/A 07/29/2021   Procedure: TRANSESOPHAGEAL ECHOCARDIOGRAM (TEE);  Surgeon: Acie Fredrickson Wonda Cheng, MD;  Location: [facility] ENDOSCOPY;  Service: Cardiovascular;  Laterality: N/A;    No Known Allergies  Prior to Admission medications   Medication Sig Start Date End Date Taking? Authorizing Provider  atorvastatin (LIPITOR) 40 MG tablet Take 40 mg by mouth daily as needed. 06/16/21   [provider]  atorvastatin (LIPITOR) 80 MG tablet Take 1 tablet (80 mg total) by mouth daily. Patient not taking: Reported on 07/14/2021 03/30/21   Geradine Girt, DO  ciclopirox (PENLAC) 8 % solution Apply 1 application topically at bedtime. Apply over nail and surrounding skin. Apply daily over previous coat. After seven (7) days, may  remove with alcohol and continue cycle. Patient not taking: Reported on 07/14/2021    [provider]  clopidogrel (PLAVIX) 75 MG tablet Take 1 tablet (75 mg total) by mouth daily. 04/29/21   Marcial Pacas, MD    Social History   Socioeconomic History   Marital status: Single    Spouse name: Not on file   Number of children: Not on file   Years of education: Not on file   Highest education level: Not on file  Occupational History   Not on file  Tobacco Use   Smoking status: Light Smoker    Packs/day: 0.25    Types: Cigarettes   Smokeless tobacco: Never  Substance and Sexual Activity   Alcohol use: Never   Drug use: Never   Sexual activity: Not on file  Other Topics Concern   Not on file  Social History Narrative   Lives alone in [address]   Right Handed   Drinks 2-3 cups caffeine daily   Social Determinants of Health   Financial Resource Strain: Not on file  Food Insecurity: Not on file  Transportation Needs: Not on file  Physical Activity: Not on file  Stress: Not on file  Social Connections: Not on file  Intimate Partner Violence: Not on file   No family history on file.  ROS: Otherwise negative unless mentioned in HPI  Physical Examination  Vitals:   09/10/21 0700 09/10/21 0715  BP: 93/66 102/66  Pulse: 67 66  Resp: 11 17  Temp:    SpO2: 96% 95%   There is  no height or weight on file to calculate BMI.  General:  WDWN in NAD Gait: Not observed HENT: WNL, normocephalic Pulmonary: normal non-labored breathing, without Rales, rhonchi,  wheezing Cardiac: regular Abdomen: soft, NT/ND, no masses Skin: without rashes Vascular Exam/Pulses: 2+ femoral pulses bilaterally Extremities: without ischemic changes, without Gangrene , without cellulitis; without open wounds;  Musculoskeletal: no muscle wasting or atrophy  Neurologic: A&O X 3;  No focal weakness or paresthesias are detected; speech is fluent/normal Psychiatric:  The pt has Normal  affect. Lymph:  Unremarkable  CBC    Component Value Date/Time   WBC 9.4 09/09/2021 2325   RBC 3.82 (L) 09/09/2021 2325   HGB 11.9 (L) 09/09/2021 2325   HGB 12.7 (L) 07/26/2021 1344   HCT 35.9 (L) 09/09/2021 2325   HCT 39.7 07/26/2021 1344   PLT 195 09/09/2021 2325   PLT 201 07/26/2021 1344   MCV 94.0 09/09/2021 2325   MCV 93 07/26/2021 1344   MCH 31.2 09/09/2021 2325   MCHC 33.1 09/09/2021 2325   RDW 12.5 09/09/2021 2325   RDW 12.0 07/26/2021 1344   LYMPHSABS 1.8 09/09/2021 2325   LYMPHSABS 1.6 07/26/2021 1344   MONOABS 0.7 09/09/2021 2325   EOSABS 0.1 09/09/2021 2325   EOSABS 0.1 07/26/2021 1344   BASOSABS 0.1 09/09/2021 2325   BASOSABS 0.1 07/26/2021 1344    BMET    Component Value Date/Time   NA 138 09/09/2021 2325   NA 141 07/28/2021 1350   K 3.9 09/09/2021 2325   CL 104 09/09/2021 2325   CO2 24 09/09/2021 2325   GLUCOSE 119 (H) 09/09/2021 2325   BUN 26 (H) 09/09/2021 2325   BUN 26 07/28/2021 1350   CREATININE 1.63 (H) 09/09/2021 2325   CALCIUM 8.7 (L) 09/09/2021 2325   GFRNONAA 44 (L) 09/09/2021 2325    COAGS: Lab Results  Component Value Date   INR 1.2 03/26/2021   INR 1.1 01/26/2021     Non-Invasive Vascular Imaging:   CT angio abdomen pelvis demonstrates 6.3 cm abdominal aortic aneurysm.  This appears to be amenable to endovascular aortic repair   ASSESSMENT/PLAN: This is a 71 y.o. male who presented to the ED with acute onset nausea and vomiting.  This is since resolved.  He has no abdominal pain, no back pain, no chest pain.  Imaging demonstrates 6.3 cm abdominal aortic aneurysm.  This appears to be amenable to endovascular aortic repair.  Nekoda has minimal memory difficulties due to his stroke, and currently lives in assisted living.  He relies on his brother for some of his healthcare decision-making.  I had a long discussion with him regarding abdominal aortic aneurysm, and the signs and symptoms of rupture. I quoted Andoni a 10 to 20% chance of  rupture in the next year with his current size. Ardell would be best treated with endovascular aortic repair.  His brother was not present, therefore I will call him and discussed the above.  Xyler would benefit from cardiology evaluation for preoperative risk stratification.   I will have my office reach out to his cardiologist to set an appointment.   He will also have an outpatient appointment with me prior to repair to discuss this further with he and his brother.  Kylan is aware of the signs and symptoms of rupture, and knows to call 911 immediately should any of the symptoms occur.  Cassandria Santee MD MS [facility] [phone] 09/10/2021  7:59 AM

## 2021-09-17 ENCOUNTER — Telehealth: Payer: Self-pay

## 2021-09-17 NOTE — Telephone Encounter (Signed)
Pharmacy calling requesting clarification on medication atorvastatin. Pt has 2 Rx on file. Which strength is pt supposed to be taken? Please address

## 2021-09-17 NOTE — Telephone Encounter (Signed)
Called pt's pharmacy to inform them that the pt needed to contact PCP for any refills for atorvastatin. Pharmacist verbalized understanding.

## 2021-09-20 ENCOUNTER — Other Ambulatory Visit: Payer: Self-pay

## 2021-09-20 ENCOUNTER — Encounter: Payer: Self-pay | Admitting: Cardiovascular Disease

## 2021-09-20 ENCOUNTER — Ambulatory Visit (INDEPENDENT_AMBULATORY_CARE_PROVIDER_SITE_OTHER): Payer: Medicare Other | Admitting: Cardiovascular Disease

## 2021-09-20 VITALS — BP 134/80 | HR 75 | Ht 72.0 in | Wt 177.0 lb

## 2021-09-20 DIAGNOSIS — E782 Mixed hyperlipidemia: Secondary | ICD-10-CM

## 2021-09-20 DIAGNOSIS — I7143 Infrarenal abdominal aortic aneurysm, without rupture: Secondary | ICD-10-CM | POA: Diagnosis not present

## 2021-09-20 DIAGNOSIS — I700 Atherosclerosis of aorta: Secondary | ICD-10-CM

## 2021-09-20 DIAGNOSIS — Z01810 Encounter for preprocedural cardiovascular examination: Secondary | ICD-10-CM | POA: Diagnosis not present

## 2021-09-20 DIAGNOSIS — Z720 Tobacco use: Secondary | ICD-10-CM

## 2021-09-20 NOTE — Patient Instructions (Signed)
Medication Instructions:  Your physician recommends that you continue on your current medications as directed. Please refer to the Current Medication list given to you today.  *If you need a refill on your cardiac medications before your next appointment, please call your pharmacy*   Lab Work: NONE If you have labs (blood work) drawn today and your tests are completely normal, you will receive your results only by: MyChart Message (if you have MyChart) OR A paper copy in the mail If you have any lab test that is abnormal or we need to change your treatment, we will call you to review the results.   Testing/Procedures: **Clear for surgery**   Follow-Up: At [facility], you and your health needs are our priority.  As part of our continuing mission to provide you with exceptional heart care, we have created designated Provider Care Teams.  These Care Teams include your primary Cardiologist (physician) and Advanced Practice Providers (APPs -  Physician Assistants and Nurse Practitioners) who all work together to provide you with the care you need, when you need it.  We recommend signing up for the patient portal called "MyChart".  Sign up information is provided on this After Visit Summary.  MyChart is used to connect with patients for Virtual Visits (Telemedicine).  Patients are able to view lab/test results, encounter notes, upcoming appointments, etc.  Non-urgent messages can be sent to your provider as well.   To learn more about what you can do with MyChart, go to [email or web address].    Your next appointment:   1 year(s)  The format for your next appointment:   In Person  Provider:   Tonny Bollman

## 2021-09-20 NOTE — Progress Notes (Signed)
Cardiology Office Note:    Date:  09/21/2021   ID:  Isaac Stokes, DOB November 25, 1950, MRN [id number]  PCP:  Roselee Nova, MD   [place] Providers Cardiologist:  Sherren Mocha, MD     Referring MD: Roselee Nova, MD   Chief Complaint  Patient presents with   Pre-op Exam    History of Present Illness:    Isaac Stokes is a 71 y.o. male with a hx of  recurrent strokes.  He initially reported stroke about 14 years ago.  He had another event in May 2022 and has residual left leg numbness related to this.  A third event happened March 26, 2021 when he had sudden onset of dizziness and some expressive aphasia.  Symptoms resolved fairly quickly.  An MRI of the brain showed a 3 mm acute ischemic right temporal infarct and additional possible tiny acute ischemic infarcts in the posterior right frontoparietal lobes.  The patient was not found to have any major large vessel atherosclerotic disease during his evaluation.  A limited echo bubble study showed a small right to left shunt and the patient is referred today for further evaluation of PFO. He had a follow-up TEE  demonstrating no significant PFO, but he was noted to have heavy atheromatous plaque in the thoracic aorta. He has had no recurrent neurologic symptoms.  He is a longtime heavy smoker, used to smoke over 2 packs/day.   He recently was seen in the ER for nausea and vomiting and was found to have a large AAA in need of repair. The patient was seen by Dr Virl Cagey who feels the aneurysm can be treated with EVAR. He presents today for cardiac clearance for surgery. The patient denies any cardiac symptoms at present and specifically denies chest pain or pressure at rest or with activity. No shortness of breath, syncope, or palpitations.     Past Medical History:  Diagnosis Date   TIA (transient ischemic attack) 03/25/2021   3 this summer of 2022    Past Surgical History:  Procedure Laterality Date   BUBBLE STUDY   07/29/2021   Procedure: BUBBLE STUDY;  Surgeon: Thayer Headings, MD;  Location: [place];  Service: Cardiovascular;;   TEE WITHOUT CARDIOVERSION N/A 07/29/2021   Procedure: TRANSESOPHAGEAL ECHOCARDIOGRAM (TEE);  Surgeon: Acie Fredrickson Wonda Cheng, MD;  Location[facility] ENDOSCOPY;  Service: Cardiovascular;  Laterality: N/A;    Current Medications: Current Meds  Medication Sig   atorvastatin (LIPITOR) 40 MG tablet Take 1 tablet by mouth daily.   ciclopirox (PENLAC) 8 % solution Apply 1 application topically at bedtime. Apply over nail and surrounding skin. Apply daily over previous coat. After seven (7) days, may remove with alcohol and continue cycle.   clopidogrel (PLAVIX) 75 MG tablet Take 1 tablet (75 mg total) by mouth daily.     Allergies:   Patient has no known allergies.   Social History   Socioeconomic History   Marital status: Single    Spouse name: Not on file   Number of children: Not on file   Years of education: Not on file   Highest education level: Not on file  Occupational History   Not on file  Tobacco Use   Smoking status: Light Smoker    Packs/day: 0.25    Types: Cigarettes   Smokeless tobacco: Never  Substance and Sexual Activity   Alcohol use: Never   Drug use: Never   Sexual activity: Not on file  Other Topics  Concern   Not on file  Social History Narrative   Lives alone in [address]   Right Handed   Drinks 2-3 cups caffeine daily   Social Determinants of Health   Financial Resource Strain: Not on file  Food Insecurity: Not on file  Transportation Needs: Not on file  Physical Activity: Not on file  Stress: Not on file  Social Connections: Not on file     Family History: The patient's family history is not on file.  ROS:   Please see the history of present illness.    All other systems reviewed and are negative.  EKGs/Labs/Other Studies Reviewed:    The following studies were reviewed today: CTA Abdomen/Pelvis: IMPRESSION: 1. Small  hypoenhancing wedge in the subcapsular spleen that is likely from infarct, age indeterminate. 2. Advanced atherosclerosis with high-grade SMA narrowing. 3. Fusiform abdominal aortic aneurysm measuring up to 6.6 cm. Recommend referral to a vascular specialist.   Echo TEE:  1. Left ventricular ejection fraction, by estimation, is 40 to 45%. The  left ventricle has mildly decreased function. The left ventricle  demonstrates global hypokinesis.   2. Right ventricular systolic function is normal. The right ventricular  size is normal.   3. No left atrial/left atrial appendage thrombus was detected.   4. The mitral valve is grossly normal. Mild to moderate mitral valve  regurgitation.   5. The aortic valve is normal in structure. Aortic valve regurgitation is  mild to moderate. No aortic stenosis is present.   6. There is significant atheromatous plaque with evicence of mobile  plaque or perhaps associated thrombus. . There is Severe, mobile (Grade V)  plaque.   Echo 01/27/2021: 1. Left ventricular ejection fraction, by estimation, is 55%. The left  ventricle has normal function. The left ventricle has no regional wall  motion abnormalities. Left ventricular diastolic parameters are consistent  with Grade I diastolic dysfunction  (impaired relaxation).   2. Right ventricular systolic function is normal. The right ventricular  size is normal. Tricuspid regurgitation signal is inadequate for assessing  PA pressure.   3. The mitral valve is normal in structure. Mild mitral valve  regurgitation. No evidence of mitral stenosis.   4. The aortic valve is tricuspid. Aortic valve regurgitation is mild. No  aortic stenosis is present.   5. Aortic dilatation noted. There is mild dilatation of the aortic root,  measuring 40 mm.   6. The inferior vena cava is normal in size with greater than 50%  respiratory variability, suggesting right atrial pressure of 3 mmHg.   EKG:  EKG is not ordered today.   The ekg from 09/09/21 shows normal sinus rhythm with left axis deviation, and no significant ST-T changes  Recent Labs: 01/28/2021: Magnesium 2.1 09/09/2021: ALT 11; BUN 26; Creatinine, Ser 1.63; Hemoglobin 11.9; Platelets 195; Potassium 3.9; Sodium 138  Recent Lipid Panel    Component Value Date/Time   CHOL 234 (H) 01/27/2021 0310   TRIG 140 01/27/2021 0310   HDL 40 (L) 01/27/2021 0310   CHOLHDL 5.9 01/27/2021 0310   VLDL 28 01/27/2021 0310   LDLCALC 166 (H) 01/27/2021 0310     Risk Assessment/Calculations:           Physical Exam:    VS:  BP 134/80    Pulse 75    Ht 6' (1.829 m)    Wt 177 lb (80.3 kg)    SpO2 98%    BMI 24.01 kg/m     Wt Readings from  Last 3 Encounters:  09/20/21 177 lb (80.3 kg)  07/29/21 180 lb (81.6 kg)  07/14/21 182 lb 6.4 oz (82.7 kg)     GEN:  Well nourished, well developed in no acute distress HEENT: Normal NECK: No JVD; No carotid bruits LYMPHATICS: No lymphadenopathy CARDIAC: RRR, no murmurs, rubs, gallops RESPIRATORY:  Clear to auscultation without rales, wheezing or rhonchi  ABDOMEN: Soft, non-tender, non-distended MUSCULOSKELETAL:  No edema; No deformity  SKIN: Warm and dry NEUROLOGIC:  Alert and oriented x 3 PSYCHIATRIC:  Normal affect   ASSESSMENT:    1. Aortic atherosclerosis (HCC)   2. Infrarenal abdominal aortic aneurysm (AAA) without rupture   3. Mixed hyperlipidemia   4. Tobacco abuse   5. Preop cardiovascular exam    PLAN:    In order of problems listed above:  The patient has heavy aortic atherosclerosis seen on TEE and has AAA recently discovered on CTA study. He is on atorvastatin and clopidogrel at present.  Plans for EVAR noted. I do not think the patient is at excessive cardiac risk of anesthesia. He denies symptoms of angina, arrhythmia, or heart failure. EKG is nonischemic. Ok to proceed at low cardiac risk.  Treated with atorvastatin 40 mg daily.  Cessation counseling done. He is working on cutting back. As  above, no further testing indicated. I reviewed his echo, TEE, and CTA studies. At low cardiac risk of EVAR/anesthesia.            Medication Adjustments/Labs and Tests Ordered: Current medicines are reviewed at length with the patient today.  Concerns regarding medicines are outlined above.  No orders of the defined types were placed in this encounter.  No orders of the defined types were placed in this encounter.   Patient Instructions  Medication Instructions:  Your physician recommends that you continue on your current medications as directed. Please refer to the Current Medication list given to you today.  *If you need a refill on your cardiac medications before your next appointment, please call your pharmacy*   Lab Work: NONE If you have labs (blood work) drawn today and your tests are completely normal, you will receive your results only by: MyChart Message (if you have MyChart) OR A paper copy in the mail If you have any lab test that is abnormal or we need to change your treatment, we will call you to review the results.   Testing/Procedures: **Clear for surgery**   Follow-Up: At [facility], you and your health needs are our priority.  As part of our continuing mission to provide you with exceptional heart care, we have created designated Provider Care Teams.  These Care Teams include your primary Cardiologist (physician) and Advanced Practice Providers (APPs -  Physician Assistants and Nurse Practitioners) who all work together to provide you with the care you need, when you need it.  We recommend signing up for the patient portal called "MyChart".  Sign up information is provided on this After Visit Summary.  MyChart is used to connect with patients for Virtual Visits (Telemedicine).  Patients are able to view lab/test results, encounter notes, upcoming appointments, etc.  Non-urgent messages can be sent to your provider as well.   To learn more about what you can  do with MyChart, go to [email or web address].    Your next appointment:   1 year(s)  The format for your next appointment:   In Person  Provider:   Richard Miu, MD  09/21/2021 6:17  AM    [place]

## 2021-09-22 NOTE — Progress Notes (Deleted)
Office Note     CC:  Asymptomatic 6.6cm AAA Requesting Provider:  Ellyn Hack, MD  HPI: Isaac Stokes is a 71 y.o. ([birth year]) male presenting at the request of .Ellyn Hack, MD ***  The pt is *** on a statin for cholesterol management.  The pt is *** on a daily aspirin.   Other AC:  *** The pt is *** on medication for hypertension.   The pt is *** diabetic.  Tobacco hx:  ***  Past Medical History:  Diagnosis Date   TIA (transient ischemic attack) 03/25/2021   3 this summer of 2022    Past Surgical History:  Procedure Laterality Date   BUBBLE STUDY  07/29/2021   Procedure: BUBBLE STUDY;  Surgeon: [name], MD;  [facility] ENDOSCOPY;  Service: Cardiovascular;;   TEE WITHOUT CARDIOVERSION N/A 07/29/2021   Procedure: TRANSESOPHAGEAL ECHOCARDIOGRAM (TEE);  Surgeon: Elease Hashimoto, Deloris Ping, MD;  Location: [facility] ENDOSCOPY;  Service: Cardiovascular;  Laterality: N/A;    Social History   Socioeconomic History   Marital status: Single    Spouse name: Not on file   Number of children: Not on file   Years of education: Not on file   Highest education level: Not on file  Occupational History   Not on file  Tobacco Use   Smoking status: Light Smoker    Packs/day: 0.25    Types: Cigarettes   Smokeless tobacco: Never  Substance and Sexual Activity   Alcohol use: Never   Drug use: Never   Sexual activity: Not on file  Other Topics Concern   Not on file  Social History Narrative   Lives alone in [address]   Right Handed   Drinks 2-3 cups caffeine daily   Social Determinants of Health   Financial Resource Strain: Not on file  Food Insecurity: Not on file  Transportation Needs: Not on file  Physical Activity: Not on file  Stress: Not on file  Social Connections: Not on file  Intimate Partner Violence: Not on file   ***No family history on file.  Current Outpatient Medications  Medication Sig Dispense Refill   atorvastatin (LIPITOR) 40 MG tablet  Take 40 mg by mouth daily as needed. (Patient not taking: Reported on 09/20/2021)     atorvastatin (LIPITOR) 40 MG tablet Take 1 tablet by mouth daily.     atorvastatin (LIPITOR) 80 MG tablet Take 1 tablet (80 mg total) by mouth daily. (Patient not taking: Reported on 09/20/2021) 30 tablet 0   ciclopirox (PENLAC) 8 % solution Apply 1 application topically at bedtime. Apply over nail and surrounding skin. Apply daily over previous coat. After seven (7) days, may remove with alcohol and continue cycle.     clopidogrel (PLAVIX) 75 MG tablet Take 1 tablet (75 mg total) by mouth daily. 90 tablet 4   No current facility-administered medications for this visit.    No Known Allergies   REVIEW OF SYSTEMS:  *** [X]  denotes positive finding, [ ]  denotes negative finding Cardiac  Comments:  Chest pain or chest pressure:    Shortness of breath upon exertion:    Short of breath when lying flat:    Irregular heart rhythm:        Vascular    Pain in calf, thigh, or hip brought on by ambulation:    Pain in feet at night that wakes you up from your sleep:     Blood clot in your veins:    Leg  swelling:         Pulmonary    Oxygen at home:    Productive cough:     Wheezing:         Neurologic    Sudden weakness in arms or legs:     Sudden numbness in arms or legs:     Sudden onset of difficulty speaking or slurred speech:    Temporary loss of vision in one eye:     Problems with dizziness:         Gastrointestinal    Blood in stool:     Vomited blood:         Genitourinary    Burning when urinating:     Blood in urine:        Psychiatric    Major depression:         Hematologic    Bleeding problems:    Problems with blood clotting too easily:        Skin    Rashes or ulcers:        Constitutional    Fever or chills:      PHYSICAL EXAMINATION:  There were no vitals filed for this visit.  General:  WDWN in NAD; vital signs documented above Gait: Not observed HENT: WNL,  normocephalic Pulmonary: normal non-labored breathing , without wheezing Cardiac: {Desc; regular/irreg:14544} HR, bruit*** Abdomen: soft, NT, no masses Skin: {With/Without:20273} rashes Vascular Exam/Pulses:  Right Left  Radial {Exam; arterial pulse strength 0-4:30167} {Exam; arterial pulse strength 0-4:30167}  Ulnar {Exam; arterial pulse strength 0-4:30167} {Exam; arterial pulse strength 0-4:30167}  Femoral {Exam; arterial pulse strength 0-4:30167} {Exam; arterial pulse strength 0-4:30167}  Popliteal {Exam; arterial pulse strength 0-4:30167} {Exam; arterial pulse strength 0-4:30167}  DP {Exam; arterial pulse strength 0-4:30167} {Exam; arterial pulse strength 0-4:30167}  PT {Exam; arterial pulse strength 0-4:30167} {Exam; arterial pulse strength 0-4:30167}   Extremities: {With/Without:20273} ischemic changes, {With/Without:20273} Gangrene , {With/Without:20273} cellulitis; {With/Without:20273} open wounds;  Musculoskeletal: no muscle wasting or atrophy  Neurologic: A&O X 3;  No focal weakness or paresthesias are detected Psychiatric:  The pt has {Desc; normal/abnormal:11317::"Normal"} affect.   Non-Invasive Vascular Imaging:   ***    ASSESSMENT/PLAN: Isaac Stokes is a 71 y.o. male presenting with ***   ***   Victorino Sparrow, MD [facility] [phone]

## 2021-09-24 ENCOUNTER — Other Ambulatory Visit: Payer: Self-pay

## 2021-09-24 ENCOUNTER — Encounter: Payer: Self-pay | Admitting: Vascular Surgery

## 2021-09-24 ENCOUNTER — Ambulatory Visit (INDEPENDENT_AMBULATORY_CARE_PROVIDER_SITE_OTHER): Payer: Medicare Other | Admitting: Vascular Surgery

## 2021-09-24 VITALS — BP 131/80 | HR 81 | Temp 98.2°F | Resp 20 | Ht 72.0 in | Wt 177.0 lb

## 2021-09-24 DIAGNOSIS — I7143 Infrarenal abdominal aortic aneurysm, without rupture: Secondary | ICD-10-CM | POA: Diagnosis not present

## 2021-09-24 NOTE — Progress Notes (Signed)
Office Note     CC:  Asymptomatic 6.6cm AAA Requesting Provider:  Ellyn HackShah, Syed Asad A, MD  HPI: Isaac Stokes is a 71 y.o. (09/19/1950) male presenting at the request of .Ellyn HackShah, Syed Asad A, MD in follow-up status post recent CT visit for nausea and vomiting.  CT with contrast at that time demonstrated 6.6 cm abdominal aortic aneurysm.  Isaac Stokes lives independently, but he likes to keep his brother updated on his medical decisions.  After my initial consultation with him in the emergency department, I called his brother to discuss the results above.  On exam today, Isaac Stokes remains asymptomatic.  He denies chest pain, back pain, abdominal pain.  He denies history of claudication, rest pain, tissue loss in his lower extremities.  Isaac Stokes also denies history of postprandial pain, weight loss, uncontrolled hypertension, flash pulmonary edema.  He does have chronic kidney disease with last creatinine being 1.6~  The pt is  on a statin for cholesterol management.  The pt is not on a daily aspirin.   Other AC:  plavix The pt is not on medication for hypertension.   The pt is not diabetic.  Tobacco hx:  yes  Past Medical History:  Diagnosis Date   TIA (transient ischemic attack) 03/25/2021   3 this summer of 2022    Past Surgical History:  Procedure Laterality Date   BUBBLE STUDY  07/29/2021   Procedure: BUBBLE STUDY;  Surgeon: Vesta MixerNahser, Philip J, MD;  [facility];  Service: Cardiovascular;;   TEE WITHOUT CARDIOVERSION N/A 07/29/2021   Procedure: TRANSESOPHAGEAL ECHOCARDIOGRAM (TEE);  Surgeon: Elease HashimotoNahser, Deloris PingPhilip J, MD;  [facility];  Service: Cardiovascular;  Laterality: N/A;    Social History   Socioeconomic History   Marital status: Single    Spouse name: Not on file   Number of children: Not on file   Years of education: Not on file   Highest education level: Not on file  Occupational History   Not on file  Tobacco Use   Smoking status: Light Smoker    Packs/day: 0.25     Types: Cigarettes   Smokeless tobacco: Never  Substance and Sexual Activity   Alcohol use: Never   Drug use: Never   Sexual activity: Not on file  Other Topics Concern   Not on file  Social History Narrative   Lives alone in [address]   Right Handed   Drinks 2-3 cups caffeine daily   Social Determinants of Health   Financial Resource Strain: Not on file  Food Insecurity: Not on file  Transportation Needs: Not on file  Physical Activity: Not on file  Stress: Not on file  Social Connections: Not on file  Intimate Partner Violence: Not on file   No family history on file.  Current Outpatient Medications  Medication Sig Dispense Refill   atorvastatin (LIPITOR) 40 MG tablet Take 40 mg by mouth daily as needed. (Patient not taking: Reported on 09/20/2021)     atorvastatin (LIPITOR) 40 MG tablet Take 1 tablet by mouth daily.     atorvastatin (LIPITOR) 80 MG tablet Take 1 tablet (80 mg total) by mouth daily. (Patient not taking: Reported on 09/20/2021) 30 tablet 0   ciclopirox (PENLAC) 8 % solution Apply 1 application topically at bedtime. Apply over nail and surrounding skin. Apply daily over previous coat. After seven (7) days, may remove with alcohol and continue cycle.     clopidogrel (PLAVIX) 75 MG tablet Take 1 tablet (75 mg total) by mouth  daily. 90 tablet 4   No current facility-administered medications for this visit.    No Known Allergies   REVIEW OF SYSTEMS:   [X]  denotes positive finding, [ ]  denotes negative finding Cardiac  Comments:  Chest pain or chest pressure:    Shortness of breath upon exertion:    Short of breath when lying flat:    Irregular heart rhythm:        Vascular    Pain in calf, thigh, or hip brought on by ambulation:    Pain in feet at night that wakes you up from your sleep:     Blood clot in your veins:    Leg swelling:         Pulmonary    Oxygen at home:    Productive cough:     Wheezing:         Neurologic    Sudden  weakness in arms or legs:     Sudden numbness in arms or legs:     Sudden onset of difficulty speaking or slurred speech:    Temporary loss of vision in one eye:     Problems with dizziness:         Gastrointestinal    Blood in stool:     Vomited blood:         Genitourinary    Burning when urinating:     Blood in urine:        Psychiatric    Major depression:         Hematologic    Bleeding problems:    Problems with blood clotting too easily:        Skin    Rashes or ulcers:        Constitutional    Fever or chills:      PHYSICAL EXAMINATION:  There were no vitals filed for this visit.  General:  WDWN in NAD; vital signs documented above Gait: Not observed HENT: WNL, normocephalic Pulmonary: normal non-labored breathing , without wheezing Cardiac: regular HR, Abdomen: soft, NT, no masses Skin: without rashes Vascular Exam/Pulses:  Right Left  Radial 2+ (normal) 2+ (normal)  Ulnar 2+ (normal) 2+ (normal)  Femoral 2+ (normal) 2+ (normal)  Popliteal    DP 2+ (normal) 2+ (normal)  PT     Extremities: without ischemic changes, without Gangrene , without cellulitis; without open wounds;  Musculoskeletal: no muscle wasting or atrophy  Neurologic: A&O X 3;  No focal weakness or paresthesias are detected Psychiatric:  The pt has Normal affect.    Non-Invasive Vascular Imaging:   CT abdomen pelvis with contrast on 09/10/2018 was reviewed demonstrating 6.6 cm infrarenal abdominal aortic aneurysm.  He has bilateral renal artery stenosis, significance of which I cannot ascertain.  There does not appear to be superior mesenteric artery stenosis from my interpretation, however this was noted by radiology.    ASSESSMENT/PLAN: Isaac Stokes is a 71 y.o. male presenting with asymptomatic 6.6 cm infrarenal abdominal aneurysm.  The study is a contrasted CT with poor resolution at the viscera.  He has no signs or symptoms of chronic mesenteric ischemia, uncontrolled  hypertension, but does have some renal insufficiency.   Due to some uncertainty surrounding possible basilar artery stenosis, Isaac Stokes would benefit from CT angio abdomen pelvis in an effort to further define the stenosis appreciated at the superior mesenteric artery as well as bilateral renal arteries.  This will also help to further define the aneurysm for preoperative planning.  I had  a long discussion with Isaac Stokes regarding the above.  He was amenable to new CT angio abdomen pelvis.  He was also okay with booking surgery pending the results of the study.  I will call him with the results of the study.  By was educated on the signs and symptoms of rupture.  He was asked to call 911 immediately should any of these occur.  He was also asked to continue his current medication regimen.   Isaac Sparrow, MD [facility] [phone]

## 2021-09-27 ENCOUNTER — Other Ambulatory Visit: Payer: Self-pay

## 2021-09-27 DIAGNOSIS — I7143 Infrarenal abdominal aortic aneurysm, without rupture: Secondary | ICD-10-CM

## 2021-09-30 ENCOUNTER — Inpatient Hospital Stay: Admission: RE | Admit: 2021-09-30 | Payer: Medicare Other | Source: Ambulatory Visit

## 2021-09-30 ENCOUNTER — Ambulatory Visit
Admission: RE | Admit: 2021-09-30 | Discharge: 2021-09-30 | Disposition: A | Discharge status: Home / Self Care | Payer: Medicare Other | Source: Ambulatory Visit | Attending: Vascular Surgery | Admitting: Vascular Surgery

## 2021-09-30 DIAGNOSIS — I7143 Infrarenal abdominal aortic aneurysm, without rupture: Secondary | ICD-10-CM

## 2021-09-30 IMAGING — CT CT CTA ABD/PEL W/CM AND/OR W/O CM
2 of 7 series · 15 of 46 positions shown, 17 images · IV contrast (agent unspecified)
Comparison: [DATE]

CLINICAL DATA: Aortic aneurysm (AAA), surveillance

EXAM:
CTA ABDOMEN AND PELVIS WITHOUT AND WITH CONTRAST
TECHNIQUE: Multidetector CT imaging of the abdomen and pelvis was performed
using the standard protocol during bolus administration of
intravenous contrast. Multiplanar reconstructed images and MIPs were
obtained and reviewed to evaluate the vascular anatomy.

[Series 6: cta arterial 2.00 bv36 s3 axial st · axial · arterial · 0.71mm/px · z∈[+1205,+1591]mm · 12 of 213 slices shown, 14 images]
[im 10/213  soft-tissue]
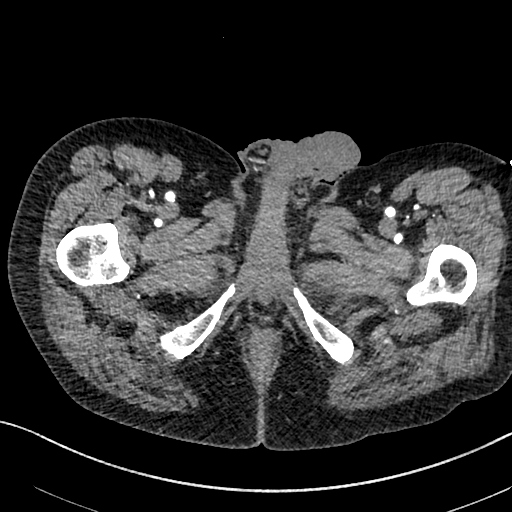
[im 10/213  bone]
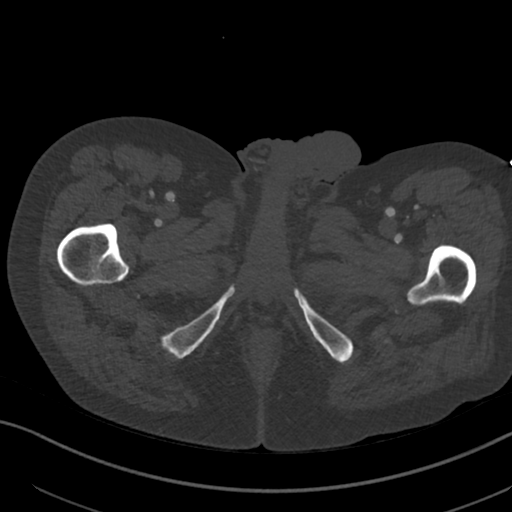
[im 28/213  soft-tissue]
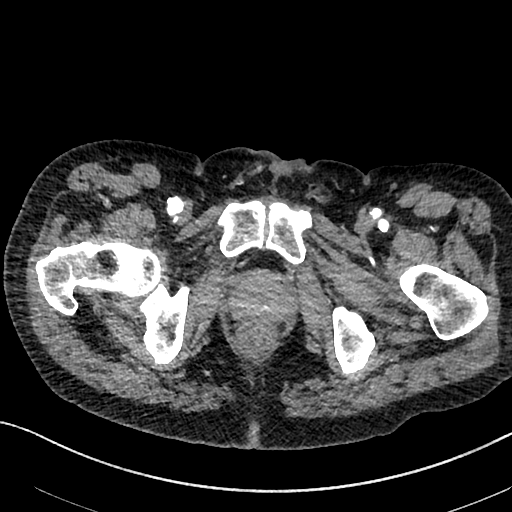
[im 47/213  soft-tissue]
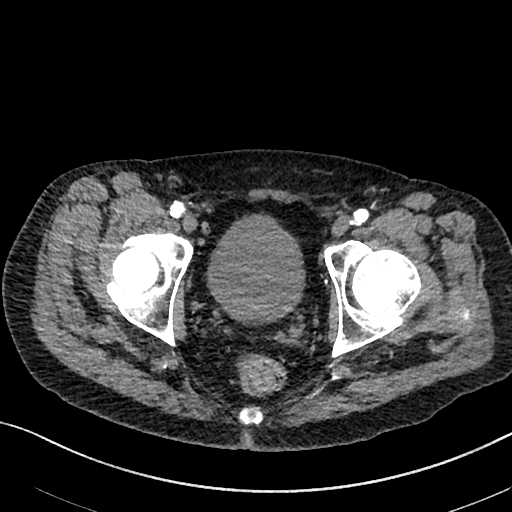
[im 65/213  soft-tissue]
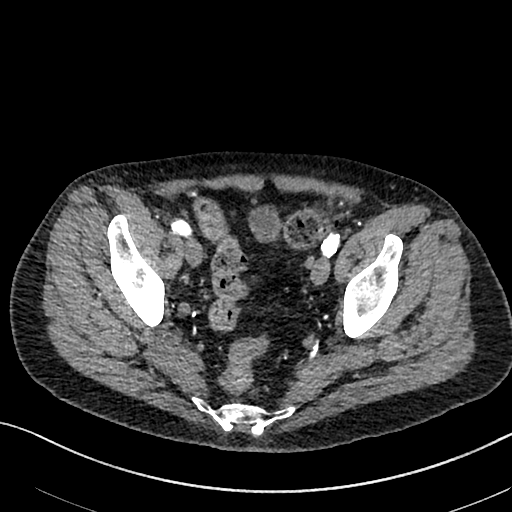
[im 83/213  soft-tissue]
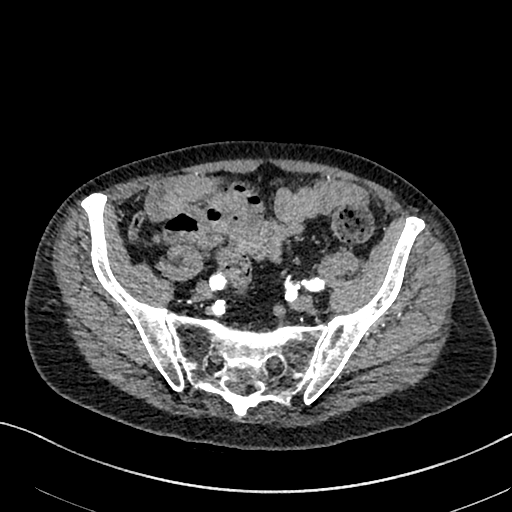
[im 102/213  soft-tissue]
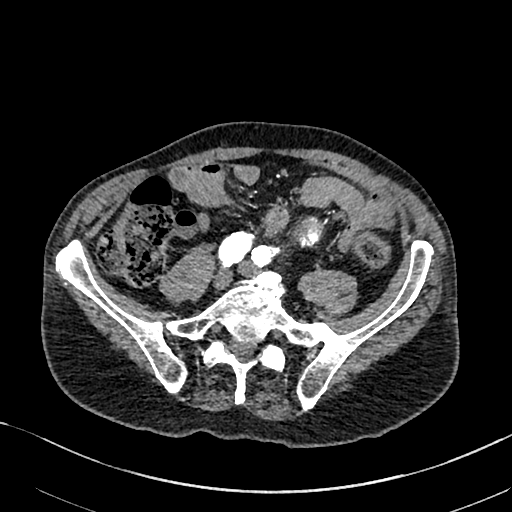
[im 111/213  soft-tissue]
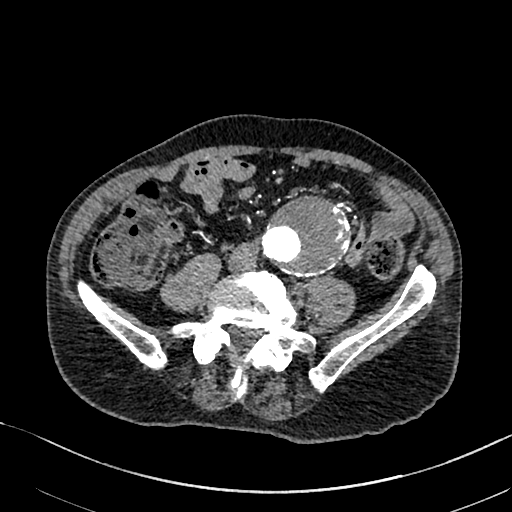
[im 130/213  soft-tissue]
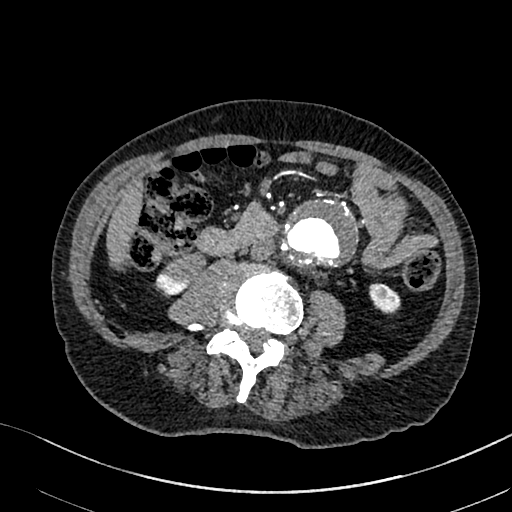
[im 148/213  soft-tissue]
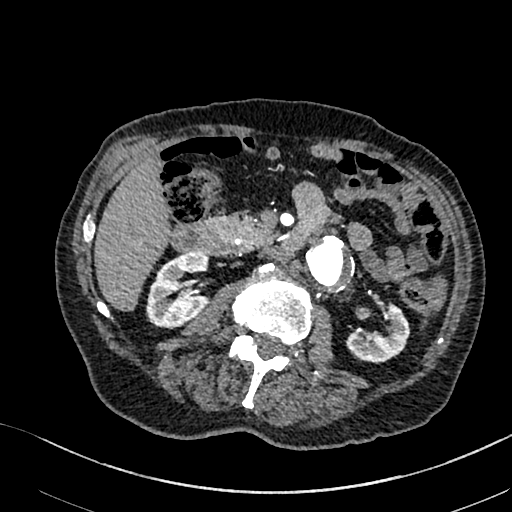
[im 148/213  bone]
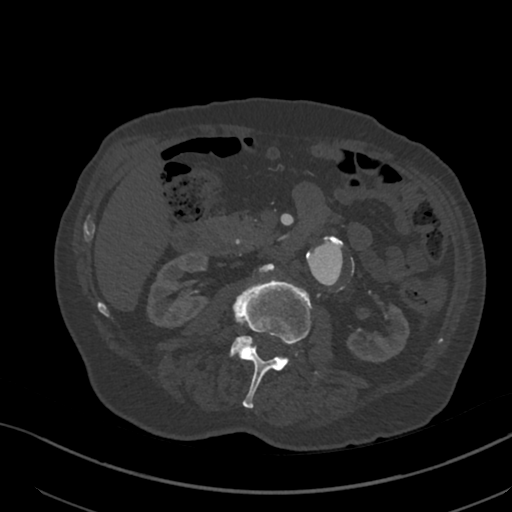
[im 166/213  soft-tissue]
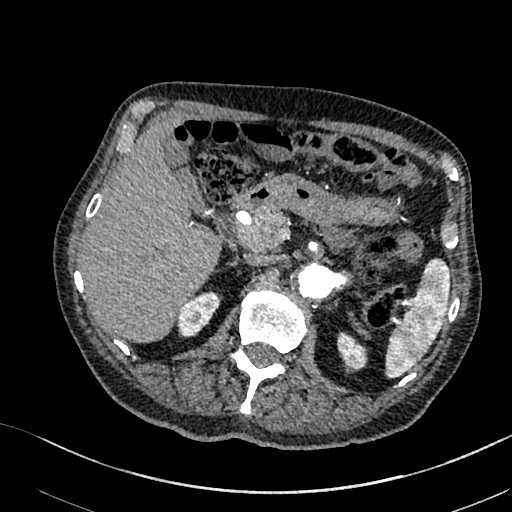
[im 185/213  soft-tissue]
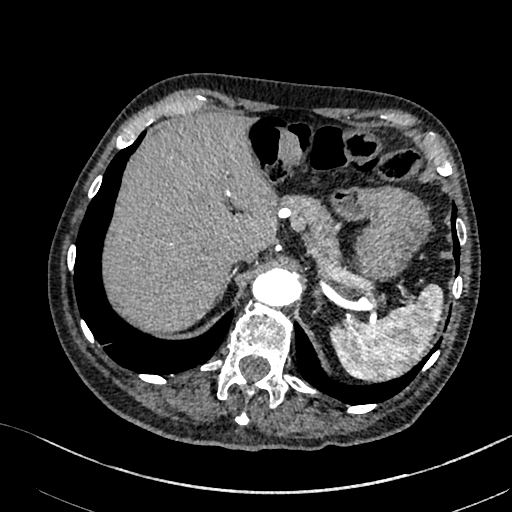
[im 203/213  soft-tissue]
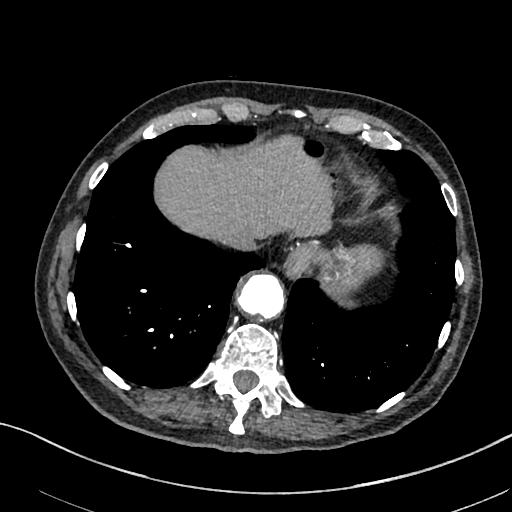

[Series 10: cta arterial 2.00 bv36 s3 cor art st · coronal · arterial · 0.73mm/px · 3 of 138 slices shown]
[im 35/138  soft-tissue]
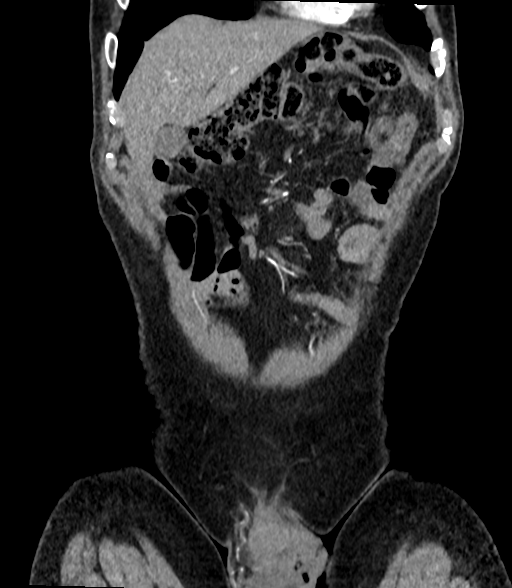
[im 69/138  soft-tissue]
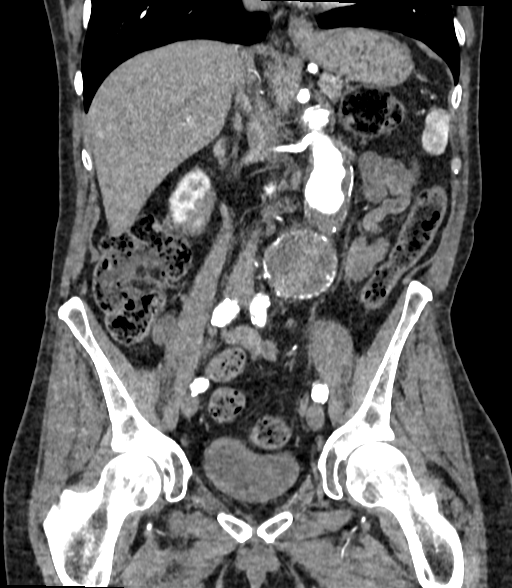
[im 103/138  soft-tissue]
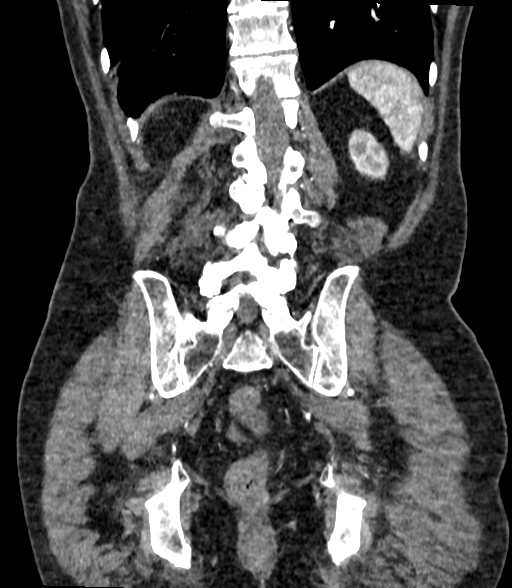

[15 of 46 positions shown; findings below may reference images not displayed]

RADIATION DOSE REDUCTION: This exam was performed according to the
departmental dose-optimization program which includes automated
exposure control, adjustment of the mA and/or kV according to
patient size and/or use of iterative reconstruction technique.

CONTRAST:  75mL [76] IOPAMIDOL ([76]) INJECTION 76%
FINDINGS: VASCULAR

Aorta: Again seen is the infrarenal abdominal aortic aneurysm which
measures up to 6.4 x 6.4 cm, stable previously up to 6.4 cm, and
demonstrating a similar degree of mural thrombus. There is diffuse
irregular fibrofatty plaque and calcific atherosclerotic disease.

Celiac: Patent without evidence of aneurysm, dissection, vasculitis
or significant stenosis.

SMA: Fibrofatty plaque results in severe stenosis of the proximal
SMA. The SMA is patent distally, appears to be co-supplied by
collateral pathways from a robust pancreaticoduodenal arcade.

IMA: Patent.

Renals: Single renal arteries bilaterally are patent. Calcific
atherosclerosis results in at least moderate ostial stenosis of the
right renal artery.

Inflow: Right common iliac artery aneurysm measures 1.8 cm. The
iliac and visualized femoral vessels are patent without significant
stenosis.

Veins: No obvious venous abnormality within the limitations of this
arterial phase study.

NON-VASCULAR

Inferior chest: The lung bases are well-aerated.

Hepatobiliary: The liver is normal in size. Small subcentimeter
arterial enhancing focus near the liver dome most likely benign
flash filling hemangioma. No intrahepatic or extrahepatic biliary
ductal dilation. The gallbladder appears normal.

Spleen: Normal in size without focal abnormality.

Pancreas: No pancreatic ductal dilatation or surrounding
inflammatory changes.

Adrenals/Urinary Tract: Adrenal glands are unremarkable. There is
new cortical hypoenhancement of the right renal lower pole. No mass
lesion of the kidneys. No hydronephrosis. Bladder is unremarkable.

Stomach/Bowel: The stomach, small bowel and large bowel are normal
in caliber without abnormal wall thickening or surrounding
inflammatory changes.

Reproductive: Prostate is unremarkable.

Lymphatic: No enlarged lymph nodes in the abdomen or pelvis.

Other: No abdominopelvic ascites.

Musculoskeletal: No aggressive osseous lesions. Multilevel
degenerative changes of the lumbar spine. The soft tissues are
unremarkable.
IMPRESSION: VASCULAR

1. Stable size and appearance of infrarenal abdominal aortic
aneurysm measuring 6.4 x 6.4 cm. Recommend referral to a vascular
specialist. This recommendation follows ACR consensus guidelines:
White Paper of the ACR Incidental Findings Committee II on Vascular
Findings. [HOSPITAL] [76]; [DATE].
2. Right common iliac artery aneurysm measuring 1.8 cm.
3. There is diffuse fibrofatty and atherosclerotic vascular disease
of the abdominal aorta and its visceral branches, which results in
severe stenosis of the proximal SMA, and at least moderate ostial
stenosis of the right renal artery.

NON-VASCULAR

1. There is a new hypoenhancing area of the right renal lower pole
compatible with infarct (see key image).

## 2021-09-30 MED ORDER — IOPAMIDOL (ISOVUE-370) INJECTION 76%
75.0000 mL | Freq: Once | INTRAVENOUS | Status: AC | PRN
  Administered 2021-09-30: 75 mL via INTRAVENOUS

## 2021-10-11 ENCOUNTER — Encounter: Payer: Self-pay | Admitting: Vascular Surgery

## 2021-10-21 ENCOUNTER — Inpatient Hospital Stay: Admit: 2021-10-21 | Payer: Medicare Other | Admitting: Vascular Surgery

## 2021-10-21 SURGERY — INSERTION, ENDOVASCULAR STENT GRAFT, AORTA, ABDOMINAL
Anesthesia: General

## 2022-09-01 ENCOUNTER — Emergency Department (HOSPITAL_COMMUNITY)
Admission: EM | Admit: 2022-09-01 | Discharge: 2022-09-01 | Disposition: A | Discharge status: Home / Self Care | Attending: Emergency Medicine | Admitting: Emergency Medicine

## 2022-09-01 ENCOUNTER — Emergency Department (HOSPITAL_COMMUNITY)

## 2022-09-01 ENCOUNTER — Other Ambulatory Visit: Payer: Self-pay

## 2022-09-01 DIAGNOSIS — S0990XA Unspecified injury of head, initial encounter: Secondary | ICD-10-CM | POA: Diagnosis present

## 2022-09-01 DIAGNOSIS — Z7901 Long term (current) use of anticoagulants: Secondary | ICD-10-CM | POA: Insufficient documentation

## 2022-09-01 DIAGNOSIS — S0003XA Contusion of scalp, initial encounter: Secondary | ICD-10-CM

## 2022-09-01 DIAGNOSIS — R7309 Other abnormal glucose: Secondary | ICD-10-CM | POA: Diagnosis not present

## 2022-09-01 DIAGNOSIS — I714 Abdominal aortic aneurysm, without rupture, unspecified: Secondary | ICD-10-CM | POA: Diagnosis not present

## 2022-09-01 DIAGNOSIS — S60222A Contusion of left hand, initial encounter: Secondary | ICD-10-CM | POA: Diagnosis not present

## 2022-09-01 DIAGNOSIS — S60221A Contusion of right hand, initial encounter: Secondary | ICD-10-CM | POA: Diagnosis not present

## 2022-09-01 DIAGNOSIS — W19XXXA Unspecified fall, initial encounter: Secondary | ICD-10-CM

## 2022-09-01 DIAGNOSIS — S39012A Strain of muscle, fascia and tendon of lower back, initial encounter: Secondary | ICD-10-CM

## 2022-09-01 LAB — BASIC METABOLIC PANEL
Anion gap: 11 (ref 5–15)
BUN: 24 mg/dL — ABNORMAL HIGH (ref 8–23)
CO2: 25 mmol/L (ref 22–32)
Calcium: 8.7 mg/dL — ABNORMAL LOW (ref 8.9–10.3)
Chloride: 100 mmol/L (ref 98–111)
Creatinine, Ser: 2.19 mg/dL — ABNORMAL HIGH (ref 0.61–1.24)
GFR, Estimated: 31 mL/min — ABNORMAL LOW (ref >60)
Glucose, Bld: 98 mg/dL (ref 70–99)
Potassium: 3.6 mmol/L (ref 3.5–5.1)
Sodium: 136 mmol/L (ref 135–145)

## 2022-09-01 LAB — CBC WITH DIFFERENTIAL/PLATELET
Abs Immature Granulocytes: 0.03 10*3/uL (ref 0.00–0.07)
Basophils Absolute: 0 10*3/uL (ref 0.0–0.1)
Basophils Relative: 1 %
Eosinophils Absolute: 0.1 10*3/uL (ref 0.0–0.5)
Eosinophils Relative: 1 %
HCT: 34.1 % — ABNORMAL LOW (ref 39.0–52.0)
Hemoglobin: 11 g/dL — ABNORMAL LOW (ref 13.0–17.0)
Immature Granulocytes: 1 %
Lymphocytes Relative: 23 %
Lymphs Abs: 1.4 10*3/uL (ref 0.7–4.0)
MCH: 29.9 pg (ref 26.0–34.0)
MCHC: 32.3 g/dL (ref 30.0–36.0)
MCV: 92.7 fL (ref 80.0–100.0)
Monocytes Absolute: 0.5 10*3/uL (ref 0.1–1.0)
Monocytes Relative: 9 %
Neutro Abs: 4.1 10*3/uL (ref 1.7–7.7)
Neutrophils Relative %: 65 %
Platelets: 187 10*3/uL (ref 150–400)
RBC: 3.68 MIL/uL — ABNORMAL LOW (ref 4.22–5.81)
RDW: 13.5 % (ref 11.5–15.5)
WBC: 6.2 10*3/uL (ref 4.0–10.5)
nRBC: 0 % (ref 0.0–0.2)

## 2022-09-01 LAB — PROTIME-INR
INR: 1.2 (ref 0.8–1.2)
Prothrombin Time: 14.7 seconds (ref 11.4–15.2)

## 2022-09-01 LAB — URINALYSIS, ROUTINE W REFLEX MICROSCOPIC
Bilirubin Urine: NEGATIVE
Glucose, UA: NEGATIVE mg/dL
Hgb urine dipstick: NEGATIVE
Ketones, ur: NEGATIVE mg/dL
Leukocytes,Ua: NEGATIVE
Nitrite: NEGATIVE
Protein, ur: NEGATIVE mg/dL
Specific Gravity, Urine: 1.01 (ref 1.005–1.030)
pH: 5 (ref 5.0–8.0)

## 2022-09-01 LAB — APTT: aPTT: 34 seconds (ref 24–36)

## 2022-09-01 MED ORDER — ACETAMINOPHEN 500 MG PO TABS
1000.0000 mg | ORAL_TABLET | Freq: Once | ORAL | Status: AC
  Administered 2022-09-01: 1000 mg via ORAL
  Filled 2022-09-01: qty 2

## 2022-09-01 NOTE — ED Triage Notes (Signed)
Patient lost his balance and fell at home this morning , hit his head against the floor , denies LOC , he takes Plavix , reports headache with blurred vision and low back pain , evaluated by PA at arrival .

## 2022-09-01 NOTE — ED Notes (Signed)
Patient transported to CT 

## 2022-09-01 NOTE — Progress Notes (Signed)
Orthopedic Tech Progress Note Patient Details:  Isaac Stokes 12-15-1950 [id number]  Level 2 trauma Patient ID: [name], male   DOB: October 04, 1950, 72 y.o.   MRN: [id number]  Carin Primrose 09/01/2022, 7:59 PM

## 2022-09-01 NOTE — ED Provider Triage Note (Signed)
Emergency Medicine Provider Triage Evaluation Note  Isaac Stokes , a 72 y.o. male  was evaluated in triage.  Pt complains of fall onset 10 AM. Denies chest pain, shortness of breath, nausea, vomiting. Unsure of LOC. On plavix. Not on anticoagulants.   Review of Systems  Positive:  Negative:   Physical Exam  BP (!) 147/79   Pulse (!) 57   Temp 97.6 F (36.4 C) (Oral)   Resp 17   SpO2 96%  Gen:   Awake, no distress   Resp:  Normal effort  MSK:   Moves extremities without difficulty  Other:  No spinal TTP noted.   Medical Decision Making  Medically screening exam initiated at 9:03 PM.  Appropriate orders placed.  Isaac Stokes was informed that the remainder of the evaluation will be completed by another provider, this initial triage assessment does not replace that evaluation, and the importance of remaining in the ED until their evaluation is complete.     Zilla Shartzer A, PA-C 09/01/22 2103

## 2022-09-01 NOTE — ED Notes (Signed)
Patient expresses having multiple loose stools for about a week. Described as dark colored. EDP aware

## 2022-09-01 NOTE — ED Notes (Signed)
Trauma Response Nurse Documentation   Isaac Stokes is a 72 y.o. male arriving to [facility] ED via POV  On clopidogrel 75 mg daily. Trauma was activated as a Level 2 by ED charge RN based on the following trauma criteria Elderly patients > 65 with head trauma on anti-coagulation (excluding ASA). Trauma team at the bedside on patient arrival.   Patient cleared for CT by Dr. Gilford Raid EDP. Pt transported to CT with trauma response nurse present to monitor. RN remained with the patient throughout their absence from the department for clinical observation.   GCS 15.  History   Past Medical History:  Diagnosis Date   TIA (transient ischemic attack) 03/25/2021   3 this summer of 2022     Past Surgical History:  Procedure Laterality Date   BUBBLE STUDY  07/29/2021   Procedure: BUBBLE STUDY;  Surgeon: Thayer Headings, MD;  Location: [place];  Service: Cardiovascular;;   TEE WITHOUT CARDIOVERSION N/A 07/29/2021   Procedure: TRANSESOPHAGEAL ECHOCARDIOGRAM (TEE);  Surgeon: Acie Fredrickson Wonda Cheng, MD;  Location: [facility] ENDOSCOPY;  Service: Cardiovascular;  Laterality: N/A;       Initial Focused Assessment (If applicable, or please see trauma documentation): Alert/oriented male presents via POV after a fall with head injury, on plavix for abd aneuysm. C/o head pain, bilateral hand pain and hip pain Airway patent/unobstructed, BS clear No obvious uncontrolled hemorrhage GCS 15  CT's Completed:   CT Head and CT C-Spine CT L spine  Interventions:  IV start and trauma lab draw Portable chest, pelvis , bilateral hands XRAYS CT head, c-spine and L-spine  EKG  Plan for disposition:  Pending imaging  Consults completed:  none at the time of this note.  Event Summary: Patient arrives from Hospice office, states he fell and hit his head this morning. On plavix. No LOC. Workup pending imaging.   MTP Summary (If applicable): NA  Bedside handoff with ED RN Quentin Angst.    Terissa Haffey O [facility]   Trauma Response RN  Please call TRN at [phone] for further assistance.

## 2022-09-01 NOTE — ED Provider Notes (Signed)
[facility] EMERGENCY DEPARTMENT Provider Note   CSN: [id number] Arrival date & time: 09/01/22  1849     History  Chief Complaint  Patient presents with   Fall    Level 2 Takes Plavix / head injury    Isaac Stokes is a 72 y.o. male.  Pt is a 72 yo male with a pmhx significant for CVA, AAA, and PFO.  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  Pt said he turned around quickly and lost his balance and fell.  He did hit the back of his head.  He also has pain in his hands and in his back.  Pt is on Plavix.       Home Medications Prior to Admission medications   Medication Sig Start Date End Date Taking? Authorizing Provider  atorvastatin (LIPITOR) 40 MG tablet Take 40 mg by mouth daily as needed. Patient not taking: Reported on 09/20/2021 06/16/21   [provider]  atorvastatin (LIPITOR) 40 MG tablet Take 1 tablet by mouth daily.    [provider]  atorvastatin (LIPITOR) 80 MG tablet Take 1 tablet (80 mg total) by mouth daily. Patient not taking: Reported on 09/20/2021 03/30/21   Joseph Art, DO  ciclopirox [facility]) 8 % solution Apply 1 application topically at bedtime. Apply over nail and surrounding skin. Apply daily over previous coat. After seven (7) days, may remove with alcohol and continue cycle.    [provider]  clopidogrel (PLAVIX) 75 MG tablet Take 1 tablet (75 mg total) by mouth daily. 04/29/21   Levert Feinstein, MD      Allergies    Patient has no known allergies.    Review of Systems   Review of Systems  Musculoskeletal:        Bilateral hand pain; bilateral hips  Neurological:  Positive for headaches.  All other systems reviewed and are negative.   Physical Exam Updated Vital Signs BP 129/72   Pulse (!) 52   Temp 97.6 F (36.4 C) (Oral)   Resp (!) 22   SpO2 95%  Physical Exam Vitals and nursing note reviewed.  Constitutional:      Appearance: Normal appearance.  HENT:     Head:  Normocephalic.      Right Ear: External ear normal.     Left Ear: External ear normal.     Nose: Nose normal.     Mouth/Throat:     Mouth: Mucous membranes are moist.     Pharynx: Oropharynx is clear.  Eyes:     Extraocular Movements: Extraocular movements intact.     Conjunctiva/sclera: Conjunctivae normal.     Pupils: Pupils are equal, round, and reactive to light.  Cardiovascular:     Rate and Rhythm: Normal rate and regular rhythm.     Pulses: Normal pulses.     Heart sounds: Normal heart sounds.  Pulmonary:     Effort: Pulmonary effort is normal.     Breath sounds: Normal breath sounds.  Abdominal:     General: Abdomen is flat. Bowel sounds are normal.     Palpations: Abdomen is soft.  Musculoskeletal:     Cervical back: Normal range of motion and neck supple.     Comments: Bilateral hand tenderness  Skin:    General: Skin is warm.     Capillary Refill: Capillary refill takes less than 2 seconds.  Neurological:     General: No focal deficit present.  Mental Status: He is alert and oriented to person, place, and time.  Psychiatric:        Mood and Affect: Mood normal.        Behavior: Behavior normal.     ED Results / Procedures / Treatments   Labs (all labs ordered are listed, but only abnormal results are displayed) Labs Reviewed  BASIC METABOLIC PANEL - Abnormal; Notable for the following components:      Result Value   BUN 24 (*)    Creatinine, Ser 2.19 (*)    Calcium 8.7 (*)    GFR, Estimated 31 (*)    All other components within normal limits  CBC WITH DIFFERENTIAL/PLATELET - Abnormal; Notable for the following components:   RBC 3.68 (*)    Hemoglobin 11.0 (*)    HCT 34.1 (*)    All other components within normal limits  PROTIME-INR  APTT  URINALYSIS, ROUTINE W REFLEX MICROSCOPIC  CBG MONITORING, ED    EKG EKG Interpretation  Date/Time:  Thursday September 01 2022 18:56:02 EST Ventricular Rate:  70 PR Interval:  154 QRS Duration: 90 QT  Interval:  394 QTC Calculation: 425 R Axis:   -11 Text Interpretation: Normal sinus rhythm Normal ECG When compared with ECG of 09-Sep-2021 23:50, PREVIOUS ECG IS PRESENT No significant change since last tracing Confirmed by Isla Pence [phone]) on 09/01/2022 7:40:33 PM  Radiology DG Hand Complete Right  Result Date: 09/01/2022 CLINICAL DATA:  Status post fall. EXAM: RIGHT HAND - COMPLETE 3+ VIEW COMPARISON:  None Available. FINDINGS: There is no evidence of fracture or dislocation. There is no evidence of arthropathy or other focal bone abnormality. Soft tissues are unremarkable. IMPRESSION: Negative. Electronically Signed   By: Virgina Norfolk M.D.   On: 09/01/2022 20:16   CT Head Wo Contrast  Result Date: 09/01/2022 CLINICAL DATA:  Hit head takes Plavix EXAM: CT HEAD WITHOUT CONTRAST CT CERVICAL SPINE WITHOUT CONTRAST TECHNIQUE: Multidetector CT imaging of the head and cervical spine was performed following the standard protocol without intravenous contrast. Multiplanar CT image reconstructions of the cervical spine were also generated. RADIATION DOSE REDUCTION: This exam was performed according to the departmental dose-optimization program which includes automated exposure control, adjustment of the mA and/or kV according to patient size and/or use of iterative reconstruction technique. COMPARISON:  MRI 7 32,022, CT brain 03/26/2021 FINDINGS: CT HEAD FINDINGS Brain: No acute territorial infarction, hemorrhage or intracranial mass. Chronic left occipital infarct. Chronic lacunar infarct right thalamus. Small chronic ganglial capsular infarct extending to the right white matter. Atrophy and chronic small vessel ischemic changes of the white matter. Stable ex vacuo dilatation of right ventricle. Vascular: No hyperdense vessels.  Carotid vascular calcification Skull: Normal. Negative for fracture or focal lesion. Sinuses/Orbits: No acute finding. Other: None CT CERVICAL SPINE FINDINGS Alignment: No  subluxation.  Facet alignment within normal limits. Skull base and vertebrae: No acute fracture. No primary bone lesion or focal pathologic process. Soft tissues and spinal canal: No prevertebral fluid or swelling. No visible canal hematoma. Disc levels: Advanced disc space narrowing and degenerative change C5-C6 and C6-C7. B multilevel foraminal narrowing, severe at C5-C6. Upper chest: Emphysema Other: None IMPRESSION: No CT evidence for acute intracranial abnormality. Atrophy and chronic ischemic changes of the white matter. Chronic multifocal infarcts. Degenerative changes of the cervical spine without acute osseous abnormality. Emphysema (ICD10-J43.9). Electronically Signed   By: Donavan Foil M.D.   On: 09/01/2022 19:58   CT Cervical Spine Wo Contrast  Result Date: 09/01/2022  CLINICAL DATA:  Hit head takes Plavix EXAM: CT HEAD WITHOUT CONTRAST CT CERVICAL SPINE WITHOUT CONTRAST TECHNIQUE: Multidetector CT imaging of the head and cervical spine was performed following the standard protocol without intravenous contrast. Multiplanar CT image reconstructions of the cervical spine were also generated. RADIATION DOSE REDUCTION: This exam was performed according to the departmental dose-optimization program which includes automated exposure control, adjustment of the mA and/or kV according to patient size and/or use of iterative reconstruction technique. COMPARISON:  MRI 7 32,022, CT brain 03/26/2021 FINDINGS: CT HEAD FINDINGS Brain: No acute territorial infarction, hemorrhage or intracranial mass. Chronic left occipital infarct. Chronic lacunar infarct right thalamus. Small chronic ganglial capsular infarct extending to the right white matter. Atrophy and chronic small vessel ischemic changes of the white matter. Stable ex vacuo dilatation of right ventricle. Vascular: No hyperdense vessels.  Carotid vascular calcification Skull: Normal. Negative for fracture or focal lesion. Sinuses/Orbits: No acute finding.  Other: None CT CERVICAL SPINE FINDINGS Alignment: No subluxation.  Facet alignment within normal limits. Skull base and vertebrae: No acute fracture. No primary bone lesion or focal pathologic process. Soft tissues and spinal canal: No prevertebral fluid or swelling. No visible canal hematoma. Disc levels: Advanced disc space narrowing and degenerative change C5-C6 and C6-C7. B multilevel foraminal narrowing, severe at C5-C6. Upper chest: Emphysema Other: None IMPRESSION: No CT evidence for acute intracranial abnormality. Atrophy and chronic ischemic changes of the white matter. Chronic multifocal infarcts. Degenerative changes of the cervical spine without acute osseous abnormality. Emphysema (ICD10-J43.9). Electronically Signed   By: Jasmine Pang M.D.   On: 09/01/2022 19:58   DG Pelvis Portable  Result Date: 09/01/2022 CLINICAL DATA:  Trauma fall EXAM: PORTABLE PELVIS 1-2 VIEWS COMPARISON:  None Available. FINDINGS: SI joints are non widened. Pubic symphysis and rami appear intact. No definitive fracture or malalignment. Vascular calcifications. IMPRESSION: No acute osseous abnormality. Electronically Signed   By: Jasmine Pang M.D.   On: 09/01/2022 19:47   DG Chest Portable 1 View  Result Date: 09/01/2022 CLINICAL DATA:  Trauma fall EXAM: PORTABLE CHEST 1 VIEW COMPARISON:  None Available. FINDINGS: No acute airspace disease or effusion. Normal cardiomediastinal silhouette with aortic atherosclerosis. No pneumothorax IMPRESSION: No active disease. Electronically Signed   By: Jasmine Pang M.D.   On: 09/01/2022 19:47    Procedures Procedures    Medications Ordered in ED Medications  acetaminophen (TYLENOL) tablet 1,000 mg (1,000 mg Oral Given 09/01/22 2012)    ED Course/ Medical Decision Making/ A&P                           Medical Decision Making Amount and/or Complexity of Data Reviewed Radiology: ordered.  Risk OTC drugs.   This patient presents to the ED for concern of fall, this  involves an extensive number of treatment options, and is a complaint that carries with it a high risk of complications and morbidity.  The differential diagnosis includes multiple trauma   Co morbidities that complicate the patient evaluation  CVA, AAA, and PFO   Additional history obtained:  Additional history obtained from epic chart review  Lab Tests:  I Ordered, and personally interpreted labs.  The pertinent results include:  ua neg, bmp with cr 2.19 (1.63 in Jan of last year); cbc with hgb 11 (11.9 in Jan)   Imaging Studies ordered:  I ordered imaging studies including cxr, pelvis, hand, ct head/c-spine,  lumbar I independently visualized and interpreted imaging which showed  CXR: No active disease.  Pelvis: No acute osseous abnormality.  CT head/C-spine: No CT evidence for acute intracranial abnormality. Atrophy and  chronic ischemic changes of the white matter. Chronic multifocal  infarcts.    Degenerative changes of the cervical spine without acute osseous  abnormality.    Emphysema (ICD10-J43.9).  R and L hand:  neg CT lumbar: 1. 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 The last fully formed disc space is labeled L5-S1. Please correlate with imaging any intervention is intended. 2. No acute fracture in the lumbar spine. 3. L2-L3 left foraminal and extreme lateral protrusion, which may contact the exiting left L3 nerve. Narrowing of the left lateral recess, which could affect the descending left L3 nerve roots, and moderate right neural foraminal narrowing. 4. L3-L4 mild spinal canal stenosis and mild bilateral neural foraminal narrowing. Narrowing of the left lateral recess could affect the descending left L4 nerve roots. 5. Infrarenal abdominal aortic aneurysm, which is incompletely imaged but measures up to 6.7 cm, previously 6.4 cm on 09/30/2021. Recommend referral to a vascular specialist. This recommendation follows ACR consensus guidelines:  White Paper of the ACR Incidental Findings Committee II on Vascular Findings. J Am Coll Radiol 2013; 10:789-794.  I agree with the radiologist interpretation   Cardiac Monitoring:  The patient was maintained on a cardiac monitor.  I personally viewed and interpreted the cardiac monitored which showed an underlying rhythm of: nsr   Medicines ordered and prescription drug management:  I ordered medication including tylenol  for pain  Reevaluation of the patient after these medicines showed that the patient improved I have reviewed the patients home medicines and have made adjustments as needed   Test Considered:  ct   Problem List / ED Course:  Fall:  no fx or internal injury.  Pt is able to ambulate. AAA:  this is known.  Pt has seen vascular and has decided not to do the operation.   Reevaluation:  After the interventions noted above, I reevaluated the patient and found that they have :improved   Social Determinants of Health:  Lives at [place]:  After consideration of the diagnostic results and the patients response to treatment, I feel that the patent would benefit from discharge with outpatient f/u.          Final Clinical Impression(s) / ED Diagnoses Final diagnoses:  Fall, initial encounter  Contusion of scalp, initial encounter  Contusion of left hand, initial encounter  Contusion of right hand, initial encounter  Back strain, initial encounter  Abdominal aortic aneurysm (AAA) without rupture, unspecified part [facility])    Rx / DC Orders ED Discharge Orders     None         Isla Pence, MD 09/01/22 2230

## 2022-09-01 NOTE — ED Notes (Signed)
Patient ambulated in room, a lap performed in room. Tolerated very well. NAD

## 2022-09-01 NOTE — ED Notes (Signed)
Pt states he lives at [facility]

## 2022-09-07 ENCOUNTER — Encounter (HOSPITAL_COMMUNITY): Payer: Self-pay | Admitting: Internal Medicine

## 2022-09-07 ENCOUNTER — Other Ambulatory Visit: Payer: Self-pay

## 2022-09-07 ENCOUNTER — Observation Stay (HOSPITAL_COMMUNITY)

## 2022-09-07 ENCOUNTER — Inpatient Hospital Stay (HOSPITAL_COMMUNITY)
Admission: EM | Admit: 2022-09-07 | Discharge: 2022-09-09 | DRG: 312 | Disposition: A | Discharge status: Hospice | Attending: Internal Medicine | Admitting: Internal Medicine

## 2022-09-07 ENCOUNTER — Emergency Department (HOSPITAL_COMMUNITY)

## 2022-09-07 DIAGNOSIS — W19XXXA Unspecified fall, initial encounter: Secondary | ICD-10-CM | POA: Diagnosis present

## 2022-09-07 DIAGNOSIS — Z23 Encounter for immunization: Secondary | ICD-10-CM

## 2022-09-07 DIAGNOSIS — R413 Other amnesia: Secondary | ICD-10-CM | POA: Diagnosis not present

## 2022-09-07 DIAGNOSIS — S0636AA Traumatic hemorrhage of cerebrum, unspecified, with loss of consciousness status unknown, initial encounter: Secondary | ICD-10-CM | POA: Diagnosis present

## 2022-09-07 DIAGNOSIS — R296 Repeated falls: Secondary | ICD-10-CM | POA: Diagnosis present

## 2022-09-07 DIAGNOSIS — Z79899 Other long term (current) drug therapy: Secondary | ICD-10-CM

## 2022-09-07 DIAGNOSIS — I951 Orthostatic hypotension: Principal | ICD-10-CM | POA: Diagnosis present

## 2022-09-07 DIAGNOSIS — Z9989 Dependence on other enabling machines and devices: Secondary | ICD-10-CM

## 2022-09-07 DIAGNOSIS — N1832 Chronic kidney disease, stage 3b: Secondary | ICD-10-CM | POA: Diagnosis present

## 2022-09-07 DIAGNOSIS — R55 Syncope and collapse: Secondary | ICD-10-CM

## 2022-09-07 DIAGNOSIS — S0181XA Laceration without foreign body of other part of head, initial encounter: Secondary | ICD-10-CM | POA: Diagnosis present

## 2022-09-07 DIAGNOSIS — R001 Bradycardia, unspecified: Secondary | ICD-10-CM | POA: Diagnosis present

## 2022-09-07 DIAGNOSIS — Z7902 Long term (current) use of antithrombotics/antiplatelets: Secondary | ICD-10-CM

## 2022-09-07 DIAGNOSIS — Q2112 Patent foramen ovale: Secondary | ICD-10-CM

## 2022-09-07 DIAGNOSIS — Z8673 Personal history of transient ischemic attack (TIA), and cerebral infarction without residual deficits: Secondary | ICD-10-CM

## 2022-09-07 DIAGNOSIS — S06309A Unspecified focal traumatic brain injury with loss of consciousness of unspecified duration, initial encounter: Secondary | ICD-10-CM

## 2022-09-07 DIAGNOSIS — S06359A Traumatic hemorrhage of left cerebrum with loss of consciousness of unspecified duration, initial encounter: Secondary | ICD-10-CM | POA: Diagnosis present

## 2022-09-07 DIAGNOSIS — N183 Chronic kidney disease, stage 3 unspecified: Secondary | ICD-10-CM | POA: Diagnosis present

## 2022-09-07 DIAGNOSIS — Z66 Do not resuscitate: Secondary | ICD-10-CM | POA: Diagnosis present

## 2022-09-07 DIAGNOSIS — I700 Atherosclerosis of aorta: Secondary | ICD-10-CM | POA: Diagnosis present

## 2022-09-07 DIAGNOSIS — I714 Abdominal aortic aneurysm, without rupture, unspecified: Secondary | ICD-10-CM | POA: Diagnosis present

## 2022-09-07 DIAGNOSIS — N179 Acute kidney failure, unspecified: Secondary | ICD-10-CM

## 2022-09-07 DIAGNOSIS — F17210 Nicotine dependence, cigarettes, uncomplicated: Secondary | ICD-10-CM | POA: Diagnosis present

## 2022-09-07 HISTORY — DX: Cerebral infarction, unspecified: I63.9

## 2022-09-07 HISTORY — DX: Abdominal aortic aneurysm, without rupture, unspecified: I71.40

## 2022-09-07 HISTORY — DX: Dizziness and giddiness: R42

## 2022-09-07 LAB — CBC
HCT: 33.9 % — ABNORMAL LOW (ref 39.0–52.0)
Hemoglobin: 10.7 g/dL — ABNORMAL LOW (ref 13.0–17.0)
MCH: 30.4 pg (ref 26.0–34.0)
MCHC: 31.6 g/dL (ref 30.0–36.0)
MCV: 96.3 fL (ref 80.0–100.0)
Platelets: 177 10*3/uL (ref 150–400)
RBC: 3.52 MIL/uL — ABNORMAL LOW (ref 4.22–5.81)
RDW: 13.8 % (ref 11.5–15.5)
WBC: 4.8 10*3/uL (ref 4.0–10.5)
nRBC: 0 % (ref 0.0–0.2)

## 2022-09-07 LAB — I-STAT CHEM 8, ED
BUN: 24 mg/dL — ABNORMAL HIGH (ref 8–23)
Calcium, Ion: 1.02 mmol/L — ABNORMAL LOW (ref 1.15–1.40)
Chloride: 107 mmol/L (ref 98–111)
Creatinine, Ser: 2 mg/dL — ABNORMAL HIGH (ref 0.61–1.24)
Glucose, Bld: 88 mg/dL (ref 70–99)
HCT: 32 % — ABNORMAL LOW (ref 39.0–52.0)
Hemoglobin: 10.9 g/dL — ABNORMAL LOW (ref 13.0–17.0)
Potassium: 4.3 mmol/L (ref 3.5–5.1)
Sodium: 138 mmol/L (ref 135–145)
TCO2: 23 mmol/L (ref 22–32)

## 2022-09-07 LAB — URINALYSIS, ROUTINE W REFLEX MICROSCOPIC
Bilirubin Urine: NEGATIVE
Glucose, UA: NEGATIVE mg/dL
Hgb urine dipstick: NEGATIVE
Ketones, ur: NEGATIVE mg/dL
Leukocytes,Ua: NEGATIVE
Nitrite: NEGATIVE
Protein, ur: NEGATIVE mg/dL
Specific Gravity, Urine: 1.008 (ref 1.005–1.030)
pH: 6 (ref 5.0–8.0)

## 2022-09-07 LAB — SAMPLE TO BLOOD BANK

## 2022-09-07 LAB — COMPREHENSIVE METABOLIC PANEL
ALT: 9 U/L (ref 0–44)
AST: 17 U/L (ref 15–41)
Albumin: 3.2 g/dL — ABNORMAL LOW (ref 3.5–5.0)
Alkaline Phosphatase: 54 U/L (ref 38–126)
Anion gap: 9 (ref 5–15)
BUN: 21 mg/dL (ref 8–23)
CO2: 22 mmol/L (ref 22–32)
Calcium: 8.5 mg/dL — ABNORMAL LOW (ref 8.9–10.3)
Chloride: 106 mmol/L (ref 98–111)
Creatinine, Ser: 1.86 mg/dL — ABNORMAL HIGH (ref 0.61–1.24)
GFR, Estimated: 38 mL/min — ABNORMAL LOW (ref >60)
Glucose, Bld: 88 mg/dL (ref 70–99)
Potassium: 4.3 mmol/L (ref 3.5–5.1)
Sodium: 137 mmol/L (ref 135–145)
Total Bilirubin: 0.5 mg/dL (ref 0.3–1.2)
Total Protein: 6.3 g/dL — ABNORMAL LOW (ref 6.5–8.1)

## 2022-09-07 LAB — PROTIME-INR
INR: 1.1 (ref 0.8–1.2)
Prothrombin Time: 14.5 seconds (ref 11.4–15.2)

## 2022-09-07 LAB — TROPONIN I (HIGH SENSITIVITY)
Troponin I (High Sensitivity): 3 ng/L (ref <18)
Troponin I (High Sensitivity): 4 ng/L (ref <18)

## 2022-09-07 LAB — ETHANOL: Alcohol, Ethyl (B): <10 mg/dL (ref <10)

## 2022-09-07 LAB — LACTIC ACID, PLASMA: Lactic Acid, Venous: 1.2 mmol/L (ref 0.5–1.9)

## 2022-09-07 LAB — TSH: TSH: 1.197 u[IU]/mL (ref 0.350–4.500)

## 2022-09-07 MED ORDER — INFLUENZA VAC A&B SA ADJ QUAD 0.5 ML IM PRSY
0.5000 mL | PREFILLED_SYRINGE | INTRAMUSCULAR | Status: AC
  Administered 2022-09-08: 0.5 mL via INTRAMUSCULAR
  Filled 2022-09-07: qty 0.5

## 2022-09-07 MED ORDER — ATORVASTATIN CALCIUM 40 MG PO TABS
40.0000 mg | ORAL_TABLET | Freq: Every day | ORAL | Status: DC
  Administered 2022-09-07 – 2022-09-09 (×3): 40 mg via ORAL
  Filled 2022-09-07 (×3): qty 1

## 2022-09-07 MED ORDER — PNEUMOCOCCAL 20-VAL CONJ VACC 0.5 ML IM SUSY
0.5000 mL | PREFILLED_SYRINGE | INTRAMUSCULAR | Status: AC
  Administered 2022-09-08: 0.5 mL via INTRAMUSCULAR
  Filled 2022-09-07: qty 0.5

## 2022-09-07 MED ORDER — ACETAMINOPHEN 650 MG RE SUPP: 650.0000 mg | Freq: Four times a day (QID) | RECTAL | Status: DC | PRN

## 2022-09-07 MED ORDER — SODIUM CHLORIDE 0.9% FLUSH
3.0000 mL | Freq: Two times a day (BID) | INTRAVENOUS | Status: DC
  Administered 2022-09-07 – 2022-09-09 (×4): 3 mL via INTRAVENOUS

## 2022-09-07 MED ORDER — MORPHINE SULFATE (PF) 4 MG/ML IV SOLN
4.0000 mg | Freq: Once | INTRAVENOUS | Status: AC
  Administered 2022-09-07: 4 mg via INTRAVENOUS
  Filled 2022-09-07: qty 1

## 2022-09-07 MED ORDER — SODIUM CHLORIDE 0.9 % IV BOLUS
500.0000 mL | Freq: Once | INTRAVENOUS | Status: AC
  Administered 2022-09-07: 500 mL via INTRAVENOUS

## 2022-09-07 MED ORDER — OXYCODONE HCL 5 MG PO TABS: 5.0000 mg | ORAL_TABLET | Freq: Four times a day (QID) | ORAL | Status: DC | PRN

## 2022-09-07 MED ORDER — ACETAMINOPHEN 325 MG PO TABS: 650.0000 mg | ORAL_TABLET | Freq: Four times a day (QID) | ORAL | Status: DC | PRN

## 2022-09-07 NOTE — ED Triage Notes (Signed)
Pt bib ems from home where pt with witnessed syncopal episode. Pt alert and oriented. VSS with ems. Arrives in [place]. Pt on plavix. Abrasion to L head.

## 2022-09-07 NOTE — H&P (Signed)
History and Physical    Isaac Stokes ION:[id number] DOB: 1951-03-09 DOA: 09/07/2022  PCP: [facility] (Confirm with patient/family/NH records and if not entered, this has to be entered at [facility] point of entry) Patient coming from: Home  I have personally briefly reviewed patient's old medical records in [facility]  Chief Complaint: I fell  HPI: Isaac Stokes is a 72 y.o. 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  At baseline, patient has chronic ambulation dysfunction with bilateral leg weakness who was recently started to use roller walker.  Recently, in 1 to 2 weeks, patient has had multiple episodes of lightheadedness, Gaye Pollack happens when changing body positions from lying down to standing up.  This morning, he rose up to go to the door, and suddenly lost consciousness, he does not remember any prodromes of lightheadedness blurry vision nauseous.  He thought he might have rose up too quickly.  Patient fell forward and hit left forehead.  He recovered consciousness himself denies any loss control of urine or bowel movements.  4 days ago, patient had a similar syncope episode and came to ED, CT head was negative and patient reassured and sent home.  Regarding that episode, patient remember that he did feel lightheadedness before falling down, when he described he was turning around to reach something on the floor and started to feel lightheaded blurry vision and fell down. ED Course: Borderline bradycardia heart rate ranging from lower 50s to lower 60s, blood pressure stable, orthostatic vital signs unknown.  CT head showed left anterior frontal lobe subcortical hemorrhage 14 x 14 x 8 mm, and multiple other small subcortical hemorrhage present.  Neurosurgery consulted who reviewed the  imaging and recommended conservative management and repeat CT scan in 24 hours.  Hold Plavix for 5 days  Review of Systems: As per HPI otherwise 14 point review of systems negative.    Past Medical History:  Diagnosis Date   TIA (transient ischemic attack) 03/25/2021   3 this summer of 2022    Past Surgical History:  Procedure Laterality Date   BUBBLE STUDY  07/29/2021   Procedure: BUBBLE STUDY;  Surgeon: [name], MD;  Location: [facility] ENDOSCOPY;  Service: Cardiovascular;;   TEE WITHOUT CARDIOVERSION N/A 07/29/2021   Procedure: TRANSESOPHAGEAL ECHOCARDIOGRAM (TEE);  Surgeon: Elease Hashimoto Deloris Quamel Fitzmaurice, MD;  Location: [facility] ENDOSCOPY;  Service: Cardiovascular;  Laterality: N/A;     reports that he has been smoking cigarettes. He has been smoking an average of .25 packs per day. He has never used smokeless tobacco. He reports that he does not drink alcohol and does not use drugs.  No Known Allergies  No family history on file.   Prior to Admission medications   Medication Sig Start Date End Date Taking? Authorizing Provider  clopidogrel (PLAVIX) 75 MG tablet Take 1 tablet (75 mg total) by mouth daily. 04/29/21  Yes Levert Feinstein, MD  atorvastatin (LIPITOR) 40 MG tablet Take 40 mg by mouth daily as needed. Patient not taking: Reported on 09/20/2021 06/16/21   [provider]  atorvastatin (LIPITOR) 40 MG tablet Take 1 tablet by mouth daily.    [provider]  atorvastatin (LIPITOR) 80 MG tablet Take 1 tablet (80 mg total) by mouth daily. Patient not taking: Reported on 09/20/2021 03/30/21   Joseph Art, DO  ciclopirox [facility])  8 % solution Apply 1 application topically at bedtime. Apply over nail and surrounding skin. Apply daily over previous coat. After seven (7) days, may remove with alcohol and continue cycle.    [provider]    Physical Exam: Vitals:   09/07/22 1032 09/07/22 1033 09/07/22 1145 09/07/22 1324  BP:   (!) 151/83 137/84  Pulse:   (!) 51 64   Resp:   15 16  Temp:  (!) 97.4 F (36.3 C)  97.8 F (36.6 C)  TempSrc:  Oral  Oral  SpO2:   99% 98%  Weight: 74.8 kg     Height: 6' (1.829 m)       Constitutional: NAD, calm, comfortable Vitals:   09/07/22 1032 09/07/22 1033 09/07/22 1145 09/07/22 1324  BP:   (!) 151/83 137/84  Pulse:   (!) 51 64  Resp:   15 16  Temp:  (!) 97.4 F (36.3 C)  97.8 F (36.6 C)  TempSrc:  Oral  Oral  SpO2:   99% 98%  Weight: 74.8 kg     Height: 6' (1.829 m)      Eyes: PERRL, lids and conjunctivae normal ENMT: Mucous membranes are moist. Posterior pharynx clear of any exudate or lesions.Normal dentition.  Neck: normal, supple, no masses, no thyromegaly Respiratory: clear to auscultation bilaterally, no wheezing, no crackles. Normal respiratory effort. No accessory muscle use.  Cardiovascular: Regular rate and rhythm, no murmurs / rubs / gallops. No extremity edema. 2+ pedal pulses. No carotid bruits.  Abdomen: no tenderness, no masses palpated. No hepatosplenomegaly. Bowel sounds positive.  Musculoskeletal: no clubbing / cyanosis. No joint deformity upper and lower extremities. Good ROM, no contractures. Normal muscle tone.  Skin: no rashes, lesions, ulcers. No induration Neurologic: CN 2-12 grossly intact. Sensation intact, DTR normal. Strength 5/5 in all 4.  Psychiatric: Normal judgment and insight. Alert and oriented x 3. Normal mood.     Labs on Admission: I have personally reviewed following labs and imaging studies  CBC: Recent Labs  Lab 09/01/22 1913 09/07/22 1029 09/07/22 1043  WBC 6.2 4.8  --   NEUTROABS 4.1  --   --   HGB 11.0* 10.7* 10.9*  HCT 34.1* 33.9* 32.0*  MCV 92.7 96.3  --   PLT 187 177  --    Basic Metabolic Panel: Recent Labs  Lab 09/01/22 1913 09/07/22 1029 09/07/22 1043  NA 136 137 138  K 3.6 4.3 4.3  CL 100 106 107  CO2 25 22  --   GLUCOSE 98 88 88  BUN 24* 21 24*  CREATININE 2.19* 1.86* 2.00*  CALCIUM 8.7* 8.5*  --    GFR: Estimated Creatinine  Clearance: 34.8 mL/min (A) (by C-G formula based on SCr of 2 mg/dL (H)). Liver Function Tests: Recent Labs  Lab 09/07/22 1029  AST 17  ALT 9  ALKPHOS 54  BILITOT 0.5  PROT 6.3*  ALBUMIN 3.2*   No results for input(s): "LIPASE", "AMYLASE" in the last 168 hours. No results for input(s): "AMMONIA" in the last 168 hours. Coagulation Profile: Recent Labs  Lab 09/01/22 1913 09/07/22 1029  INR 1.2 1.1   Cardiac Enzymes: No results for input(s): "CKTOTAL", "CKMB", "CKMBINDEX", "TROPONINI" in the last 168 hours. BNP (last 3 results) No results for input(s): "PROBNP" in the last 8760 hours. HbA1C: No results for input(s): "HGBA1C" in the last 72 hours. CBG: No results for input(s): "GLUCAP" in the last 168 hours. Lipid Profile: No results for input(s): "CHOL", "HDL", "LDLCALC", "TRIG", "  CHOLHDL", "LDLDIRECT" in the last 72 hours. Thyroid Function Tests: No results for input(s): "TSH", "T4TOTAL", "FREET4", "T3FREE", "THYROIDAB" in the last 72 hours. Anemia Panel: No results for input(s): "VITAMINB12", "FOLATE", "FERRITIN", "TIBC", "IRON", "RETICCTPCT" in the last 72 hours. Urine analysis:    Component Value Date/Time   COLORURINE STRAW (A) 09/07/2022 1322   APPEARANCEUR CLEAR 09/07/2022 1322   LABSPEC 1.008 09/07/2022 1322   PHURINE 6.0 09/07/2022 1322   GLUCOSEU NEGATIVE 09/07/2022 1322   HGBUR NEGATIVE 09/07/2022 1322   BILIRUBINUR NEGATIVE 09/07/2022 1322   KETONESUR NEGATIVE 09/07/2022 1322   PROTEINUR NEGATIVE 09/07/2022 1322   NITRITE NEGATIVE 09/07/2022 1322   LEUKOCYTESUR NEGATIVE 09/07/2022 1322    Radiological Exams on Admission: CT CERVICAL SPINE WO CONTRAST  Result Date: 09/07/2022 CLINICAL DATA:  Poly trauma, blunt. Syncopal episode with head injury. EXAM: CT CERVICAL SPINE WITHOUT CONTRAST TECHNIQUE: Multidetector CT imaging of the cervical spine was performed without intravenous contrast. Multiplanar CT image reconstructions were also generated. RADIATION  DOSE REDUCTION: This exam was performed according to the departmental dose-optimization program which includes automated exposure control, adjustment of the mA and/or kV according to patient size and/or use of iterative reconstruction technique. COMPARISON:  CT of the cervical spine 09/01/2022. FINDINGS: Alignment: No significant listhesis is present. Cervical lordosis is stable. Skull base and vertebrae: The craniocervical junction is normal. Upper cervical spine is within normal limits. Marrow signal is unremarkable. Vertebral body heights are stable. No acute fractures are present. Soft tissues and spinal canal: No prevertebral fluid or swelling. No visible canal hematoma. Disc levels: Multilevel degenerative changes are again noted in the cervical spine, most significant disease is present at C5-6. Upper chest: The lung apices are clear. Centrilobular emphysematous changes are noted. IMPRESSION: 1. No acute fracture or traumatic subluxation. 2. Multilevel degenerative changes of the cervical spine are again noted, most significant at C5-6. 3.  Emphysema (ICD10-J43.9). Electronically Signed   By: Marin Roberts M.D.   On: 09/07/2022 11:37   CT HEAD WO CONTRAST  Result Date: 09/07/2022 CLINICAL DATA:  Head trauma, moderate to severe. Witnessed syncopal episode. EXAM: CT HEAD WITHOUT CONTRAST TECHNIQUE: Contiguous axial images were obtained from the base of the skull through the vertex without intravenous contrast. RADIATION DOSE REDUCTION: This exam was performed according to the departmental dose-optimization program which includes automated exposure control, adjustment of the mA and/or kV according to patient size and/or use of iterative reconstruction technique. COMPARISON:  CT head without contrast 09/01/2022 FINDINGS: Brain: Acute subcortical hemorrhage is present in the high anterior left frontal lobe measuring 14 x 14 x 8 mm (AP x CC x TR). Multiple other smaller subcortical hemorrhages are  present. A smaller cortical or subcortical based hemorrhage is present in the anterior left frontal lobe on axial image 24 of series 3. A focal hyperdense focus is present in the posteromedial right occipital lobe measuring 4 mm. Multiple hyperdense small foci are present in the anterior right frontal lobe. The largest measures 2 mm on sagittal image 16 of series 6. No significant extra-axial collections are present. 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 A remote lacunar infarct is present in the lateral right thalamus. No acute ischemic infarcts are present. Brainstem and cerebellum are otherwise normal. Vascular: Atherosclerotic calcifications are present in the cavernous internal carotid arteries and left vertebral artery without a hyperdense vessel. Skull: Left frontal scalp soft tissue swelling extends to the lateral orbit. No underlying fracture or  foreign body is present. Sinuses/Orbits: The paranasal sinuses and mastoid air cells are clear. The globes and orbits are within normal limits. Other: IMPRESSION: 1. Multiple cortical and subcortical hemorrhages compatible with acute trauma. The largest is an acute subcortical hemorrhage in the high anterior left frontal lobe measuring 14 x 14 x 8 mm. Given the location and multiple lesions, these likely represent shear injuries. 2. 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 3. Remote lacunar infarct in the lateral right thalamus. 4. Left frontal scalp soft tissue swelling extends to the lateral orbit. No underlying fracture or foreign body is present. Critical Value/emergent results were called by telephone at the time of interpretation on 09/07/2022 at 11:34 am to provider [facility] , who verbally acknowledged these results. Electronically Signed   By: San Morelle M.D.   On: 09/07/2022 11:34   DG Chest 2 View  Result Date:  09/07/2022 CLINICAL DATA:  Fall.  Syncope EXAM: CHEST - 2 VIEW COMPARISON:  One view x-ray 09/01/2022.  Older exams as well. FINDINGS: Overlapping cardiac leads. No consolidation, pneumothorax or effusion. No edema. Calcified tortuous aorta. Degenerative changes of the spine on lateral view. IMPRESSION: No acute cardiopulmonary disease. Electronically Signed   By: Jill Side M.D.   On: 09/07/2022 11:09    EKG: Independently reviewed.  Sinus bradycardia, no acute ST changes.  Assessment/Plan Principal Problem:   Syncope Active Problems:   CKD (chronic kidney disease) stage 3, GFR 30-59 ml/min (HCC)  (please populate well all problems here in Problem List. (For example, if patient is on BP meds at home and you resume or decide to hold them, it is a problem that needs to be her. Same for CAD, COPD, HLD and so on)  Syncope, recurrent -Appears to be vasovagal although bradycardia related etiology cannot be ruled out. -Check orthostatic vital signs, telemetry monitoring x 24 hours, may need to consider loop depends on the telemetry monitoring result.  Consider inpatient cardiology consultation if positive finding on telemonitoring. -Check TSH -Echocardiogram -Neurosurgery consultation appreciated, repeat CT head tomorrow morning, hold off Plavix for 5 days  CKD stage IIIb -Clinically appears to be euvolemic, check orthostatic vital signs -Blood pressure regulated  Abdominal aortic aneurysm -Large, indication for repair.  And has been following with vascular surgeon and [facility] and was offered AAA repair last year patient declined.  Patient is DNR. -Last imaging was one year ago, order a aorta U/S  PFO -Less likely to cause syncope episode -Can resume Plavix after 5 days  Chronic ambulation dysfunction -On roller walker for about 1-month, will consult PT evaluation.  DVT prophylaxis: SCD Code Status: DNR Family Communication: None at present Disposition Plan: Expect less than 2  midnight hospital stay Consults called: Neurosurgery Admission status: Telemetry observation   Lequita Halt MD [facility] Pager [phone]  09/07/2022, 3:08 PM

## 2022-09-07 NOTE — ED Provider Notes (Signed)
[facility] EMERGENCY DEPARTMENT Provider Note   CSN: [id number] Arrival date & time: 09/07/22  1016     History  Chief Complaint  Patient presents with   Isaac Stokes    Isaac Stokes is a 72 y.o. male.  The history is provided by the patient, the EMS personnel and medical records. No language interpreter was used.  Fall     72 year old male with a history of prior stroke currently on Plavix, aortic arch atherosclerosis brought here via EMS from home as a level 2 trauma due to fall on blood thinners.  [employer] Whe[employer] came by patient's home earlier today and patient went to the door to greet them.  Upon opening the door, he had a syncopal episode and fell down to the ground striking his forehead against the ground.  He has a transient loss of consciousness but was alert and oriented when EMS arrived.  He does not have any significant complaints.  Patient denies having headache, neck pain, chest pain, trouble breathing, abdominal pain, back pain, focal numbness or focal weakness or urinary symptoms.  States he felt fine prior to this incident.  He is currently under hospice care.  Home Medications Prior to Admission medications   Medication Sig Start Date End Date Taking? Authorizing Provider  atorvastatin (LIPITOR) 40 MG tablet Take 40 mg by mouth daily as needed. Patient not taking: Reported on 09/20/2021 06/16/21   [provider]  atorvastatin (LIPITOR) 40 MG tablet Take 1 tablet by mouth daily.    [provider]  atorvastatin (LIPITOR) 80 MG tablet Take 1 tablet (80 mg total) by mouth daily. Patient not taking: Reported on 09/20/2021 03/30/21   Joseph Art, DO  ciclopirox [facility]) 8 % solution Apply 1 application topically at bedtime. Apply over nail and surrounding skin. Apply daily over previous coat. After seven (7) days, may remove with alcohol and continue cycle.    [provider]  clopidogrel (PLAVIX) 75 MG tablet Take 1 tablet (75  mg total) by mouth daily. 04/29/21   Levert Feinstein, MD      Allergies    Patient has no known allergies.    Review of Systems   Review of Systems  All other systems reviewed and are negative.   Physical Exam Updated Vital Signs BP 134/72   Pulse 63   Temp (!) 97 F (36.1 C) (Tympanic)   Resp 13   SpO2 100%  Physical Exam Vitals and nursing note reviewed.  Constitutional:      General: He is not in acute distress.    Appearance: He is well-developed.  HENT:     Head: Normocephalic.     Comments: Skin tear noted to left temporal region with some dried blood noted minimal tenderness to palpation.  No midface tenderness no Battle sign or raccoon's eyes no hemotympanum no septal hematoma no malocclusion. Eyes:     Extraocular Movements: Extraocular movements intact.     Conjunctiva/sclera: Conjunctivae normal.     Pupils: Pupils are equal, round, and reactive to light.  Neck:     Comments: Cervical spine collar in place.  No midline cervical spine tenderness. Cardiovascular:     Rate and Rhythm: Normal rate and regular rhythm.     Pulses: Normal pulses.     Heart sounds: Normal heart sounds.  Pulmonary:     Effort: Pulmonary effort is normal.     Breath sounds: Normal breath sounds. No wheezing, rhonchi or rales.  Abdominal:  Palpations: Abdomen is soft.     Tenderness: There is no abdominal tenderness.  Musculoskeletal:     Cervical back: Normal range of motion and neck supple.     Comments: 5/5 strength to all 4 extremities  Skin:    Findings: No rash.  Neurological:     Mental Status: He is alert and oriented to person, place, and time.  Psychiatric:        Mood and Affect: Mood normal.     ED Results / Procedures / Treatments   Labs (all labs ordered are listed, but only abnormal results are displayed) Labs Reviewed  COMPREHENSIVE METABOLIC PANEL - Abnormal; Notable for the following components:      Result Value   Creatinine, Ser 1.86 (*)    Calcium 8.5 (*)     Total Protein 6.3 (*)    Albumin 3.2 (*)    GFR, Estimated 38 (*)    All other components within normal limits  CBC - Abnormal; Notable for the following components:   RBC 3.52 (*)    Hemoglobin 10.7 (*)    HCT 33.9 (*)    All other components within normal limits  I-STAT CHEM 8, ED - Abnormal; Notable for the following components:   BUN 24 (*)    Creatinine, Ser 2.00 (*)    Calcium, Ion 1.02 (*)    Hemoglobin 10.9 (*)    HCT 32.0 (*)    All other components within normal limits  ETHANOL  LACTIC ACID, PLASMA  PROTIME-INR  URINALYSIS, ROUTINE W REFLEX MICROSCOPIC  SAMPLE TO BLOOD BANK  TROPONIN I (HIGH SENSITIVITY)    EKG EKG Interpretation  Date/Time:  Wednesday September 07 2022 10:39:42 EST Ventricular Rate:  53 PR Interval:    QRS Duration: 108 QT Interval:  447 QTC Calculation: 420 R Axis:   -37 Text Interpretation: sinus bradycardia Left axis deviation  Confirmed by Godfrey Pick ([id number]) on 09/07/2022 11:59:11 AM  Radiology CT CERVICAL SPINE WO CONTRAST  Result Date: 09/07/2022 CLINICAL DATA:  Poly trauma, blunt. Syncopal episode with head injury. EXAM: CT CERVICAL SPINE WITHOUT CONTRAST TECHNIQUE: Multidetector CT imaging of the cervical spine was performed without intravenous contrast. Multiplanar CT image reconstructions were also generated. RADIATION DOSE REDUCTION: This exam was performed according to the departmental dose-optimization program which includes automated exposure control, adjustment of the mA and/or kV according to patient size and/or use of iterative reconstruction technique. COMPARISON:  CT of the cervical spine 09/01/2022. FINDINGS: Alignment: No significant listhesis is present. Cervical lordosis is stable. Skull base and vertebrae: The craniocervical junction is normal. Upper cervical spine is within normal limits. Marrow signal is unremarkable. Vertebral body heights are stable. No acute fractures are present. Soft tissues and spinal canal: No  prevertebral fluid or swelling. No visible canal hematoma. Disc levels: Multilevel degenerative changes are again noted in the cervical spine, most significant disease is present at C5-6. Upper chest: The lung apices are clear. Centrilobular emphysematous changes are noted. IMPRESSION: 1. No acute fracture or traumatic subluxation. 2. Multilevel degenerative changes of the cervical spine are again noted, most significant at C5-6. 3.  Emphysema (ICD10-J43.9). Electronically Signed   By: San Morelle M.D.   On: 09/07/2022 11:37   CT HEAD WO CONTRAST  Result Date: 09/07/2022 CLINICAL DATA:  Head trauma, moderate to severe. Witnessed syncopal episode. EXAM: CT HEAD WITHOUT CONTRAST TECHNIQUE: Contiguous axial images were obtained from the base of the skull through the vertex without intravenous contrast. RADIATION DOSE REDUCTION: This exam  was performed according to the departmental dose-optimization program which includes automated exposure control, adjustment of the mA and/or kV according to patient size and/or use of iterative reconstruction technique. COMPARISON:  CT head without contrast 09/01/2022 FINDINGS: Brain: Acute subcortical hemorrhage is present in the high anterior left frontal lobe measuring 14 x 14 x 8 mm (AP x CC x TR). Multiple other smaller subcortical hemorrhages are present. A smaller cortical or subcortical based hemorrhage is present in the anterior left frontal lobe on axial image 24 of series 3. A focal hyperdense focus is present in the posteromedial right occipital lobe measuring 4 mm. Multiple hyperdense small foci are present in the anterior right frontal lobe. The largest measures 2 mm on sagittal image 16 of series 6. No significant extra-axial collections are present. 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 A remote lacunar infarct is present in the lateral right thalamus. No acute ischemic infarcts are  present. Brainstem and cerebellum are otherwise normal. Vascular: Atherosclerotic calcifications are present in the cavernous internal carotid arteries and left vertebral artery without a hyperdense vessel. Skull: Left frontal scalp soft tissue swelling extends to the lateral orbit. No underlying fracture or foreign body is present. Sinuses/Orbits: The paranasal sinuses and mastoid air cells are clear. The globes and orbits are within normal limits. Other: IMPRESSION: 1. Multiple cortical and subcortical hemorrhages compatible with acute trauma. The largest is an acute subcortical hemorrhage in the high anterior left frontal lobe measuring 14 x 14 x 8 mm. Given the location and multiple lesions, these likely represent shear injuries. 2. 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 3. Remote lacunar infarct in the lateral right thalamus. 4. Left frontal scalp soft tissue swelling extends to the lateral orbit. No underlying fracture or foreign body is present. Critical Value/emergent results were called by telephone at the time of interpretation on 09/07/2022 at 11:34 am to provider [facility] , who verbally acknowledged these results. Electronically Signed   By: San Morelle M.D.   On: 09/07/2022 11:34   DG Chest 2 View  Result Date: 09/07/2022 CLINICAL DATA:  Fall.  Syncope EXAM: CHEST - 2 VIEW COMPARISON:  One view x-ray 09/01/2022.  Older exams as well. FINDINGS: Overlapping cardiac leads. No consolidation, pneumothorax or effusion. No edema. Calcified tortuous aorta. Degenerative changes of the spine on lateral view. IMPRESSION: No acute cardiopulmonary disease. Electronically Signed   By: Jill Side M.D.   On: 09/07/2022 11:09    Procedures .Critical Care  Performed by: Domenic Moras, PA-C Authorized by: Domenic Moras, PA-C   Critical care provider statement:    Critical care time (minutes):  50   Critical care was time spent personally by me on  the following activities:  Development of treatment plan with patient or surrogate, discussions with consultants, evaluation of patient's response to treatment, examination of patient, ordering and review of laboratory studies, ordering and review of radiographic studies, ordering and performing treatments and interventions, pulse oximetry, re-evaluation of patient's condition and review of old charts     Medications Ordered in ED Medications  sodium chloride 0.9 % bolus 500 mL (has no administration in time range)  morphine (PF) 4 MG/ML injection 4 mg (has no administration in time range)  sodium chloride 0.9 % bolus 500 mL (0 mLs Intravenous Stopped 09/07/22 1234)    ED Course/ Medical Decision Making/ A&P  Medical Decision Making Amount and/or Complexity of Data Reviewed Labs: ordered. Radiology: ordered. ECG/medicine tests: ordered.  Risk Prescription drug management.   BP 134/72   Pulse 63   Temp (!) 97 F (36.1 C) (Tympanic)   Resp 13   SpO2 100%   79:68 AM 72 year old male with a history of prior stroke currently on Plavix, aortic arch atherosclerosis brought here via EMS from home as a level 2 trauma due to fall on blood thinners.  Meals on Wheels came by patient's home earlier today and patient went to the door to greet them.  Upon opening the door, he had a syncopal episode and fell down to the ground striking his forehead against the ground.  He has a transient loss of consciousness but was alert and oriented when EMS arrived.  He does not have any significant complaints.  Patient denies having headache, neck pain, chest pain, trouble breathing, abdominal pain, back pain, focal numbness or focal weakness or urinary symptoms.  States he felt fine prior to this incident.  He is currently under hospice care.  On exam this is an elderly male wearing a c-collar laying in bed appears to be in no acute discomfort.  Initial visual examination is remarkable  for skin tear to his left parietal/forehead region without active bleeding.  It is mildly tender to palpation.  No hemotympanum, no septal hematoma, no malocclusion and no midface tenderness.  He does not have any significant midline spine tenderness on palpation.  He is able to move all 4 extremities with equal strength throughout.  He is mentating appropriately alert and oriented x 4.  No other signs of injury noted on exam.  Given the patient is currently on Plavix, will obtain head and cervical spine CT for further assessment.  Workup initiated.  I suspect patient may have had a transient orthostatic hypotension as he got up to open the door and had a syncopal episode.  Patient is a 72 yo male who presented to the ED today with syncope detailed above.      Handoff received from EMS.  Additional history discussed with patient's family/caregivers.  External chart has been reviewed including notes from [facility]. Patient's presentation is complicated by their history of prior stroke on antiplatelets.  Patient placed on continuous vitals and telemetry monitoring while in ED which was reviewed periodically.    Complete initial physical exam performed, notably the patient  was A&Ox4.  Has signs of scalp trauma.      Reviewed and confirmed nursing documentation for past medical history, family history, social history.     Initial Assessment:   With the patient's presentation of syncope, most likely diagnosis is intracranial hemorrhage 2/2 fall. Other diagnoses were considered including (but not limited to) stroke, seizure. These are considered less likely due to history of present illness and physical exam findings.   This is most consistent with an acute life/limb threatening illness complicated by underlying chronic conditions.   Initial Plan:  Stabilization of Cspine, level 2 trauma work up Screening labs including CBC and Metabolic panel to evaluate for infectious or metabolic  etiology of disease.  Urinalysis with reflex culture ordered to evaluate for UTI or relevant urologic/nephrologic pathology. UA pending CXR to evaluate for structural/infectious intrathoracic pathology.  EKG to evaluate for cardiac pathology. Head and Cspine CT to assess for potential hemorrhage or skull fx Objective evaluation as below reviewed with plan for close reassessment   Initial Study Results:   Laboratory  All laboratory  results reviewed without evidence of clinically relevant pathology.   Exceptions include: renal impairment with Cr 1.86.  IVF given.     EKG EKG was reviewed independently. Rate, rhythm, axis, intervals all examined and without medically relevant abnormality.  Initially documented as 3rd degree heart block, but repeat EKG show sinus rhythm. ST segments without concerns for elevations.     Radiology  All images reviewed independently. Intracranial hemorrhage from Head CT. Agree with radiology report at this time.       Consults:  Case discussed with neurosurgeon Dr. Yetta Barre who recommend hold off Plavix and monitor.   Appreciate consultation from [facility] Dr. Chipper Herb who agrees to admit pt for syncope work up and close monitoring of intracranial hemorrhage   Final Assessment and Plan:        Clinical Impression:  Intracranial hemorrhage Syncope dehydration        Final Clinical Impression(s) / ED Diagnoses Final diagnoses:  Intracranial hemorrhage following injury, with loss of consciousness, initial encounter (HCC)  Laceration of forehead, initial encounter  AKI (acute kidney injury) [facility])    Rx / DC Orders ED Discharge Orders     None         Fayrene Helper, PA-C 09/07/22 1341    Gloris Manchester, MD 09/07/22 1623

## 2022-09-07 NOTE — ED Notes (Signed)
Brother -- Isaac Stokes --- [phone] Gertie Fey, PA has spoken with brother and updated.

## 2022-09-07 NOTE — Progress Notes (Signed)
Patient ID: Isaac Stokes, male   DOB: 04-Nov-1950, 71 y.o.   MRN: [id number] Was called by the ED PA to look at the images on the 72 year old gentleman with a syncopal episode.  Admitted to medicine for syncopal workup.  I have reviewed his CT scan of the head independently and also looked at the report.  There are small shear hemorrhages and a left frontal contusion without mass effect or shift.  Basal cisterns are open.  No indication for any type of surgery.  His antiplatelet agents for about 5 days, avoid anticoagulation for about 3 days assuming his head CT remains stable, and repeat his head CT tomorrow morning or before that if there is a change in mental status.

## 2022-09-07 NOTE — Consult Note (Signed)
Reason for Consult:hemorrhagic contusion Referring Physician: EDP  KASEM MOZER is an 72 y.o. male.   HPI:  72 year old male who presented to the ED after sustaining a fall today and last Thursday. 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 Patient is amnestic to the event. Reports some mild frontal headaches but no NV or dizziness.   Past Medical History:  Diagnosis Date   TIA (transient ischemic attack) 03/25/2021   3 this summer of 2022    Past Surgical History:  Procedure Laterality Date   BUBBLE STUDY  07/29/2021   Procedure: BUBBLE STUDY;  Surgeon: Thayer Headings, MD;  Location: [facility];  Service: Cardiovascular;;   TEE WITHOUT CARDIOVERSION N/A 07/29/2021   Procedure: TRANSESOPHAGEAL ECHOCARDIOGRAM (TEE);  Surgeon: Acie Fredrickson Wonda Cheng, MD;  Location: [facility] ENDOSCOPY;  Service: Cardiovascular;  Laterality: N/A;    No Known Allergies  Social History   Tobacco Use   Smoking status: Light Smoker    Packs/day: 0.25    Types: Cigarettes   Smokeless tobacco: Never  Substance Use Topics   Alcohol use: Never    No family history on file.   Review of Systems  Positive ROS: as above  All other systems have been reviewed and were otherwise negative with the exception of those mentioned in the HPI and as above.  Objective: Vital signs in last 24 hours: Temp:  [97 F (36.1 C)-97.8 F (36.6 C)] 97.8 F (36.6 C) (01/10 1324) Pulse Rate:  [51-64] 64 (01/10 1324) Resp:  [13-21] 16 (01/10 1324) BP: (134-151)/(72-84) 137/84 (01/10 1324) SpO2:  [98 %-100 %] 98 % (01/10 1324) Weight:  [74.8 kg] 74.8 kg (01/10 1032)  General Appearance: Alert, cooperative, no distress, appears stated age Head: Normocephalic, without obvious abnormality, atraumatic Eyes: PERRL, conjunctiva/corneas clear, EOM's intact, fundi benign, both eyes      Back: Symmetric, no curvature, ROM normal, no CVA tenderness Lungs:, respirations unlabored Heart: Regular rate and  rhythm Pulses: 2+ and symmetric all extremities Skin: Skin color, texture, turgor normal, no rashes or lesions  NEUROLOGIC:   Mental status: A&O x4, no aphasia, good attention span, Memory and fund of knowledge Motor Exam - grossly normal, normal tone and bulk Sensory Exam - grossly normal Reflexes: symmetric, no pathologic reflexes, No Hoffman's, No clonus Coordination - grossly normal Gait - not tested Balance - not tested Cranial Nerves: I: smell Not tested  II: visual acuity  OS: na    OD: na  II: visual fields Full to confrontation  II: pupils Equal, round, reactive to light  III,VII: ptosis None  III,IV,VI: extraocular muscles  Full ROM  V: mastication Normal  V: facial light touch sensation  Normal  V,VII: corneal reflex  Present  VII: facial muscle function - upper  Normal  VII: facial muscle function - lower Normal  VIII: hearing Not tested  IX: soft palate elevation  Normal  IX,X: gag reflex Present  XI: trapezius strength  5/5  XI: sternocleidomastoid strength 5/5  XI: neck flexion strength  5/5  XII: tongue strength  Normal    Data Review Lab Results  Component Value Date   WBC 4.8 09/07/2022   HGB 10.9 (L) 09/07/2022   HCT 32.0 (L) 09/07/2022   MCV 96.3 09/07/2022   PLT 177 09/07/2022   Lab Results  Component Value Date   NA 138 09/07/2022   K 4.3 09/07/2022   CL 107 09/07/2022   CO2 22 09/07/2022  BUN 24 (H) 09/07/2022   CREATININE 2.00 (H) 09/07/2022   GLUCOSE 88 09/07/2022   Lab Results  Component Value Date   INR 1.1 09/07/2022    Radiology: CT CERVICAL SPINE WO CONTRAST  Result Date: 09/07/2022 CLINICAL DATA:  Poly trauma, blunt. Syncopal episode with head injury. EXAM: CT CERVICAL SPINE WITHOUT CONTRAST TECHNIQUE: Multidetector CT imaging of the cervical spine was performed without intravenous contrast. Multiplanar CT image reconstructions were also generated. RADIATION DOSE REDUCTION: This exam was performed according to the  departmental dose-optimization program which includes automated exposure control, adjustment of the mA and/or kV according to patient size and/or use of iterative reconstruction technique. COMPARISON:  CT of the cervical spine 09/01/2022. FINDINGS: Alignment: No significant listhesis is present. Cervical lordosis is stable. Skull base and vertebrae: The craniocervical junction is normal. Upper cervical spine is within normal limits. Marrow signal is unremarkable. Vertebral body heights are stable. No acute fractures are present. Soft tissues and spinal canal: No prevertebral fluid or swelling. No visible canal hematoma. Disc levels: Multilevel degenerative changes are again noted in the cervical spine, most significant disease is present at C5-6. Upper chest: The lung apices are clear. Centrilobular emphysematous changes are noted. IMPRESSION: 1. No acute fracture or traumatic subluxation. 2. Multilevel degenerative changes of the cervical spine are again noted, most significant at C5-6. 3.  Emphysema (ICD10-J43.9). Electronically Signed   By: Marin Roberts M.D.   On: 09/07/2022 11:37   CT HEAD WO CONTRAST  Result Date: 09/07/2022 CLINICAL DATA:  Head trauma, moderate to severe. Witnessed syncopal episode. EXAM: CT HEAD WITHOUT CONTRAST TECHNIQUE: Contiguous axial images were obtained from the base of the skull through the vertex without intravenous contrast. RADIATION DOSE REDUCTION: This exam was performed according to the departmental dose-optimization program which includes automated exposure control, adjustment of the mA and/or kV according to patient size and/or use of iterative reconstruction technique. COMPARISON:  CT head without contrast 09/01/2022 FINDINGS: Brain: Acute subcortical hemorrhage is present in the high anterior left frontal lobe measuring 14 x 14 x 8 mm (AP x CC x TR). Multiple other smaller subcortical hemorrhages are present. A smaller cortical or subcortical based hemorrhage is  present in the anterior left frontal lobe on axial image 24 of series 3. A focal hyperdense focus is present in the posteromedial right occipital lobe measuring 4 mm. Multiple hyperdense small foci are present in the anterior right frontal lobe. The largest measures 2 mm on sagittal image 16 of series 6. No significant extra-axial collections are present. 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 A remote lacunar infarct is present in the lateral right thalamus. No acute ischemic infarcts are present. Brainstem and cerebellum are otherwise normal. Vascular: Atherosclerotic calcifications are present in the cavernous internal carotid arteries and left vertebral artery without a hyperdense vessel. Skull: Left frontal scalp soft tissue swelling extends to the lateral orbit. No underlying fracture or foreign body is present. Sinuses/Orbits: The paranasal sinuses and mastoid air cells are clear. The globes and orbits are within normal limits. Other: IMPRESSION: 1. Multiple cortical and subcortical hemorrhages compatible with acute trauma. The largest is an acute subcortical hemorrhage in the high anterior left frontal lobe measuring 14 x 14 x 8 mm. Given the location and multiple lesions, these likely represent shear injuries. 2. 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 3. Remote lacunar infarct in the lateral right thalamus.  4. Left frontal scalp soft tissue swelling extends to the lateral orbit. No underlying fracture or foreign body is present. Critical Value/emergent results were called by telephone at the time of interpretation on 09/07/2022 at 11:34 am to provider [facility] , who verbally acknowledged these results. Electronically Signed   By: San Morelle M.D.   On: 09/07/2022 11:34   DG Chest 2 View  Result Date: 09/07/2022 CLINICAL DATA:  Fall.  Syncope EXAM: CHEST - 2 VIEW  COMPARISON:  One view x-ray 09/01/2022.  Older exams as well. FINDINGS: Overlapping cardiac leads. No consolidation, pneumothorax or effusion. No edema. Calcified tortuous aorta. Degenerative changes of the spine on lateral view. IMPRESSION: No acute cardiopulmonary disease. Electronically Signed   By: Jill Side M.D.   On: 09/07/2022 11:09     Assessment/Plan: 72 year old male presented to the ED after a syncopal episode. CT head shows hemorrhagic contusions bilaterally in the frontal lobes. No surgical intervention warranted at this time. Repeat head CT in the morning. Hold plavix for now. Discussed case with Dr. Pecola Leisure Avarey Yaeger 09/07/2022 3:59 PM

## 2022-09-07 NOTE — ED Notes (Signed)
ED TO INPATIENT HANDOFF REPORT  ED Nurse Name and Phone #:  Purcell Mouton RN/ [phone]  S Name/Age/Gender Isaac Stokes 72 y.o. male Room/Bed: 041C/041C  Code Status   Code Status: DNR  Home/SNF/Other Home Patient oriented to: self, place, time, and situation Is this baseline? Yes   Triage Complete: Triage complete  Chief Complaint Syncope [R55]  Triage Note Pt bib ems from home where pt with witnessed syncopal episode. Pt alert and oriented. VSS with ems. Arrives in [place]. Pt on plavix. Abrasion to L head.    Allergies No Known Allergies  Level of Care/Admitting Diagnosis ED Disposition     ED Disposition  Admit   Condition  --   Comment  Hospital Area: [facility] [[id number]]  Level of Care: Telemetry Medical [104]  May place patient in observation at [facility] or [facility] if equivalent level of care is available:: No  Covid Evaluation: Asymptomatic - no recent exposure (last 10 days) testing not required  Diagnosis: Syncope [206001]  Admitting Physician: Emeline General [[id number]]  Attending Physician: Emeline General [[id number]]          B Medical/Surgery History Past Medical History:  Diagnosis Date   TIA (transient ischemic attack) 03/25/2021   3 this summer of 2022   Past Surgical History:  Procedure Laterality Date   BUBBLE STUDY  07/29/2021   Procedure: BUBBLE STUDY;  Surgeon: [name], MD;  Location: [facility] ENDOSCOPY;  Service: Cardiovascular;;   TEE WITHOUT CARDIOVERSION N/A 07/29/2021   Procedure: TRANSESOPHAGEAL ECHOCARDIOGRAM (TEE);  Surgeon: [name], MD;  Location: [facility] ENDOSCOPY;  Service: Cardiovascular;  Laterality: N/A;     A IV Location/Drains/Wounds Patient Lines/Drains/Airways Status     Active Line/Drains/Airways     Name Placement date Placement time Site Days   Peripheral IV 09/07/22 18 G Anterior;Distal;Left;Upper Arm 09/07/22  1032  Arm  less than 1            Intake/Output Last 24  hours  Intake/Output Summary (Last 24 hours) at 09/07/2022 2008 Last data filed at 09/07/2022 1736 Gross per 24 hour  Intake 503 ml  Output 325 ml  Net 178 ml    Labs/Imaging Results for orders placed or performed during the hospital encounter of 09/07/22 (from the past 48 hour(s))  Comprehensive metabolic panel     Status: Abnormal   Collection Time: 09/07/22 10:29 AM  Result Value Ref Range   Sodium 137 135 - 145 mmol/L   Potassium 4.3 3.5 - 5.1 mmol/L   Chloride 106 98 - 111 mmol/L   CO2 22 22 - 32 mmol/L   Glucose, Bld 88 70 - 99 mg/dL    Comment: Glucose reference range applies only to samples taken after fasting for at least 8 hours.   BUN 21 8 - 23 mg/dL   Creatinine, Ser 3.55 (H) 0.61 - 1.24 mg/dL   Calcium 8.5 (L) 8.9 - 10.3 mg/dL   Total Protein 6.3 (L) 6.5 - 8.1 g/dL   Albumin 3.2 (L) 3.5 - 5.0 g/dL   AST 17 15 - 41 U/L   ALT 9 0 - 44 U/L   Alkaline Phosphatase 54 38 - 126 U/L   Total Bilirubin 0.5 0.3 - 1.2 mg/dL   GFR, Estimated 38 (L) >60 mL/min    Comment: (NOTE) Calculated using the CKD-EPI Creatinine Equation (2021)    Anion gap 9 5 - 15    Comment: Performed at [facility], [address], [place], [place] [address]  CBC     Status: Abnormal   Collection Time: 09/07/22 10:29 AM  Result Value Ref Range   WBC 4.8 4.0 - 10.5 K/uL   RBC 3.52 (L) 4.22 - 5.81 MIL/uL   Hemoglobin 10.7 (L) 13.0 - 17.0 g/dL   HCT 00.1 (L) 74.9 - 44.9 %   MCV 96.3 80.0 - 100.0 fL   MCH 30.4 26.0 - 34.0 pg   MCHC 31.6 30.0 - 36.0 g/dL   RDW 67.5 91.6 - 38.4 %   Platelets 177 150 - 400 K/uL   nRBC 0.0 0.0 - 0.2 %    Comment: Performed at [facility], [address] [place], [place] [address]  Ethanol     Status: None   Collection Time: 09/07/22 10:29 AM  Result Value Ref Range   Alcohol, Ethyl (B) <10 <10 mg/dL    Comment: (NOTE) Lowest detectable limit for serum alcohol is 10 mg/dL.  For medical purposes only. Performed at [facility], [address], [place], [place] [address]   Lactic acid, plasma     Status: None   Collection Time: 09/07/22 10:29 AM  Result Value Ref Range   Lactic Acid, Venous 1.2 0.5 - 1.9 mmol/L    Comment: Performed at [facility], [address], [place], [place] [address]  Protime-INR     Status: None   Collection Time: 09/07/22 10:29 AM  Result Value Ref Range   Prothrombin Time 14.5 11.4 - 15.2 seconds   INR 1.1 0.8 - 1.2    Comment: (NOTE) INR goal varies based on device and disease states. Performed at [facility], [address], [place], [place] [address]   Sample to Blood Bank     Status: None   Collection Time: 09/07/22 10:29 AM  Result Value Ref Range   Blood Bank Specimen SAMPLE AVAILABLE FOR TESTING    Sample Expiration      09/08/2022,2359 Performed at [facility], [address], [place], [place] [address]   Troponin I (High Sensitivity)     Status: None   Collection Time: 09/07/22 10:29 AM  Result Value Ref Range   Troponin I (High Sensitivity) 3 <18 ng/L    Comment: (NOTE) Elevated high sensitivity troponin I (hsTnI) values and significant  changes across serial measurements may suggest ACS but many other  chronic and acute conditions are known to elevate hsTnI results.  Refer to the "Links" section for chest pain algorithms and additional  guidance. Performed at [facility], [address] [place], [place] [address]   I-Stat Chem 8, ED     Status: Abnormal   Collection Time: 09/07/22 10:43 AM  Result Value Ref Range   Sodium 138 135 - 145 mmol/L   Potassium 4.3 3.5 - 5.1 mmol/L   Chloride 107 98 - 111 mmol/L   BUN 24 (H) 8 - 23 mg/dL   Creatinine, Ser 3.33 (H) 0.61 - 1.24 mg/dL   Glucose, Bld 88 70 - 99 mg/dL    Comment: Glucose reference range applies only to samples taken after fasting for at least 8 hours.   Calcium, Ion 1.02 (L) 1.15 - 1.40 mmol/L   TCO2 23 22 - 32 mmol/L   Hemoglobin 10.9 (L) 13.0 - 17.0 g/dL   HCT 54.5 (L) 62.5 -  52.0 %  Urinalysis, Routine w reflex microscopic Urine, Clean Catch     Status: Abnormal  Collection Time: 09/07/22  1:22 PM  Result Value Ref Range   Color, Urine STRAW (A) YELLOW   APPearance CLEAR CLEAR   Specific Gravity, Urine 1.008 1.005 - 1.030   pH 6.0 5.0 - 8.0   Glucose, UA NEGATIVE NEGATIVE mg/dL   Hgb urine dipstick NEGATIVE NEGATIVE   Bilirubin Urine NEGATIVE NEGATIVE   Ketones, ur NEGATIVE NEGATIVE mg/dL   Protein, ur NEGATIVE NEGATIVE mg/dL   Nitrite NEGATIVE NEGATIVE   Leukocytes,Ua NEGATIVE NEGATIVE    Comment: Performed at [facility] [address], [place], [place] [address]  Troponin I (High Sensitivity)     Status: None   Collection Time: 09/07/22  2:20 PM  Result Value Ref Range   Troponin I (High Sensitivity) 4 <18 ng/L    Comment: (NOTE) Elevated high sensitivity troponin I (hsTnI) values and significant  changes across serial measurements may suggest ACS but many other  chronic and acute conditions are known to elevate hsTnI results.  Refer to the "Links" section for chest pain algorithms and additional  guidance. Performed at [facility], [address], [place], [place] [address]   TSH     Status: None   Collection Time: 09/07/22  2:30 PM  Result Value Ref Range   TSH 1.197 0.350 - 4.500 uIU/mL    Comment: Performed by a 3rd Generation assay with a functional sensitivity of <=0.01 uIU/mL. Performed at [facility], [address] [place], [place] [address]    US AORTA  Result Date: 09/07/2022 CLINICAL DATA:  Aortic aneurysm. EXAM: ULTRASOUND OF ABDOMINAL AORTA TECHNIQUE: Ultrasound examination of the abdominal aorta and proximal common iliac arteries was performed to evaluate for aneurysm. Additional color and Doppler images of the distal aorta were obtained to document patency. COMPARISON:  CT lumbar spine 09/01/2022. CTA abdomen and pelvis 09/30/2021. FINDINGS: Abdominal aortic measurements as follows: Proximal:  2.9 x 2.1 cm  Mid: Obscured by bowel gas Distal:  6.1 x 9.0 cm with prominent mural thrombus Patent: Yes, peak systolic velocity is 58 cm/s Right common iliac artery: 1.5 x 1.3 cm Left common iliac artery: 1.7 x 1.4 cm IMPRESSION: 9 cm abdominal aortic aneurysm, increased in size from 2023. Recommend referral to a vascular specialist. Reference: J Am Coll Radiol 3419;37:[id number]. Electronically Signed   By: Logan Bores M.D.   On: 09/07/2022 17:01   CT CERVICAL SPINE WO CONTRAST  Result Date: 09/07/2022 CLINICAL DATA:  Poly trauma, blunt. Syncopal episode with head injury. EXAM: CT CERVICAL SPINE WITHOUT CONTRAST TECHNIQUE: Multidetector CT imaging of the cervical spine was performed without intravenous contrast. Multiplanar CT image reconstructions were also generated. RADIATION DOSE REDUCTION: This exam was performed according to the departmental dose-optimization program which includes automated exposure control, adjustment of the mA and/or kV according to patient size and/or use of iterative reconstruction technique. COMPARISON:  CT of the cervical spine 09/01/2022. FINDINGS: Alignment: No significant listhesis is present. Cervical lordosis is stable. Skull base and vertebrae: The craniocervical junction is normal. Upper cervical spine is within normal limits. Marrow signal is unremarkable. Vertebral body heights are stable. No acute fractures are present. Soft tissues and spinal canal: No prevertebral fluid or swelling. No visible canal hematoma. Disc levels: Multilevel degenerative changes are again noted in the cervical spine, most significant disease is present at C5-6. Upper chest: The lung apices are clear. Centrilobular emphysematous changes are noted. IMPRESSION: 1. No acute fracture or traumatic subluxation. 2. Multilevel degenerative changes of the cervical spine are  again noted, most significant at C5-6. 3.  Emphysema (ICD10-J43.9). Electronically Signed   By: San Morelle M.D.   On: 09/07/2022 11:37    CT HEAD WO CONTRAST  Result Date: 09/07/2022 CLINICAL DATA:  Head trauma, moderate to severe. Witnessed syncopal episode. EXAM: CT HEAD WITHOUT CONTRAST TECHNIQUE: Contiguous axial images were obtained from the base of the skull through the vertex without intravenous contrast. RADIATION DOSE REDUCTION: This exam was performed according to the departmental dose-optimization program which includes automated exposure control, adjustment of the mA and/or kV according to patient size and/or use of iterative reconstruction technique. COMPARISON:  CT head without contrast 09/01/2022 FINDINGS: Brain: Acute subcortical hemorrhage is present in the high anterior left frontal lobe measuring 14 x 14 x 8 mm (AP x CC x TR). Multiple other smaller subcortical hemorrhages are present. A smaller cortical or subcortical based hemorrhage is present in the anterior left frontal lobe on axial image 24 of series 3. A focal hyperdense focus is present in the posteromedial right occipital lobe measuring 4 mm. Multiple hyperdense small foci are present in the anterior right frontal lobe. The largest measures 2 mm on sagittal image 16 of series 6. No significant extra-axial collections are present. 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 A remote lacunar infarct is present in the lateral right thalamus. No acute ischemic infarcts are present. Brainstem and cerebellum are otherwise normal. Vascular: Atherosclerotic calcifications are present in the cavernous internal carotid arteries and left vertebral artery without a hyperdense vessel. Skull: Left frontal scalp soft tissue swelling extends to the lateral orbit. No underlying fracture or foreign body is present. Sinuses/Orbits: The paranasal sinuses and mastoid air cells are clear. The globes and orbits are within normal limits. Other: IMPRESSION: 1. Multiple cortical and subcortical hemorrhages compatible with acute  trauma. The largest is an acute subcortical hemorrhage in the high anterior left frontal lobe measuring 14 x 14 x 8 mm. Given the location and multiple lesions, these likely represent shear injuries. 2. 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 3. Remote lacunar infarct in the lateral right thalamus. 4. Left frontal scalp soft tissue swelling extends to the lateral orbit. No underlying fracture or foreign body is present. Critical Value/emergent results were called by telephone at the time of interpretation on 09/07/2022 at 11:34 am to provider [facility] , who verbally acknowledged these results. Electronically Signed   By: San Morelle M.D.   On: 09/07/2022 11:34   DG Chest 2 View  Result Date: 09/07/2022 CLINICAL DATA:  Fall.  Syncope EXAM: CHEST - 2 VIEW COMPARISON:  One view x-ray 09/01/2022.  Older exams as well. FINDINGS: Overlapping cardiac leads. No consolidation, pneumothorax or effusion. No edema. Calcified tortuous aorta. Degenerative changes of the spine on lateral view. IMPRESSION: No acute cardiopulmonary disease. Electronically Signed   By: Jill Side M.D.   On: 09/07/2022 11:09    Pending Labs Unresulted Labs (From admission, onward)    None       Vitals/Pain Today's Vitals   09/07/22 1032 09/07/22 1033 09/07/22 1145 09/07/22 1324  BP:   (!) 151/83 137/84  Pulse:   (!) 51 64  Resp:   15 16  Temp:  (!) 97.4 F (36.3 C)  97.8 F (36.6 C)  TempSrc:  Oral  Oral  SpO2:   99% 98%  Weight: 74.8 kg     Height: 6' (1.829  m)     PainSc:        Isolation Precautions No active isolations  Medications Medications  atorvastatin (LIPITOR) tablet 40 mg (40 mg Oral Given 09/07/22 1733)  sodium chloride flush (NS) 0.9 % injection 3 mL (3 mLs Intravenous Given 09/07/22 1734)  acetaminophen (TYLENOL) tablet 650 mg (has no administration in time range)    Or  acetaminophen (TYLENOL) suppository 650 mg (has no  administration in time range)  oxyCODONE (Oxy IR/ROXICODONE) immediate release tablet 5 mg (has no administration in time range)  sodium chloride 0.9 % bolus 500 mL (0 mLs Intravenous Stopped 09/07/22 1234)  sodium chloride 0.9 % bolus 500 mL (0 mLs Intravenous Stopped 09/07/22 1421)  morphine (PF) 4 MG/ML injection 4 mg (4 mg Intravenous Given 09/07/22 1421)    Mobility walks with person assist     Focused Assessments Neuro Assessment Handoff:           Neuro Assessment: Exceptions to WDL A&Ox 4      If patient is a Neuro Trauma and patient is going to OR before floor call report to 4N Charge nurse: [phone] or [phone]   R Recommendations: See Admitting Provider Note  Report given to:   Additional Notes: Pt originally came in for a fall. He is alert and oriented x 4. His CT scan showed some subcortical hemorrhages and he does take blood thinners at home. He has a hx of a TIA. He uses a urinal at bedside and has an 18 G in the L upper arm. He also has a DNR and MOST form at bedside.

## 2022-09-08 ENCOUNTER — Observation Stay (HOSPITAL_COMMUNITY)

## 2022-09-08 DIAGNOSIS — S0181XA Laceration without foreign body of other part of head, initial encounter: Secondary | ICD-10-CM | POA: Diagnosis present

## 2022-09-08 DIAGNOSIS — S0636AA Traumatic hemorrhage of cerebrum, unspecified, with loss of consciousness status unknown, initial encounter: Secondary | ICD-10-CM | POA: Diagnosis present

## 2022-09-08 DIAGNOSIS — R001 Bradycardia, unspecified: Secondary | ICD-10-CM | POA: Diagnosis present

## 2022-09-08 DIAGNOSIS — S06359A Traumatic hemorrhage of left cerebrum with loss of consciousness of unspecified duration, initial encounter: Secondary | ICD-10-CM | POA: Diagnosis present

## 2022-09-08 DIAGNOSIS — F17210 Nicotine dependence, cigarettes, uncomplicated: Secondary | ICD-10-CM | POA: Diagnosis present

## 2022-09-08 DIAGNOSIS — N1832 Chronic kidney disease, stage 3b: Secondary | ICD-10-CM | POA: Diagnosis present

## 2022-09-08 DIAGNOSIS — I714 Abdominal aortic aneurysm, without rupture, unspecified: Secondary | ICD-10-CM | POA: Diagnosis present

## 2022-09-08 DIAGNOSIS — R296 Repeated falls: Secondary | ICD-10-CM | POA: Diagnosis present

## 2022-09-08 DIAGNOSIS — W19XXXA Unspecified fall, initial encounter: Secondary | ICD-10-CM | POA: Diagnosis present

## 2022-09-08 DIAGNOSIS — Q2112 Patent foramen ovale: Secondary | ICD-10-CM | POA: Diagnosis not present

## 2022-09-08 DIAGNOSIS — Z79899 Other long term (current) drug therapy: Secondary | ICD-10-CM | POA: Diagnosis not present

## 2022-09-08 DIAGNOSIS — I951 Orthostatic hypotension: Secondary | ICD-10-CM | POA: Diagnosis present

## 2022-09-08 DIAGNOSIS — Z66 Do not resuscitate: Secondary | ICD-10-CM | POA: Diagnosis present

## 2022-09-08 DIAGNOSIS — Z9989 Dependence on other enabling machines and devices: Secondary | ICD-10-CM | POA: Diagnosis not present

## 2022-09-08 DIAGNOSIS — Z23 Encounter for immunization: Secondary | ICD-10-CM | POA: Diagnosis present

## 2022-09-08 DIAGNOSIS — Z7902 Long term (current) use of antithrombotics/antiplatelets: Secondary | ICD-10-CM | POA: Diagnosis not present

## 2022-09-08 DIAGNOSIS — I700 Atherosclerosis of aorta: Secondary | ICD-10-CM | POA: Diagnosis present

## 2022-09-08 DIAGNOSIS — Z8673 Personal history of transient ischemic attack (TIA), and cerebral infarction without residual deficits: Secondary | ICD-10-CM | POA: Diagnosis not present

## 2022-09-08 DIAGNOSIS — R55 Syncope and collapse: Secondary | ICD-10-CM | POA: Diagnosis not present

## 2022-09-08 DIAGNOSIS — R413 Other amnesia: Secondary | ICD-10-CM | POA: Diagnosis present

## 2022-09-08 LAB — ECHOCARDIOGRAM COMPLETE
Area-P 1/2: 2.16 cm2
Height: 72 in
S' Lateral: 3.5 cm
Weight: 2331.58 oz

## 2022-09-08 LAB — GLUCOSE, CAPILLARY: Glucose-Capillary: 89 mg/dL (ref 70–99)

## 2022-09-08 MED ORDER — SODIUM CHLORIDE 0.9 % IV SOLN: INTRAVENOUS | Status: DC

## 2022-09-08 NOTE — Progress Notes (Signed)
Patient ID: Isaac Stokes, male   DOB: 12/17/1950, 72 y.o.   MRN: [id number] CTH unchanged, we will sign off, please call if we can be of assistance

## 2022-09-08 NOTE — TOC CAGE-AID Note (Signed)
Transition of Care [facility]) - CAGE-AID Screening   Patient Details  Name: Isaac Stokes MRN: [id number] Date of Birth: 1951/02/13    Elvina Sidle, RN Phone Number: [phone] 09/08/2022, 5:21 PM       CAGE-AID Screening:    Have You Ever Felt You Ought to Cut Down on Your Drinking or Drug Use?: No Have People Annoyed You By [employer] Your Drinking Or Drug Use?: No Have You Felt Bad Or Guilty About Your Drinking Or Drug Use?: No Have You Ever Had a Drink or Used Drugs First Thing In The Morning to Steady Your Nerves or to Get Rid of a Hangover?: No CAGE-AID Score: 0  Substance Abuse Education Offered: No (denies any drug or alcohol use)

## 2022-09-08 NOTE — Progress Notes (Signed)
  Echocardiogram 2D Echocardiogram has been performed.  Isaac Stokes 09/08/2022, 10:28 AM

## 2022-09-08 NOTE — Progress Notes (Addendum)
Isaac Stokes [facility]) Hospital Liaison Note    [name] is a current [facility] patient with a terminal diagnosis of Cerebrovascular disease. On the morning of 1.10, patient had a syncope episode causing him to fall and sustain an abrasion to the left side of his forehead. [name] was taken to [facility] ED where they did a CT of the patient's head showing left anterior frontal lobe subcortical hemorrhage, as well as other small subcortical hemorrhages. [name] reports that he has had a few lightheaded episodes, as well as a similar episode a few days ago that he came to the ED for and was evaluated and discharged home. Patient was admitted on 1.10. [facility] was notified on 1.11. Per ACC Dr. Karie Georges, this is a related hospital admission.   [name] is alert and pleasant. No family present at bedside. Report exchanged with RN. Patient has been having a drop in his orthostatic blood pressure, drops to 76/66 when standing, and reports a lot of dizziness. Will be started on IV fluids today. Called patient's brother via telephone to provide an update. Left a message.     [name] is inpatient appropriate due to requiring IV fluids for low blood pressures.   V/S: 112/73 bp, 98.6 Temp, 65 bpm, 20 RR, 98% on RA  I/O: 503/475  Labs:  Calcium: 8.5 (L) Albumin: 3.2 (L) Total Protein: 6.3 (L) GFR, Estimated: 38 (L) RBC: 3.52 (L) BUN: 24 (H) Creatinine: 2.00 (H) Calcium Ionized: 1.02 (L) Hemoglobin: 10.9 (L) HCT: 32.0 (L)  Diagnostics:  CT Cervical spine FINDINGS: Alignment: No significant listhesis is present. Cervical lordosis is stable.   Skull base and vertebrae: The craniocervical junction is normal. Upper cervical spine is within normal limits. Marrow signal is unremarkable. Vertebral body heights are stable. No acute fractures are present.   Soft tissues and spinal canal: No prevertebral fluid or swelling. No visible canal hematoma.   Disc levels: Multilevel  degenerative changes are again noted in the cervical spine, most significant disease is present at C5-6.   Upper chest: The lung apices are clear. Centrilobular emphysematous changes are noted.   IMPRESSION: 1. No acute fracture or traumatic subluxation. 2. Multilevel degenerative changes of the cervical spine are again noted, most significant at C5-6. 3.  Emphysema (ICD10-J43.9).  CT Head IMPRESSION: 1. Multiple cortical and subcortical hemorrhages compatible with acute trauma. The largest is an acute subcortical hemorrhage in the high anterior left frontal lobe measuring 14 x 14 x 8 mm. Given the location and multiple lesions, these likely represent shear injuries. 2. 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 3. Remote lacunar infarct in the lateral right thalamus. 4. Left frontal scalp soft tissue swelling extends to the lateral orbit. No underlying fracture or foreign body is present.  Chest X-ray  FINDINGS: Overlapping cardiac leads. No consolidation, pneumothorax or effusion. No edema. Calcified tortuous aorta. Degenerative changes of the spine on lateral view.   IMPRESSION: No acute cardiopulmonary disease.  IV/PRN:  0.9 % sodium chloride infusion Rate: 75 mL/hr Freq: Continuous Route: IV  sodium chloride 0.9 % bolus 500 mL Dose: 500 mL Freq:  Once Route: IV x2  ALPRAZolam (XANAX) tablet 0.5 mg Dose: 0.5 mg Freq: 3 times daily PRN Route: PO PRN Reason: anxiety x1  Problem list: Assessment and plan: Recurrent syncope Likely orthostatic in nature given history Monitor orthostatic vital signs.  Noted significant drop in orthostatic vital sign this morning from 120/84 on  lying down to 76/66 on standing up.  Associated with dizziness.  Started on normal saline at 75 mill per hour echocardiogram this morning with EF 55 to 60% Continue to monitor hemodynamics. Continue to monitor in telemetry. PTA not on BP  meds.   Intracranial hemorrhage Secondary to fall.   CT head showed left anterior frontal lobe subcortical hemorrhage 14 x 14 x 8 mm, and multiple other small subcortical hemorrhage.  Neurosurgery consulted who reviewed the imaging and recommended conservative management, repeat CT scan in 24 hours and Plavix for 5 days Repeat CT head this morning did not show any significant change or mass effect.   Chronic ambulatory dysfunction On roller walker for about 75-month Pending PT eval   CKD stage IIIb Creatinine stable at baseline close to 2. Recent Labs (within last 365 days)        Recent Labs    09/09/21 2325 09/01/22 1913 09/07/22 1029 09/07/22 1043  BUN 26* 24* 21 24*  CREATININE 1.63* 2.19* 1.86* 2.00*      Abdominal aortic aneurysm Large, indication for repair.  And has been following with vascular surgeon and [facility] and was offered AAA repair last year patient declined.  Patient is DNR. Last imaging was one year ago, order a aorta U/S   History of stroke in 2022  Positive for PFO Currently Plavix is on hold for 5 days  GOC: Patient is a DNR.    D/C planning: Ongoing.   Family: Called patient's brother to provide an update, left a message.  IDT: Updated  Should patient need ambulance transfer - Please use [facility]) as they contract this service for our active hospice patients.   Zigmund Gottron, RN [facility] Liaison  [phone]

## 2022-09-08 NOTE — Progress Notes (Signed)
PROGRESS NOTE  Isaac Stokes  DOB: June 05, 1951  PCP: [facility] AYT:[id number]  DOA: 09/07/2022  LOS: 0 days  Hospital Day: 2  Brief narrative: Isaac Stokes is a 72 y.o. 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 1/10, patient was brought to the ED from home after witnessed syncopal episode EMS noted abrasion on the left side of the head and brought to the ED on cervical collar.  Per report, in the last 1 to 2 weeks, patient has had multiple episodes of lightheadedness that occurs mostly on standing up. On 1/10, he woke up in the morning, rose up to go to the door and suddenly passed out.  He denies any premonition symptoms he hit the left side of his forehead.  He had similar episode 4 days ago for which she was brought to the ED.  CT scan of head was negative at that time and he was discharged home.  In the ED, heart rate was in 50s, blood pressure in 130s CT head showed left anterior frontal lobe subcortical hemorrhage 14 x 14 x 8 mm, and multiple other small subcortical hemorrhage.  Neurosurgery consulted who reviewed the imaging and recommended conservative management, repeat CT scan in 24 hours and Plavix for 5 days Admitted to [facility]  Subjective: Patient was seen and examined this morning.  Pleasant elderly Caucasian male.  Lying on bed.  Not in distress.  No family at bedside Overnight, heart rate in 60s, blood pressure in 110s Physical therapy evaluation was obtained.  Significant drop in orthostatic blood pressure  Assessment and plan: Recurrent syncope Likely orthostatic in nature given history Monitor orthostatic vital signs.  Noted significant drop in orthostatic vital sign this morning from 120/84 on lying down to 76/66 on standing up.  Associated with dizziness.  Started on normal saline at 75 mill per hour echocardiogram  this morning with EF 55 to 60% Continue to monitor hemodynamics. Continue to monitor in telemetry. PTA not on BP meds.  Intracranial hemorrhage Secondary to fall.   CT head showed left anterior frontal lobe subcortical hemorrhage 14 x 14 x 8 mm, and multiple other small subcortical hemorrhage.  Neurosurgery consulted who reviewed the imaging and recommended conservative management, repeat CT scan in 24 hours and Plavix for 5 days Repeat CT head this morning did not show any significant change or mass effect.  Chronic ambulatory dysfunction On roller walker for about 14-month Pending PT eval  CKD stage IIIb Creatinine stable at baseline close to 2. Recent Labs    09/09/21 2325 09/01/22 1913 09/07/22 1029 09/07/22 1043  BUN 26* 24* 21 24*  CREATININE 1.63* 2.19* 1.86* 2.00*   Abdominal aortic aneurysm Large, indication for repair.  And has been following with vascular surgeon and [facility] and was offered AAA repair last year patient declined.  Patient is DNR. Last imaging was one year ago, order a aorta U/S   History of stroke in 2022  Positive for PFO Currently Plavix is on hold for 5 days   Mobility: Encourage ambulation  Goals of care   Code Status: DNR    Scheduled Meds:  atorvastatin  40 mg Oral Daily   sodium chloride flush  3 mL Intravenous Q12H    PRN meds: acetaminophen **OR** acetaminophen, oxyCODONE   Infusions:   sodium chloride      Skin assessment:  Nutritional status:  Body mass index is 19.76 kg/m.          Diet:  Diet Order             Diet Heart Room service appropriate? Yes; Fluid consistency: Thin  Diet effective now                   DVT prophylaxis:  SCDs Start: 09/07/22 1428   Antimicrobials: None Fluid: NS at 55 mill per hour Consultants: None Family Communication: None at bedside  Status is: Inpatient  Continue in-hospital care because: Needs IV resuscitation Level of care: Telemetry Medical    Dispo: The patient is from: Home              Anticipated d/c is to: Pending clinical course              Patient currently is not medically stable to d/c.   Difficult to place patient No    Antimicrobials: Anti-infectives (From admission, onward)    None       Objective: Vitals:   09/08/22 1156 09/08/22 1158  BP: 123/76 123/76  Pulse: 61   Resp: 17   Temp: 98.6 F (37 C)   SpO2: 99%     Intake/Output Summary (Last 24 hours) at 09/08/2022 1402 Last data filed at 09/08/2022 1351 Gross per 24 hour  Intake 503 ml  Output 975 ml  Net -472 ml   Filed Weights   09/07/22 1032 09/07/22 2115  Weight: 74.8 kg 66.1 kg   Weight change:  Body mass index is 19.76 kg/m.   Physical Exam: General exam: Pleasant, elderly Caucasian male..  Mild abrasion on the left side of the forehead Skin: No rashes, lesions or ulcers. HEENT: Atraumatic, normocephalic, no obvious bleeding Lungs: Clear to auscultation bilaterally CVS: Regular rate and rhythm, no murmur GI/Abd soft, nontender, nondistended, bowel sounds present CNS: Alert, awake, oriented to place and person Psychiatry: Mood appropriate Extremities: No pedal edema, no calf tenderness  Data Review: I have personally reviewed the laboratory data and studies available.  F/u labs ordered Unresulted Labs (From admission, onward)    None       Total time spent in review of labs and imaging, patient evaluation, formulation of plan, documentation and communication with family - 100 minutes  Signed, Terrilee Croak, MD [facility] 09/08/2022

## 2022-09-08 NOTE — Progress Notes (Signed)
PT Cancellation Note  Patient Details Name: Isaac Stokes MRN: [id number] DOB: 1951-07-31   Cancelled Treatment:    Reason Eval/Treat Not Completed: Patient at procedure or test/unavailable  Pt undergoing ?echo at bedside. Will reattempt later today. Noted ++orthostatic BPs earlier with nursing.   [place]  Office [phone]  Rexanne Mano 09/08/2022, 10:09 AM

## 2022-09-08 NOTE — Progress Notes (Signed)
Getting very irritable towards the end of the shift. He says that staffs keeps coming in the room and asks him dumb questions and make him stand to take his BP 100 of times.

## 2022-09-08 NOTE — Evaluation (Addendum)
Physical Therapy Evaluation Patient Details Name: Isaac Stokes MRN: [id number] DOB: Sep 17, 1950 Today's Date: 09/08/2022  History of Present Illness  72 y.o. male presented 09/07/22 with repeated syncope episode.  CT head showed left anterior frontal lobe subcortical hemorrhage larger than Rt frontal; +orthostasis  PMH significant of stroke in 2022 on Plavix, PFO, large juxtarenal 6.6 cm abdominal aortic aneurysm, CKD stage IIIb, chronic ambulation dysfunction on roller walker,  Clinical Impression   Pt admitted secondary to problem above with deficits below. PTA patient was living alone in 3rd floor apartment (access to elevator, but he takes the stairs normally). He is modified independent with use of a cane. Pt currently is very orthostatic (see below) and unable to ambulate due to low BP and dizziness. PT will continue to follow to assess his safety with mobility and ability to return home alone.  Anticipate patient will benefit from PT to address problems listed below. Will continue to follow acutely to maximize functional mobility independence and safety.         09/08/22 1156  Orthostatic Lying   BP- Lying 120/84  Pulse- Lying 66  Orthostatic Sitting  BP- Sitting 111/75  Pulse- Sitting 74  Orthostatic Standing at 0 minutes  BP- Standing at 0 minutes (!) 76/66  Pulse- Standing at 0 minutes 74  Pt unable to stand for 3 minute assessment due to dizziness and returned to sit then supine with BP 123/76 in supine   Recommendations for follow up therapy are one component of a multi-disciplinary discharge planning process, led by the attending physician.  Recommendations may be updated based on patient status, additional functional criteria and insurance authorization.  Follow Up Recommendations Other (comment) (TBA as orthostasis improves)      Assistance Recommended at Discharge Frequent or constant Supervision/Assistance  Patient can return home with the following  A lot of help  with walking and/or transfers;A lot of help with bathing/dressing/bathroom;Assistance with cooking/housework;Assist for transportation;Help with stairs or ramp for entrance    Equipment Recommendations Other (comment) (continue to assess as BP improves)  Recommendations for Other Services  OT consult    Functional Status Assessment Patient has had a recent decline in their functional status and demonstrates the ability to make significant improvements in function in a reasonable and predictable amount of time.     Precautions / Restrictions Precautions Precautions: Fall;Other (comment) Precaution Comments: syncope, orthostasis      Mobility  Bed Mobility Overal bed mobility: Needs Assistance Bed Mobility: Supine to Sit, Sit to Supine     Supine to sit: Supervision Sit to supine: Supervision   General bed mobility comments: for safety due to orthostasis    Transfers Overall transfer level: Needs assistance Equipment used: Rolling walker (2 wheels) Transfers: Sit to/from Stand Sit to Stand: Min guard           General transfer comment: for safety due to lightheadedness/orthostasis    Ambulation/Gait               General Gait Details: unable due to drop in BP to 76/66 with dizziness  Stairs            Wheelchair Mobility    Modified Rankin (Stroke Patients Only) Modified Rankin (Stroke Patients Only) Pre-Morbid Rankin Score: Slight disability Modified Rankin: Severe disability     Balance Overall balance assessment: History of Falls (due to syncope)  Pertinent Vitals/Pain Pain Assessment Pain Assessment: No/denies pain    Home Living Family/patient expects to be discharged to:: Private residence Living Arrangements: Alone   Type of Home: Apartment Home Access: Elevator;Stairs to enter Entrance Stairs-Rails: Right Entrance Stairs-Number of Steps: 3 flights   Home Layout: One  level Home Equipment: Cane - single point;Shower seat      Prior Function Prior Level of Function : Independent/Modified Independent             Mobility Comments: reports he takes 3 flights of stairs up/down and does not use elevator; uses cane at all times ADLs Comments: reports modified independent (uses seat in shower)     Hand Dominance   Dominant Hand: Right    Extremity/Trunk Assessment   Upper Extremity Assessment Upper Extremity Assessment: Generalized weakness    Lower Extremity Assessment Lower Extremity Assessment: Generalized weakness (symmetrically weak)    Cervical / Trunk Assessment Cervical / Trunk Assessment: Kyphotic  Communication   Communication: No difficulties  Cognition Arousal/Alertness: Awake/alert Behavior During Therapy: Flat affect Overall Cognitive Status: Within Functional Limits for tasks assessed                                 General Comments: A&O x 4        General Comments General comments (skin integrity, edema, etc.): see vitals flowsheet for orthostatic BPs. Patient became too dizzy to stand for 3 minute assessment    Exercises     Assessment/Plan    PT Assessment Patient needs continued PT services  PT Problem List Decreased strength;Decreased activity tolerance;Decreased balance;Decreased mobility;Decreased knowledge of use of DME;Decreased knowledge of precautions;Cardiopulmonary status limiting activity       PT Treatment Interventions DME instruction;Gait training;Stair training;Functional mobility training;Therapeutic activities;Therapeutic exercise;Balance training;Patient/family education    PT Goals (Current goals can be found in the Care Plan section)  Acute Rehab PT Goals Patient Stated Goal: feel better PT Goal Formulation: With patient Time For Goal Achievement: 09/22/22 Potential to Achieve Goals: Good    Frequency Min 3X/week     Co-evaluation               AM-PAC PT "6  Clicks" Mobility  Outcome Measure Help needed turning from your back to your side while in a flat bed without using bedrails?: None Help needed moving from lying on your back to sitting on the side of a flat bed without using bedrails?: A Little Help needed moving to and from a bed to a chair (including a wheelchair)?: A Little Help needed standing up from a chair using your arms (e.g., wheelchair or bedside chair)?: A Little Help needed to walk in hospital room?: Total Help needed climbing 3-5 steps with a railing? : Total 6 Click Score: 15    End of Session Equipment Utilized During Treatment: Gait belt Activity Tolerance: Treatment limited secondary to medical complications (Comment) (dizziness/orthostasis) Patient left: in bed;with call bell/phone within reach;with bed alarm set Nurse Communication: Mobility status;Other (comment) (+orthostasis even with legs wrapped) PT Visit Diagnosis: Repeated falls (R29.6);Muscle weakness (generalized) (M62.81)    Time: 0254-2706 PT Time Calculation (min) (ACUTE ONLY): 27 min   Charges:   PT Evaluation $PT Eval Low Complexity: 1 Low PT Treatments $Therapeutic Activity: 8-22 mins         Arby Barrette, PT Acute Rehabilitation Services  Office [phone]   Rexanne Mano 09/08/2022, 12:14 PM

## 2022-09-09 DIAGNOSIS — R55 Syncope and collapse: Secondary | ICD-10-CM | POA: Diagnosis not present

## 2022-09-09 LAB — GLUCOSE, CAPILLARY: Glucose-Capillary: 94 mg/dL (ref 70–99)

## 2022-09-09 MED ORDER — MIDODRINE HCL 5 MG PO TABS
10.0000 mg | ORAL_TABLET | Freq: Two times a day (BID) | ORAL | Status: DC
  Administered 2022-09-09: 10 mg via ORAL
  Filled 2022-09-09: qty 2

## 2022-09-09 MED ORDER — MIDODRINE HCL 10 MG PO TABS: 10.0000 mg | ORAL_TABLET | Freq: Two times a day (BID) | ORAL | 2 refills | Status: AC

## 2022-09-09 NOTE — Progress Notes (Signed)
Physical Therapy Treatment Patient Details Name: Isaac Stokes MRN: [id number] DOB: July 29, 1951 Today's Date: 09/09/2022   History of Present Illness 71 y.o. male presented 09/07/22 with repeated syncope episode.  CT head showed left anterior frontal lobe subcortical hemorrhage larger than Rt frontal; +orthostasis  PMH significant of stroke in 2022 on Plavix, PFO, large juxtarenal 6.6 cm abdominal aortic aneurysm, CKD stage IIIb, chronic ambulation dysfunction on roller walker,    PT Comments    Patient progressing well towards PT goals. Session focused on progressive ambulation and transfers. Pt requires Min guard assist for standing and gait training with use of RW for support. No dizziness reported during mobility today. See orthostatic BPs below. Wrapped LEs in ace wraps prior to walking. Supine BP 135/89 HR 55 bpm Sitting BP 110/75, HR 67 bpm Standing BP (with marching) 133/101 HR 75 bpm Sitting BP post walk 138/86, HR 92 bpm Pt feels better today and plans to d/c home with hospice care. Will follow.    Recommendations for follow up therapy are one component of a multi-disciplinary discharge planning process, led by the attending physician.  Recommendations may be updated based on patient status, additional functional criteria and insurance authorization.  Follow Up Recommendations  Other (comment) (continue hospice)     Assistance Recommended at Discharge Frequent or constant Supervision/Assistance  Patient can return home with the following Assistance with cooking/housework;Assist for transportation;Help with stairs or ramp for entrance;A little help with walking and/or transfers;A little help with bathing/dressing/bathroom   Equipment Recommendations  None recommended by PT    Recommendations for Other Services       Precautions / Restrictions Precautions Precautions: Fall;Other (comment) Precaution Comments: syncope, orthostasis Restrictions Weight Bearing  Restrictions: No     Mobility  Bed Mobility Overal bed mobility: Needs Assistance Bed Mobility: Supine to Sit     Supine to sit: Supervision, HOB elevated     General bed mobility comments: No assist needed, supervision for safety.    Transfers Overall transfer level: Needs assistance Equipment used: Rolling walker (2 wheels) Transfers: Sit to/from Stand Sit to Stand: Min guard           General transfer comment: Min guard for safety. Stood from EOB x2, no dizziness.  Transferred to chair post ambulation.    Ambulation/Gait Ambulation/Gait assistance: Min guard Gait Distance (Feet): 150 Feet (+ 15') Assistive device: Rolling walker (2 wheels), 1 person hand held assist Gait Pattern/deviations: Step-through pattern, Decreased stride length, Trunk flexed Gait velocity: decreased Gait velocity interpretation: <1.31 ft/sec, indicative of household ambulator   General Gait Details: Slow, mildly unsteady gait with use of RW, cues for RW management. No dizziness.   Stairs             Wheelchair Mobility    Modified Rankin (Stroke Patients Only) Modified Rankin (Stroke Patients Only) Pre-Morbid Rankin Score: Slight disability Modified Rankin: Moderately severe disability     Balance Overall balance assessment: Needs assistance, History of Falls Sitting-balance support: Feet supported, No upper extremity supported Sitting balance-Leahy Scale: Good     Standing balance support: During functional activity Standing balance-Leahy Scale: Fair Standing balance comment: Min guard for safety in standing due to hx of orthostasis                            Cognition Arousal/Alertness: Awake/alert Behavior During Therapy: Flat affect Overall Cognitive Status: Impaired/Different from baseline Area of Impairment: Memory, Safety/judgement  Memory: Decreased short-term memory   Safety/Judgement: Decreased awareness of deficits,  Decreased awareness of safety     General Comments: Poor memory, not able to recall events or people.        Exercises      General Comments General comments (skin integrity, edema, etc.): See vitals flowsheet for orthostatics. No dizziness reported today.      Pertinent Vitals/Pain Pain Assessment Pain Assessment: No/denies pain    Home Living                          Prior Function            PT Goals (current goals can now be found in the care plan section) Progress towards PT goals: Progressing toward goals    Frequency    Min 3X/week      PT Plan Current plan remains appropriate    Co-evaluation              AM-PAC PT "6 Clicks" Mobility   Outcome Measure  Help needed turning from your back to your side while in a flat bed without using bedrails?: None Help needed moving from lying on your back to sitting on the side of a flat bed without using bedrails?: A Little Help needed moving to and from a bed to a chair (including a wheelchair)?: A Little Help needed standing up from a chair using your arms (e.g., wheelchair or bedside chair)?: A Little Help needed to walk in hospital room?: A Little Help needed climbing 3-5 steps with a railing? : A Little 6 Click Score: 19    End of Session Equipment Utilized During Treatment: Gait belt Activity Tolerance: Patient tolerated treatment well Patient left: in chair;with call bell/phone within reach;with chair alarm set Nurse Communication: Mobility status PT Visit Diagnosis: Repeated falls (R29.6);Muscle weakness (generalized) (M62.81)     Time: 1240-1301 PT Time Calculation (min) (ACUTE ONLY): 21 min  Charges:  $Gait Training: 8-22 mins                     Marisa Severin, PT, DPT Acute Rehabilitation Services Secure chat preferred Office [place] 09/09/2022, 1:31 PM

## 2022-09-09 NOTE — Progress Notes (Signed)
Pt very agitated overnight, impulsive, trying to get up out of bed without help to go to the bathrooom, refusing BSC, using lots of profanity, continuously stating  "I'm leaving first thing in the morning, I don't care what the doctor's say, they can't keep me here against my will", "I'll never set foot in this place" "If my insurance won't pay for this you can tell them I'm not paying a dime". Tried to reassure the pt but seemed to make little impact.

## 2022-09-09 NOTE — Evaluation (Signed)
Occupational Therapy Evaluation Patient Details Name: Isaac Stokes MRN: [id number] DOB: [birth year] Today's Date: 09/09/2022   History of Present Illness 72 y.o. male presented 09/07/22 with repeated syncope episode.  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 Patient presenting with cognitive impairments (likely baseline), generalized weakness, and assessed orthostatic BPs. Patient with no dizziness with BPs outlined below. OT recommending continuance with hospice care once medically appropriate.   Supine BP 135/89 HR 55 bpm Sitting BP 110/75, HR 67 bpm Standing BP (with marching) 133/101 HR 75 bpm Sitting BP post walk 138/86, HR 92 bpm     Recommendations for follow up therapy are one component of a multi-disciplinary discharge planning process, led by the attending physician.  Recommendations may be updated based on patient status, additional functional criteria and insurance authorization.   Follow Up Recommendations  No OT follow up (Patient is being followed by hospice, but would benefit from [facility])     Assistance Recommended at Discharge Frequent or constant Supervision/Assistance  Patient can return home with the following A little help with walking and/or transfers;A little help with bathing/dressing/bathroom;Assistance with cooking/housework;Direct supervision/assist for medications management;Direct supervision/assist for financial management;Assist for transportation;Help with stairs or ramp for entrance    Functional Status Assessment  Patient has had a recent decline in their functional  status and demonstrates the ability to make significant improvements in function in a reasonable and predictable amount of time.  Equipment Recommendations  None recommended by OT (Patient has DME needed)    Recommendations for Other Services       Precautions / Restrictions Precautions Precautions: Fall;Other (comment) Precaution Comments: syncope, orthostasis Restrictions Weight Bearing Restrictions: No      Mobility Bed Mobility Overal bed mobility: Needs Assistance Bed Mobility: Supine to Sit, Sit to Supine     Supine to sit: Supervision Sit to supine: Supervision   General bed mobility comments: for safety due to orthostasis    Transfers Overall transfer level: Needs assistance Equipment used: Rolling walker (2 wheels) Transfers: Sit to/from Stand Sit to Stand: Min guard           General transfer comment: for safety due to lightheadedness/orthostasis      Balance Overall balance assessment: History of Falls (due to syncope) Sitting-balance support: Feet supported, No upper extremity supported Sitting balance-Leahy Scale: Good     Standing balance support: During functional activity Standing balance-Leahy Scale: Fair Standing balance comment: Min guard for safety in standing due to hx of orthostasis                           ADL either performed or assessed with clinical judgement   ADL Overall ADL's : Needs assistance/impaired Eating/Feeding: Set up;Sitting   Grooming: Set up;Sitting   Upper Body Bathing: Min guard;Sitting   Lower Body Bathing: Minimal assistance;Sitting/lateral leans;Sit to/from stand   Upper Body Dressing : Min guard;Sitting   Lower Body Dressing: Minimal assistance;Sitting/lateral leans;Sit to/from stand   Toilet Transfer: Minimal assistance;Ambulation Toilet Transfer Details (indicate cue type and reason): min A for safety and to rise initially Toileting- Water quality scientist and Hygiene: Min guard;Sit  to/from  stand;Sitting/lateral lean       Functional mobility during ADLs: Min guard;Cueing for safety;Cueing for sequencing;Rolling walker (2 wheels) General ADL Comments: Patient presenting with cognitive impairments, generalized weakness, but stable BPs with ambulation     Vision Baseline Vision/History: 0 No visual deficits Ability to See in Adequate Light: 0 Adequate Patient Visual Report: No change from baseline       Perception     Praxis      Pertinent Vitals/Pain Pain Assessment Pain Assessment: No/denies pain     Hand Dominance Right   Extremity/Trunk Assessment Upper Extremity Assessment Upper Extremity Assessment: Generalized weakness   Lower Extremity Assessment Lower Extremity Assessment: Defer to PT evaluation   Cervical / Trunk Assessment Cervical / Trunk Assessment: Kyphotic   Communication Communication Communication: No difficulties   Cognition Arousal/Alertness: Awake/alert Behavior During Therapy: Flat affect Overall Cognitive Status: Impaired/Different from baseline Area of Impairment: Memory, Safety/judgement                     Memory: Decreased short-term memory   Safety/Judgement: Decreased awareness of deficits, Decreased awareness of safety     General Comments: Poor memory, not able to recall events or people. More than likely is his baseline     General Comments  See vitals flowsheet for orthostatics. No dizziness reported today.    Exercises     Shoulder Instructions      Home Living Family/patient expects to be discharged to:: Private residence Living Arrangements: Alone   Type of Home: Apartment Home Access: Elevator;Stairs to enter Entrance Stairs-Number of Steps: 3 flights Entrance Stairs-Rails: Right Home Layout: One level               Home Equipment: Cane - single point;Shower seat          Prior Functioning/Environment Prior Level of Function : Independent/Modified Independent              Mobility Comments: reports he takes 3 flights of stairs up/down and does not use elevator; uses cane at all times ADLs Comments: reports modified independent (uses seat in shower)        OT Problem List: Decreased strength;Decreased range of motion;Decreased activity tolerance;Impaired balance (sitting and/or standing);Decreased coordination;Decreased cognition;Decreased safety awareness;Decreased knowledge of precautions;Decreased knowledge of use of DME or AE      OT Treatment/Interventions: Self-care/ADL training;Therapeutic exercise;Energy conservation;DME and/or AE instruction;Cognitive remediation/compensation;Patient/family education;Balance training    OT Goals(Current goals can be found in the care plan section) Acute Rehab OT Goals Patient Stated Goal: to go home OT Goal Formulation: With patient Time For Goal Achievement: 09/23/22 Potential to Achieve Goals: Fair  OT Frequency: Min 2X/week    Co-evaluation              AM-PAC OT "6 Clicks" Daily Activity     Outcome Measure Help from another person eating meals?: A Little Help from another person taking care of personal grooming?: A Little Help from another person toileting, which includes using toliet, bedpan, or urinal?: A Little Help from another person bathing (including washing, rinsing, drying)?: A Little Help from another person to put on and taking off regular upper body clothing?: A Little Help from another person to put on and taking off regular lower body clothing?: A Little 6 Click Score: 18   End of Session Equipment Utilized During Treatment: Gait belt;Rolling walker (2 wheels) Nurse Communication: Mobility status  Activity Tolerance: Patient tolerated treatment well Patient left: in chair;with call bell/phone  within reach;with chair alarm set  OT Visit Diagnosis: Unsteadiness on feet (R26.81);Other abnormalities of gait and mobility (R26.89);Repeated falls (R29.6);Muscle weakness (generalized)  (M62.81);History of falling (Z91.81);Other symptoms and signs involving cognitive function;Adult, failure to thrive (R62.7)                Time: 1240-1303 OT Time Calculation (min): 23 min Charges:  OT General Charges $OT Visit: 1 Visit OT Evaluation $OT Eval Moderate Complexity: 1 Mod  Pollyann Glen E. Zakari Couchman, OTR/L Acute Rehabilitation Services [phone]   Cherlyn Cushing 09/09/2022, 2:35 PM

## 2022-09-09 NOTE — TOC Transition Note (Signed)
Transition of Care [facility]) - CM/SW Discharge Note   Patient Details  Name: Isaac Stokes MRN: [id number] Date of Birth: [birth year]  [facility] CM/SW Contact:  Pollie Friar, RN Phone Number: 09/09/2022, 1:24 PM   Clinical Narrative:    Pt is discharging back home with self care. He is active with [facility] for home hospice. Pt has needed DME in the home.  Pts brother to provide needed transport home.    Final next level of care: Home w Hospice Care Barriers to Discharge: No Barriers Identified   Patient Goals and CMS Choice      Discharge Placement                         Discharge Plan and Services Additional resources added to the After Visit Summary for                                       Social Determinants of Health (SDOH) Interventions SDOH Screenings   Food Insecurity: No Food Insecurity (09/07/2022)  Housing: Low Risk  (09/07/2022)  Transportation Needs: No Transportation Needs (09/07/2022)  Utilities: Not At Risk (09/07/2022)  Tobacco Use: High Risk (09/07/2022)     Readmission Risk Interventions     No data to display

## 2022-09-09 NOTE — Discharge Summary (Signed)
Physician Discharge Summary  JASMEET GEHL OEU:[id number] DOB: Jul 15, 1951 DOA: 09/07/2022  PCP: [facility]  Admit date: 09/07/2022 Discharge date: 09/09/2022  Admitted From: Home with hospice Discharge disposition: Back to home with hospice  Recommendations at discharge:  Midodrine has been started  Brief narrative: Isaac Stokes is a 72 y.o. 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 1/10, patient was brought to the ED from home after witnessed syncopal episode EMS noted abrasion on the left side of the head and brought to the ED on cervical collar.  Per report, in the last 1 to 2 weeks, patient has had multiple episodes of lightheadedness that occurs mostly on standing up. On 1/10, he woke up in the morning, rose up to go to the door and suddenly passed out.  He denies any premonition symptoms he hit the left side of his forehead.  He had similar episode 4 days ago for which she was brought to the ED.  CT scan of head was negative at that time and he was discharged home.  In the ED, heart rate was in 50s, blood pressure in 130s CT head showed left anterior frontal lobe subcortical hemorrhage 14 x 14 x 8 mm, and multiple other small subcortical hemorrhage.  Neurosurgery consulted who reviewed the imaging and recommended conservative management, repeat CT scan in 24 hours and Plavix for 5 days Admitted to [facility]  Subjective: Patient was seen and examined this morning.  Pleasant elderly Caucasian male.  Lying on bed.  Not in distress.  No family at bedside. Event from last admitted.  Patient was agitated, restless and wanted to leave. This morning I called and discussed with hospice nurse Georgia Duff.  Patient has known history of orthostatic hypotension.  I started him on midodrine this time.  Hospital course: Recurrent  syncope Likely orthostatic in nature given history 1/11, he was noted to have significant drop in orthostatic vital sign this morning from 120/84 on lying down to 76/66 on standing up.  Associated with dizziness.  He was kept overnight hydration with normal saline at 75 mill per hour echocardiogram showed EF 55 to 60% I will start him on midodrine 10 mg twice daily.  He is not on any other blood pressure medicines.  Intracranial hemorrhage Secondary to fall.   CT head showed left anterior frontal lobe subcortical hemorrhage 14 x 14 x 8 mm, and multiple other small subcortical hemorrhage.  Neurosurgery consulted who reviewed the imaging and recommended conservative management, repeat CT scan in 24 hours and hold Plavix for 5 days.  Based on his recurrent falls, I will stop Plavix altogether. Repeat CT head next morning did not show any significant change or mass effect.  Chronic ambulatory dysfunction On roller walker for about 12-month  CKD stage IIIb Creatinine stable at baseline close to 2. Recent Labs    09/09/21 2325 09/01/22 1913 09/07/22 1029 09/07/22 1043  BUN 26* 24* 21 24*  CREATININE 1.63* 2.19* 1.86* 2.00*   Abdominal aortic aneurysm Large, indication for repair.  And has been following with vascular surgeon and [facility] and was offered AAA repair last year patient declined.  Patient is DNR. Last imaging was one year ago, order a aorta U/S   History of stroke in 2022  Positive for PFO Based on his recurrent falls, I will stop Plavix altogether.   Mobility: Encourage  ambulation  Goals of care   Code Status: DNR   Wounds:  -    Discharge Exam:   Vitals:   09/08/22 2037 09/08/22 2337 09/09/22 0352 09/09/22 0807  BP:  (!) 151/72 136/70 124/73  Pulse:  60 (!) 55 63  Resp: 13 19 18 17   Temp:  98.1 F (36.7 C) 98 F (36.7 C) 97.7 F (36.5 C)  TempSrc:  Oral Axillary Oral  SpO2: 100% 99% 95% 98%  Weight:      Height:        Body mass index is  19.76 kg/m.   General exam: Pleasant, elderly Caucasian male..  Mild abrasion on the left side of the forehead Skin: No rashes, lesions or ulcers. HEENT: Atraumatic, normocephalic, no obvious bleeding Lungs: Clear to auscultation bilaterally CVS: Regular rate and rhythm, no murmur GI/Abd soft, nontender, nondistended, bowel sounds present CNS: Alert, awake, oriented to place and person Psychiatry: Mood appropriate Extremities: No pedal edema, no calf tenderness  Follow ups:    Follow-up Information     [facility] Follow up.   Contact information: [address] [place] [phone]                 Discharge Instructions:   Discharge Instructions     Call MD for:  difficulty breathing, headache or visual disturbances   Complete by: As directed    Call MD for:  extreme fatigue   Complete by: As directed    Call MD for:  hives   Complete by: As directed    Call MD for:  persistant dizziness or light-headedness   Complete by: As directed    Call MD for:  persistant nausea and vomiting   Complete by: As directed    Call MD for:  severe uncontrolled pain   Complete by: As directed    Call MD for:  temperature >100.4   Complete by: As directed    Diet general   Complete by: As directed    Discharge instructions   Complete by: As directed    Recommendations at discharge:   Midodrine has been started  General discharge instructions: Follow with Primary MD [facility] in 7 days  Please request your PCP  to go over your hospital tests, procedures, radiology results at the follow up. Please get your medicines reviewed and adjusted.  Your PCP may decide to repeat certain labs or tests as needed. Do not drive, operate heavy machinery, perform activities at heights, swimming or participation in water activities or provide baby sitting services if your were admitted for syncope or siezures until you have seen by Primary MD  or a Neurologist and advised to do so again. [place] [phone] Controlled Substance Reporting System database was reviewed. Do not drive, operate heavy machinery, perform activities at heights, swim, participate in water activities or provide baby-sitting services while on medications for pain, sleep and mood until your outpatient physician has reevaluated you and advised to do so again.  You are strongly recommended to comply with the dose, frequency and duration of prescribed medications. Activity: As tolerated with Full fall precautions use walker/cane & assistance as needed Avoid using any recreational substances like cigarette, tobacco, alcohol, or non-prescribed drug. If you experience worsening of your admission symptoms, develop shortness of breath, life threatening emergency, suicidal or homicidal thoughts you must seek medical attention immediately by calling 911 or calling your MD immediately  if symptoms less severe. You must read  complete instructions/literature along with all the possible adverse reactions/side effects for all the medicines you take and that have been prescribed to you. Take any new medicine only after you have completely understood and accepted all the possible adverse reactions/side effects.  Wear Seat belts while driving. You were cared for by a hospitalist during your hospital stay. If you have any questions about your discharge medications or the care you received while you were in the hospital after you are discharged, you can call the unit and ask to speak with the hospitalist or the covering physician. Once you are discharged, your primary care physician will handle any further medical issues. Please note that NO REFILLS for any discharge medications will be authorized once you are discharged, as it is imperative that you return to your primary care physician (or establish a relationship with a primary care physician if you do not have one).   Increase activity slowly    Complete by: As directed        Discharge Medications:   Allergies as of 09/09/2022   No Known Allergies      Medication List     STOP taking these medications    atorvastatin 80 MG tablet Commonly known as: LIPITOR   clopidogrel 75 MG tablet Commonly known as: PLAVIX       TAKE these medications    gabapentin 300 MG capsule Commonly known as: NEURONTIN Take 300 mg by mouth 3 (three) times daily.   Lexapro 5 MG tablet Generic drug: escitalopram Take 5 mg by mouth daily.   LORazepam 0.5 MG tablet Commonly known as: ATIVAN Take 0.5 mg by mouth every 4 (four) hours as needed for anxiety.   midodrine 10 MG tablet Commonly known as: PROAMATINE Take 1 tablet (10 mg total) by mouth 2 (two) times daily with a meal.   tamsulosin 0.4 MG Caps capsule Commonly known as: FLOMAX Take 0.4 mg by mouth daily.   traMADol 50 MG tablet Commonly known as: ULTRAM Take 50 mg by mouth every 8 (eight) hours as needed for moderate pain or severe pain.         The results of significant diagnostics from this hospitalization (including imaging, microbiology, ancillary and laboratory) are listed below for reference.    Procedures and Diagnostic Studies:   ECHOCARDIOGRAM COMPLETE  Result Date: 09/08/2022    ECHOCARDIOGRAM REPORT   Patient Name:   Isaac Stokes Date of Exam: 09/08/2022 Medical Rec #:  [id number]        Height:       72.0 in Accession #:    [id number]       Weight:       145.7 lb Date of Birth:  Apr 29, 1951        BSA:          1.862 m Patient Age:    71 years         BP:           140/84 mmHg Patient Gender: M                HR:           60 bpm. Exam Location:  Inpatient Procedure: 2D Echo, Cardiac Doppler and Color Doppler Indications:    Syncope  History:        Patient has prior history of Echocardiogram examinations, most                 recent 07/29/2021. TIA and AAA; Risk  Factors:Current Smoker. CKD.  Sonographer:    Eartha Inch Referring Phys: Lequita Halt   Sonographer Comments: Technically challenging study due to limited acoustic windows and Technically difficult study due to poor echo windows. Image acquisition challenging due to patient body habitus and Image acquisition challenging due to respiratory motion. IMPRESSIONS  1. Left ventricular ejection fraction, by estimation, is 55 to 60%. The left ventricle has normal function. Left ventricular endocardial border not optimally defined to evaluate regional wall motion. Left ventricular diastolic parameters are consistent with Grade I diastolic dysfunction (impaired relaxation).  2. Right ventricular systolic function is normal. The right ventricular size is normal. Tricuspid regurgitation signal is inadequate for assessing PA pressure.  3. The mitral valve is grossly normal. Trivial mitral valve regurgitation.  4. The aortic valve was not well visualized. Aortic valve regurgitation is mild. Aortic valve sclerosis is present, with no evidence of aortic valve stenosis.  5. The inferior vena cava is normal in size with greater than 50% respiratory variability, suggesting right atrial pressure of 3 mmHg. Comparison(s): No significant change from prior study. FINDINGS  Left Ventricle: Left ventricular ejection fraction, by estimation, is 55 to 60%. The left ventricle has normal function. Left ventricular endocardial border not optimally defined to evaluate regional wall motion. The left ventricular internal cavity size was normal in size. There is no left ventricular hypertrophy. Left ventricular diastolic parameters are consistent with Grade I diastolic dysfunction (impaired relaxation). Right Ventricle: The right ventricular size is normal. No increase in right ventricular wall thickness. Right ventricular systolic function is normal. Tricuspid regurgitation signal is inadequate for assessing PA pressure. Left Atrium: Left atrial size was normal in size. Right Atrium: Right atrial size was normal in size. Pericardium:  There is no evidence of pericardial effusion. Mitral Valve: The mitral valve is grossly normal. Trivial mitral valve regurgitation. Tricuspid Valve: The tricuspid valve is normal in structure. Tricuspid valve regurgitation is trivial. Aortic Valve: The aortic valve was not well visualized. Aortic valve regurgitation is mild. Aortic valve sclerosis is present, with no evidence of aortic valve stenosis. Pulmonic Valve: The pulmonic valve was not well visualized. Aorta: The aortic root and ascending aorta are structurally normal, with no evidence of dilitation. Venous: The inferior vena cava is normal in size with greater than 50% respiratory variability, suggesting right atrial pressure of 3 mmHg. IAS/Shunts: The atrial septum is grossly normal.  LEFT VENTRICLE PLAX 2D LVIDd:         4.90 cm   Diastology LVIDs:         3.50 cm   LV e' medial:    7.28 cm/s LV PW:         1.10 cm   LV E/e' medial:  6.4 LV IVS:        0.80 cm   LV e' lateral:   5.25 cm/s LVOT diam:     2.00 cm   LV E/e' lateral: 8.9 LVOT Area:     3.14 cm  RIGHT VENTRICLE             IVC RV S prime:     13.50 cm/s  IVC diam: 1.30 cm TAPSE (M-mode): 2.3 cm LEFT ATRIUM           Index        RIGHT ATRIUM           Index LA diam:      3.30 cm 1.77 cm/m   RA Area:     15.90 cm LA  Vol (A2C): 27.0 ml 14.50 ml/m  RA Volume:   49.40 ml  26.53 ml/m LA Vol (A4C): 26.7 ml 14.34 ml/m   AORTA Ao Root diam: 3.40 cm Ao Asc diam:  3.00 cm MITRAL VALVE MV Area (PHT): 2.16 cm    SHUNTS MV Decel Time: 351 msec    Systemic Diam: 2.00 cm MV E velocity: 46.80 cm/s MV A velocity: 47.60 cm/s MV E/A ratio:  0.98 Gwyndolyn Kaufman MD Electronically signed by Gwyndolyn Kaufman MD Signature Date/Time: 09/08/2022/10:46:48 AM    Final    CT HEAD WO CONTRAST (5MM)  Result Date: 09/08/2022 CLINICAL DATA:  71 year old male status post fall on Plavix with intracranial hemorrhages. EXAM: CT HEAD WITHOUT CONTRAST TECHNIQUE: Contiguous axial images were obtained from the base of  the skull through the vertex without intravenous contrast. RADIATION DOSE REDUCTION: This exam was performed according to the departmental dose-optimization program which includes automated exposure control, adjustment of the mA and/or kV according to patient size and/or use of iterative reconstruction technique. COMPARISON:  Head CT 09/07/2022 and earlier. FINDINGS: Brain: Anterior left superior frontal gyrus probable shear hemorrhages at the gray-white junction are stable since yesterday, the larger on series 2, image 29 is about 15 mm. Mild regional edema, no significant mass effect. Questionable punctate contralateral hemorrhagic contusion also on series 2, image 29, but not correlated on the coronal or sagittal images. However, there is a small focus of adherent thrombus in the left lateral ventricle adjacent to the posterior septum pellucidum. Small more generalized blood products there yesterday in retrospect. But no other IVH. No convincing subdural blood. Basilar cisterns remain normal. No intracranial mass effect. Stable multifocal chronic encephalomalacia with some ex vacuo ventricular enlargement. No cortically based acute infarct identified. Vascular: Calcified atherosclerosis at the skull base. No suspicious intracranial vascular hyperdensity. Skull: No acute osseous abnormality identified. Sinuses/Orbits: Visualized paranasal sinuses and mastoids are stable and well aerated. Other: No discrete orbit or scalp soft tissue injury. IMPRESSION: 1. Several small shear hemorrhages and trace intraventricular blood not significantly changed from yesterday. 2. No significant intracranial mass effect. No new intracranial hemorrhage identified. No skull fracture identified. 3. Chronic infarcts and encephalomalacia. Electronically Signed   By: Genevie Ann M.D.   On: 09/08/2022 09:36   US AORTA  Result Date: 09/07/2022 CLINICAL DATA:  Aortic aneurysm. EXAM: ULTRASOUND OF ABDOMINAL AORTA TECHNIQUE: Ultrasound  examination of the abdominal aorta and proximal common iliac arteries was performed to evaluate for aneurysm. Additional color and Doppler images of the distal aorta were obtained to document patency. COMPARISON:  CT lumbar spine 09/01/2022. CTA abdomen and pelvis 09/30/2021. FINDINGS: Abdominal aortic measurements as follows: Proximal:  2.9 x 2.1 cm Mid: Obscured by bowel gas Distal:  6.1 x 9.0 cm with prominent mural thrombus Patent: Yes, peak systolic velocity is 58 cm/s Right common iliac artery: 1.5 x 1.3 cm Left common iliac artery: 1.7 x 1.4 cm IMPRESSION: 9 cm abdominal aortic aneurysm, increased in size from 2023. Recommend referral to a vascular specialist. Reference: J Am Coll Radiol 3382;50:[id number]. Electronically Signed   By: Logan Bores M.D.   On: 09/07/2022 17:01   CT CERVICAL SPINE WO CONTRAST  Result Date: 09/07/2022 CLINICAL DATA:  Poly trauma, blunt. Syncopal episode with head injury. EXAM: CT CERVICAL SPINE WITHOUT CONTRAST TECHNIQUE: Multidetector CT imaging of the cervical spine was performed without intravenous contrast. Multiplanar CT image reconstructions were also generated. RADIATION DOSE REDUCTION: This exam was performed according to the departmental dose-optimization program which includes automated  exposure control, adjustment of the mA and/or kV according to patient size and/or use of iterative reconstruction technique. COMPARISON:  CT of the cervical spine 09/01/2022. FINDINGS: Alignment: No significant listhesis is present. Cervical lordosis is stable. Skull base and vertebrae: The craniocervical junction is normal. Upper cervical spine is within normal limits. Marrow signal is unremarkable. Vertebral body heights are stable. No acute fractures are present. Soft tissues and spinal canal: No prevertebral fluid or swelling. No visible canal hematoma. Disc levels: Multilevel degenerative changes are again noted in the cervical spine, most significant disease is present at C5-6.  Upper chest: The lung apices are clear. Centrilobular emphysematous changes are noted. IMPRESSION: 1. No acute fracture or traumatic subluxation. 2. Multilevel degenerative changes of the cervical spine are again noted, most significant at C5-6. 3.  Emphysema (ICD10-J43.9). Electronically Signed   By: Marin Roberts M.D.   On: 09/07/2022 11:37   CT HEAD WO CONTRAST  Result Date: 09/07/2022 CLINICAL DATA:  Head trauma, moderate to severe. Witnessed syncopal episode. EXAM: CT HEAD WITHOUT CONTRAST TECHNIQUE: Contiguous axial images were obtained from the base of the skull through the vertex without intravenous contrast. RADIATION DOSE REDUCTION: This exam was performed according to the departmental dose-optimization program which includes automated exposure control, adjustment of the mA and/or kV according to patient size and/or use of iterative reconstruction technique. COMPARISON:  CT head without contrast 09/01/2022 FINDINGS: Brain: Acute subcortical hemorrhage is present in the high anterior left frontal lobe measuring 14 x 14 x 8 mm (AP x CC x TR). Multiple other smaller subcortical hemorrhages are present. A smaller cortical or subcortical based hemorrhage is present in the anterior left frontal lobe on axial image 24 of series 3. A focal hyperdense focus is present in the posteromedial right occipital lobe measuring 4 mm. Multiple hyperdense small foci are present in the anterior right frontal lobe. The largest measures 2 mm on sagittal image 16 of series 6. No significant extra-axial collections are present. 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 A remote lacunar infarct is present in the lateral right thalamus. No acute ischemic infarcts are present. Brainstem and cerebellum are otherwise normal. Vascular: Atherosclerotic calcifications are present in the cavernous internal carotid arteries and left vertebral artery without a  hyperdense vessel. Skull: Left frontal scalp soft tissue swelling extends to the lateral orbit. No underlying fracture or foreign body is present. Sinuses/Orbits: The paranasal sinuses and mastoid air cells are clear. The globes and orbits are within normal limits. Other: IMPRESSION: 1. Multiple cortical and subcortical hemorrhages compatible with acute trauma. The largest is an acute subcortical hemorrhage in the high anterior left frontal lobe measuring 14 x 14 x 8 mm. Given the location and multiple lesions, these likely represent shear injuries. 2. 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 3. Remote lacunar infarct in the lateral right thalamus. 4. Left frontal scalp soft tissue swelling extends to the lateral orbit. No underlying fracture or foreign body is present. Critical Value/emergent results were called by telephone at the time of interpretation on 09/07/2022 at 11:34 am to provider [facility] , who verbally acknowledged these results. Electronically Signed   By: Marin Roberts M.D.   On: 09/07/2022 11:34   DG Chest 2 View  Result Date: 09/07/2022 CLINICAL DATA:  Fall.  Syncope EXAM: CHEST - 2 VIEW COMPARISON:  One view x-ray 09/01/2022.  Older exams as well. FINDINGS:  Overlapping cardiac leads. No consolidation, pneumothorax or effusion. No edema. Calcified tortuous aorta. Degenerative changes of the spine on lateral view. IMPRESSION: No acute cardiopulmonary disease. Electronically Signed   By: Karen KaysAshok  Gupta M.D.   On: 09/07/2022 11:09     Labs:   Basic Metabolic Panel: Recent Labs  Lab 09/07/22 1029 09/07/22 1043  NA 137 138  K 4.3 4.3  CL 106 107  CO2 22  --   GLUCOSE 88 88  BUN 21 24*  CREATININE 1.86* 2.00*  CALCIUM 8.5*  --    GFR Estimated Creatinine Clearance: 30.8 mL/min (A) (by C-G formula based on SCr of 2 mg/dL (H)). Liver Function Tests: Recent Labs  Lab 09/07/22 1029  AST 17  ALT 9  ALKPHOS 54  BILITOT  0.5  PROT 6.3*  ALBUMIN 3.2*   No results for input(s): "LIPASE", "AMYLASE" in the last 168 hours. No results for input(s): "AMMONIA" in the last 168 hours. Coagulation profile Recent Labs  Lab 09/07/22 1029  INR 1.1    CBC: Recent Labs  Lab 09/07/22 1029 09/07/22 1043  WBC 4.8  --   HGB 10.7* 10.9*  HCT 33.9* 32.0*  MCV 96.3  --   PLT 177  --    Cardiac Enzymes: No results for input(s): "CKTOTAL", "CKMB", "CKMBINDEX", "TROPONINI" in the last 168 hours. BNP: Invalid input(s): "POCBNP" CBG: Recent Labs  Lab 09/08/22 0619 09/09/22 0623  GLUCAP 89 94   D-Dimer No results for input(s): "DDIMER" in the last 72 hours. Hgb A1c No results for input(s): "HGBA1C" in the last 72 hours. Lipid Profile No results for input(s): "CHOL", "HDL", "LDLCALC", "TRIG", "CHOLHDL", "LDLDIRECT" in the last 72 hours. Thyroid function studies Recent Labs    09/07/22 1430  TSH 1.197   Anemia work up No results for input(s): "VITAMINB12", "FOLATE", "FERRITIN", "TIBC", "IRON", "RETICCTPCT" in the last 72 hours. Microbiology No results found for this or any previous visit (from the past 240 hour(s)).  Time coordinating discharge: 35 minutes  Signed: Donn Zanetti  [facility] 09/09/2022, 11:58 AM

## 2022-12-12 ENCOUNTER — Emergency Department (HOSPITAL_COMMUNITY)

## 2022-12-12 ENCOUNTER — Encounter (HOSPITAL_COMMUNITY): Payer: Self-pay

## 2022-12-12 ENCOUNTER — Other Ambulatory Visit: Payer: Self-pay

## 2022-12-12 ENCOUNTER — Emergency Department (HOSPITAL_COMMUNITY)
Admission: EM | Admit: 2022-12-12 | Discharge: 2022-12-13 | Disposition: A | Discharge status: Home / Self Care | Attending: Emergency Medicine | Admitting: Emergency Medicine

## 2022-12-12 DIAGNOSIS — Z79899 Other long term (current) drug therapy: Secondary | ICD-10-CM | POA: Diagnosis not present

## 2022-12-12 DIAGNOSIS — I700 Atherosclerosis of aorta: Secondary | ICD-10-CM | POA: Diagnosis not present

## 2022-12-12 DIAGNOSIS — S0101XA Laceration without foreign body of scalp, initial encounter: Secondary | ICD-10-CM | POA: Diagnosis not present

## 2022-12-12 DIAGNOSIS — Z23 Encounter for immunization: Secondary | ICD-10-CM | POA: Insufficient documentation

## 2022-12-12 DIAGNOSIS — S0990XA Unspecified injury of head, initial encounter: Secondary | ICD-10-CM | POA: Diagnosis present

## 2022-12-12 DIAGNOSIS — R55 Syncope and collapse: Secondary | ICD-10-CM | POA: Diagnosis not present

## 2022-12-12 DIAGNOSIS — I7123 Aneurysm of the descending thoracic aorta, without rupture: Secondary | ICD-10-CM | POA: Insufficient documentation

## 2022-12-12 DIAGNOSIS — I7143 Infrarenal abdominal aortic aneurysm, without rupture: Secondary | ICD-10-CM | POA: Diagnosis not present

## 2022-12-12 DIAGNOSIS — W19XXXA Unspecified fall, initial encounter: Secondary | ICD-10-CM | POA: Insufficient documentation

## 2022-12-12 DIAGNOSIS — R071 Chest pain on breathing: Secondary | ICD-10-CM

## 2022-12-12 LAB — URINALYSIS, ROUTINE W REFLEX MICROSCOPIC
Bilirubin Urine: NEGATIVE
Glucose, UA: NEGATIVE mg/dL
Hgb urine dipstick: NEGATIVE
Ketones, ur: NEGATIVE mg/dL
Leukocytes,Ua: NEGATIVE
Nitrite: NEGATIVE
Protein, ur: NEGATIVE mg/dL
Specific Gravity, Urine: 1.008 (ref 1.005–1.030)
pH: 6 (ref 5.0–8.0)

## 2022-12-12 LAB — I-STAT CHEM 8, ED
BUN: 28 mg/dL — ABNORMAL HIGH (ref 8–23)
Calcium, Ion: 1.06 mmol/L — ABNORMAL LOW (ref 1.15–1.40)
Chloride: 107 mmol/L (ref 98–111)
Creatinine, Ser: 2.3 mg/dL — ABNORMAL HIGH (ref 0.61–1.24)
Glucose, Bld: 85 mg/dL (ref 70–99)
HCT: 31 % — ABNORMAL LOW (ref 39.0–52.0)
Hemoglobin: 10.5 g/dL — ABNORMAL LOW (ref 13.0–17.0)
Potassium: 4.4 mmol/L (ref 3.5–5.1)
Sodium: 137 mmol/L (ref 135–145)
TCO2: 20 mmol/L — ABNORMAL LOW (ref 22–32)

## 2022-12-12 MED ORDER — FENTANYL CITRATE PF 50 MCG/ML IJ SOSY
25.0000 ug | PREFILLED_SYRINGE | Freq: Once | INTRAMUSCULAR | Status: AC
  Administered 2022-12-12: 25 ug via INTRAVENOUS
  Filled 2022-12-12: qty 1

## 2022-12-12 MED ORDER — LIDOCAINE-EPINEPHRINE-TETRACAINE (LET) TOPICAL GEL
3.0000 mL | Freq: Once | TOPICAL | Status: AC
  Administered 2022-12-12: 3 mL via TOPICAL
  Filled 2022-12-12: qty 3

## 2022-12-12 MED ORDER — TETANUS-DIPHTH-ACELL PERTUSSIS 5-2.5-18.5 LF-MCG/0.5 IM SUSY
0.5000 mL | PREFILLED_SYRINGE | Freq: Once | INTRAMUSCULAR | Status: AC
  Administered 2022-12-12: 0.5 mL via INTRAMUSCULAR
  Filled 2022-12-12: qty 0.5

## 2022-12-12 MED ORDER — MECLIZINE HCL 25 MG PO TABS
25.0000 mg | ORAL_TABLET | Freq: Once | ORAL | Status: AC
  Administered 2022-12-12: 25 mg via ORAL
  Filled 2022-12-12: qty 1

## 2022-12-12 NOTE — Progress Notes (Signed)
Orthopedic Tech Progress Note Patient Details:  Isaac Stokes Jan 22, 1951 [id number]  Level 2 trauma   Patient ID: Isaac Stokes, male   DOB: [birth year], 72 y.o.   MRN: [id number]  Donald Pore 12/12/2022, 5:28 PM

## 2022-12-12 NOTE — ED Triage Notes (Signed)
Pt came from his apartment complex. Pt was visiting a friend in his apartment complex when he had a syncopal episode and fell over. Pt fell backwards and hit the back of  his head. Pt is also a Hospice pt. Pt has hx of strokes and has R shoulder pain. Pt also complains of dizziness. Pt also stutters which is normal for him.   EMS VS P 60 BP 140/80 O2 98% RA CBG 100

## 2022-12-12 NOTE — ED Notes (Signed)
Pt gone to CT 

## 2022-12-12 NOTE — ED Provider Notes (Signed)
[place] EMERGENCY DEPARTMENT AT [facility] Provider Note   CSN: [id number] Arrival date & time: 12/12/22  1658     History {Add pertinent medical, surgical, social history, OB history to HPI:1} Chief Complaint  Patient presents with   Fall    Fall on thinners/ level 2    Isaac Stokes is a 72 y.o. male.   Fall Associated symptoms include headaches.  Patient presents after a syncopal episode with fall.  This occurred while he was visiting a friend who resides in his same apartment complex.  Patient endorses dizziness today.  Prior to arrival, when standing, patient had a syncopal episode.  He fell backwards and struck the back of his head.  He is on Plavix.  EMS was called to the scene.  Patient has been alert and oriented with EMS.  They noted wounds to occiput and frontal scalp.  Patient endorses pain in these areas in addition to fingers of his right hand.  He denies any other current areas of discomfort.  Patient reports that he is on hospice.  When asked why, he states that it is due to a AAA as well as a "hole in his heart".  He has had a history of CVAs.     Home Medications Prior to Admission medications   Medication Sig Start Date End Date Taking? Authorizing Provider  gabapentin (NEURONTIN) 300 MG capsule Take 300 mg by mouth 3 (three) times daily.    [provider]  LEXAPRO 5 MG tablet Take 5 mg by mouth daily.    [provider]  LORazepam (ATIVAN) 0.5 MG tablet Take 0.5 mg by mouth every 4 (four) hours as needed for anxiety.    [provider]  tamsulosin (FLOMAX) 0.4 MG CAPS capsule Take 0.4 mg by mouth daily.    [provider]  traMADol (ULTRAM) 50 MG tablet Take 50 mg by mouth every 8 (eight) hours as needed for moderate pain or severe pain.    [provider]      Allergies    Patient has no known allergies.    Review of Systems   Review of Systems  Skin:  Positive for wound.  Neurological:   Positive for dizziness, syncope, light-headedness and headaches.  All other systems reviewed and are negative.   Physical Exam Updated Vital Signs There were no vitals taken for this visit. Physical Exam Vitals and nursing note reviewed.  Constitutional:      General: He is not in acute distress.    Appearance: Normal appearance. He is well-developed. He is not ill-appearing, toxic-appearing or diaphoretic.  HENT:     Head: Normocephalic.     Comments: Macerated skin to occiput and superficial laceration to frontal scalp.  Both wounds are hemostatic.    Right Ear: External ear normal.     Left Ear: External ear normal.     Nose: Nose normal.     Mouth/Throat:     Mouth: Mucous membranes are moist.  Eyes:     Extraocular Movements: Extraocular movements intact.     Conjunctiva/sclera: Conjunctivae normal.  Cardiovascular:     Rate and Rhythm: Normal rate and regular rhythm.  Pulmonary:     Effort: Pulmonary effort is normal. No respiratory distress.  Abdominal:     General: There is no distension.     Palpations: Abdomen is soft.     Tenderness: There is no abdominal tenderness.  Musculoskeletal:        General: No  swelling. Normal range of motion.     Cervical back: Normal range of motion and neck supple.     Right lower leg: No edema.     Left lower leg: No edema.  Skin:    General: Skin is warm and dry.     Coloration: Skin is not jaundiced or pale.  Neurological:     General: No focal deficit present.     Mental Status: He is alert and oriented to person, place, and time.     Cranial Nerves: No cranial nerve deficit.     Sensory: No sensory deficit.     Motor: No weakness.     Coordination: Coordination normal.  Psychiatric:        Mood and Affect: Mood normal.        Behavior: Behavior normal.        Thought Content: Thought content normal.        Judgment: Judgment normal.     ED Results / Procedures / Treatments   Labs (all labs ordered are listed, but  only abnormal results are displayed) Labs Reviewed - No data to display  EKG None  Radiology No results found.  Procedures Procedures  {Document cardiac monitor, telemetry assessment procedure when appropriate:1}  Medications Ordered in ED Medications - No data to display  ED Course/ Medical Decision Making/ A&P   {   Click here for ABCD2, HEART and other calculatorsREFRESH Note before signing :1}                          Medical Decision Making Amount and/or Complexity of Data Reviewed Labs: ordered. Radiology: ordered. ECG/medicine tests: ordered.  Risk Prescription drug management.   This patient presents to the ED for concern of syncope and fall, this involves an extensive number of treatment options, and is a complaint that carries with it a high risk of complications and morbidity.  The differential diagnosis includes arrhythmia, dehydration, hypoglycemia, other metabolic derangements, vasovagal episode, acute injuries   Co morbidities that complicate the patient evaluation  CVA, CKD, AAA, vertigo   Additional history obtained:  Additional history obtained from EMS External records from outside source obtained and reviewed including EMR   Lab Tests:  I Ordered, and personally interpreted labs.  The pertinent results include:  ***   Imaging Studies ordered:  I ordered imaging studies including ***  I independently visualized and interpreted imaging which showed *** I agree with the radiologist interpretation   Cardiac Monitoring: / EKG:  The patient was maintained on a cardiac monitor.  I personally viewed and interpreted the cardiac monitored which showed an underlying rhythm of: ***   Consultations Obtained:  I requested consultation with the ***,  and discussed lab and imaging findings as well as pertinent plan - they recommend: ***   Problem List / ED Course / Critical interventions / Medication management  Patient presents after syncope  and fall.  He is currently on Plavix and arrives as a level 2 trauma.  He is alert and oriented on arrival.  He has superficial wounds to his scalp which are hemostatic.  He has no focal neurologic deficits.  Only other area of pain he describes is in his right fingers and tailbone.  Right fingers do not show any swelling, deformity, or decreased range of motion.  Trauma workup was initiated.  Initial lab work showed baseline CKD with normal electrolytes.  Although CT imaging did not show any acute  injuries, it did show interval enlargement of infrarenal AAA.  Size increase is nearly 2 cm since 1 year ago.  There was some mild periaortic stranding.  I discussed this with vascular surgeon on-call, Dr. Chestine Spore.  Dr. Chestine Spore does not feel that this contributed to his episode of syncope.  He does not feel that any acute management is needed.  Patient to follow-up with his vascular surgeon at [facility], Dr. Pattricia Boss.  Patient was informed of CT imaging results.  Patient does recall conversations that he has had with Dr. Pattricia Boss regarding possible interventions.  In the past and currently, patient feels that he would not want any interventions.  He states that he knows that he is dying and his only goal is to become closer to God before he dies.  He remained hemodynamically stable while in the ED. I ordered medication including ***  for ***  Reevaluation of the patient after these medicines showed that the patient {resolved/improved/worsened:23923::"improved"} I have reviewed the patients home medicines and have made adjustments as needed   Social Determinants of Health:  ***   Test / Admission - Considered:  ***   {Document critical care time when appropriate:1} {Document review of labs and clinical decision tools ie heart score, Chads2Vasc2 etc:1}  {Document your independent review of radiology images, and any outside records:1} {Document your discussion with family members, caretakers, and with  consultants:1} {Document social determinants of health affecting pt's care:1} {Document your decision making why or why not admission, treatments were needed:1} Final Clinical Impression(s) / ED Diagnoses Final diagnoses:  None    Rx / DC Orders ED Discharge Orders     None

## 2022-12-13 LAB — CBG MONITORING, ED: Glucose-Capillary: 107 mg/dL — ABNORMAL HIGH (ref 70–99)

## 2022-12-13 LAB — COMPREHENSIVE METABOLIC PANEL
ALT: 10 U/L (ref 0–44)
AST: 15 U/L (ref 15–41)
Albumin: 3.4 g/dL — ABNORMAL LOW (ref 3.5–5.0)
Alkaline Phosphatase: 50 U/L (ref 38–126)
Anion gap: 11 (ref 5–15)
BUN: 30 mg/dL — ABNORMAL HIGH (ref 8–23)
CO2: 22 mmol/L (ref 22–32)
Calcium: 9 mg/dL (ref 8.9–10.3)
Chloride: 105 mmol/L (ref 98–111)
Creatinine, Ser: 2.19 mg/dL — ABNORMAL HIGH (ref 0.61–1.24)
GFR, Estimated: 31 mL/min — ABNORMAL LOW (ref >60)
Glucose, Bld: 65 mg/dL — ABNORMAL LOW (ref 70–99)
Potassium: 4.8 mmol/L (ref 3.5–5.1)
Sodium: 138 mmol/L (ref 135–145)
Total Bilirubin: 0.7 mg/dL (ref 0.3–1.2)
Total Protein: 6.5 g/dL (ref 6.5–8.1)

## 2022-12-13 LAB — PROTIME-INR
INR: 1.2 (ref 0.8–1.2)
Prothrombin Time: 15.2 seconds (ref 11.4–15.2)

## 2022-12-13 LAB — CBC
HCT: 32.3 % — ABNORMAL LOW (ref 39.0–52.0)
Hemoglobin: 10.3 g/dL — ABNORMAL LOW (ref 13.0–17.0)
MCH: 30.5 pg (ref 26.0–34.0)
MCHC: 31.9 g/dL (ref 30.0–36.0)
MCV: 95.6 fL (ref 80.0–100.0)
Platelets: 154 10*3/uL (ref 150–400)
RBC: 3.38 MIL/uL — ABNORMAL LOW (ref 4.22–5.81)
RDW: 13.4 % (ref 11.5–15.5)
WBC: 4.7 10*3/uL (ref 4.0–10.5)
nRBC: 0 % (ref 0.0–0.2)

## 2022-12-13 LAB — ETHANOL: Alcohol, Ethyl (B): <10 mg/dL (ref <10)

## 2022-12-13 LAB — MAGNESIUM: Magnesium: 2.3 mg/dL (ref 1.7–2.4)

## 2022-12-13 LAB — LACTIC ACID, PLASMA: Lactic Acid, Venous: 1.5 mmol/L (ref 0.5–1.9)

## 2022-12-13 NOTE — Discharge Instructions (Addendum)
Your CT scan today showed the following: "Interval increase in size of a bilobed infrarenal abdominal aorta aneurysm with the superior most part measuring 4 x 4 cm (from 3.9 x 3.6 cm) and the inferior-most part measuring 7.8 x 6.7 cm (from 6 x 7 cm). Limited evaluation on this noncontrast study. Associated slight periaortic fat stranding concerning for fast increasing diameter. No other findings of possible impending rupture. "  Call Dr. Pattricia Boss to discuss.  Return to the emergency department for any new or worsening symptoms of concern.

## 2023-01-16 ENCOUNTER — Ambulatory Visit (INDEPENDENT_AMBULATORY_CARE_PROVIDER_SITE_OTHER): Payer: Medicare Other | Admitting: Podiatry

## 2023-01-16 DIAGNOSIS — M79674 Pain in right toe(s): Secondary | ICD-10-CM

## 2023-01-16 DIAGNOSIS — B351 Tinea unguium: Secondary | ICD-10-CM | POA: Diagnosis not present

## 2023-01-16 DIAGNOSIS — M79675 Pain in left toe(s): Secondary | ICD-10-CM | POA: Diagnosis not present

## 2023-01-16 NOTE — Progress Notes (Signed)
       Subjective:  Patient ID: Isaac Stokes, male    DOB: Nov 26, 1950,  MRN: [id number]   Isaac Stokes presents to clinic today for:  Chief Complaint  Patient presents with   Nail Problem    RM 10 RFC bilateral nail trim.   . Patient notes nails are thick, discolored, elongated and painful in shoegear when trying to ambulate.  He is in a wheelchair today but states that he does walk with a walker at home.  He is able to transfer to exam chair.  However, today he remained in his wheelchair which required the podiatrist to sit on the floor to examine his feet.  Patient also requested that his fingernails be trimmed today.  It has been almost 2 years since the patient was last seen here  PCP is [facility]  No Known Allergies  Review of Systems: Negative except as noted in the HPI.  Objective:  There were no vitals filed for this visit.  Isaac Stokes is a pleasant 72 y.o. male in NAD. AAO x 3.  Vascular Examination: Capillary refill time is 3-5 seconds to toes bilateral. Palpable pedal pulses b/l LE. Digital hair present b/l. No pedal edema b/l. Skin temperature gradient WNL b/l. No varicosities b/l. No cyanosis or clubbing noted b/l.   Dermatological Examination: Pedal skin with normal turgor, texture and tone b/l. No open wounds. No interdigital macerations b/l. Toenails x10 are 5 - 10 mm thick, discolored, dystrophic with subungual debris. There is pain with compression of the nail plates.  They are significantly elongated x10  Neurological Examination: Protective sensation intact bilateral LE. Vibratory sensation intact bilateral LE.  Assessment/Plan: 1. Pain due to onychomycosis of toenails of both feet     The mycotic toenails were sharply debrided x10 with sterile nail nippers and a power debriding burr to decrease bulk/thickness and length.  Inform patient that podiatrist cannot trim the fingernails in the state of [place] as per our  license statutes.  He was informed that our scope of podiatry covers the foot and ankle only, in [place].  he will need to go to a nail salon or a dermatologist to have this performed  Return in about 3 months (around 04/18/2023) for RFC (he can transfer to exam chair from wheelchair).   Clerance Lav, DPM, FACFAS [facility]     [address] [place], [place] [address]                Office [phone]  Fax [phone]

## 2023-05-08 ENCOUNTER — Ambulatory Visit (INDEPENDENT_AMBULATORY_CARE_PROVIDER_SITE_OTHER): Payer: 59 | Admitting: Podiatry

## 2023-05-08 DIAGNOSIS — B351 Tinea unguium: Secondary | ICD-10-CM

## 2023-05-08 DIAGNOSIS — M79675 Pain in left toe(s): Secondary | ICD-10-CM | POA: Diagnosis not present

## 2023-05-08 DIAGNOSIS — M79674 Pain in right toe(s): Secondary | ICD-10-CM

## 2023-05-08 NOTE — Progress Notes (Unsigned)
    Subjective:  Patient ID: Isaac Stokes, male    DOB: 07/03/1951,  MRN: [id number]   Isaac Stokes presents to clinic today for:  Patient notes nails are thick, discolored, elongated and painful in shoegear when trying to ambulate.  Notes he is under hospice care for services.   PCP is [facility]  Past Medical History:  Diagnosis Date   AAA (abdominal aortic aneurysm) (HCC)    CVA (cerebral vascular accident) (HCC)    TIA (transient ischemic attack) 03/25/2021   3 this summer of 2022   Vertigo     Past Surgical History:  Procedure Laterality Date   BUBBLE STUDY  07/29/2021   Procedure: BUBBLE STUDY;  Surgeon: [name], MD;  Location: [facility] ENDOSCOPY;  Service: Cardiovascular;;   TEE WITHOUT CARDIOVERSION N/A 07/29/2021   Procedure: TRANSESOPHAGEAL ECHOCARDIOGRAM (TEE);  Surgeon: Elease Hashimoto Deloris Ping, MD;  Location: [facility] ENDOSCOPY;  Service: Cardiovascular;  Laterality: N/A;   No Known Allergies  Review of Systems: Negative except as noted in the HPI.  Objective:  There were no vitals filed for this visit.  Isaac Stokes is a pleasant 72 y.o. male in NAD. AAO x 3.  Vascular Examination: Capillary refill time is 3-5 seconds to toes bilateral. Palpable pedal pulses b/l LE. Digital hair present b/l.  Skin temperature gradient WNL b/l. No varicosities b/l. No cyanosis noted b/l.   Dermatological Examination: Pedal skin with normal turgor, texture and tone b/l. No open wounds. No interdigital macerations b/l. Toenails x9 are 3mm thick, discolored, dystrophic with subungual debris. There is pain with compression of the nail plates.  They are elongated x9. No right hallux nail today  Assessment/Plan: 1. Pain due to onychomycosis of toenails of both feet     The mycotic toenails were sharply debrided x9 with sterile nail nippers and a power debriding burr to decrease bulk/thickness and length.    Return in about 3 months (around 08/07/2023) for  RFC.   Clerance Lav, DPM, FACFAS [facility]     [address] [place], [place] [address]                Office [phone]  Fax [phone]

## 2023-08-07 ENCOUNTER — Ambulatory Visit (INDEPENDENT_AMBULATORY_CARE_PROVIDER_SITE_OTHER): Payer: 59 | Admitting: Podiatry

## 2023-08-07 ENCOUNTER — Encounter: Payer: Self-pay | Admitting: Podiatry

## 2023-08-07 DIAGNOSIS — B351 Tinea unguium: Secondary | ICD-10-CM | POA: Diagnosis not present

## 2023-08-07 DIAGNOSIS — M79675 Pain in left toe(s): Secondary | ICD-10-CM

## 2023-08-07 DIAGNOSIS — M79674 Pain in right toe(s): Secondary | ICD-10-CM

## 2023-08-07 NOTE — Progress Notes (Unsigned)
       Subjective:  Patient ID: Isaac Stokes, male    DOB: [birth year],  MRN: [id number]   [name] presents to clinic today for:  Chief Complaint  Patient presents with   Debridement    Trim toenails    Patient notes nails are thick, discolored, elongated and painful in shoegear when trying to ambulate.  Patient notes he is still receiving hospice care at home 6  Past Medical History:  Diagnosis Date   AAA (abdominal aortic aneurysm) (HCC)    CVA (cerebral vascular accident) (HCC)    TIA (transient ischemic attack) 03/25/2021   3 this summer of 2022   Vertigo     Past Surgical History:  Procedure Laterality Date   BUBBLE STUDY  07/29/2021   Procedure: BUBBLE STUDY;  Surgeon: [name], MD;  Location: [facility] ENDOSCOPY;  Service: Cardiovascular;;   TEE WITHOUT CARDIOVERSION N/A 07/29/2021   Procedure: TRANSESOPHAGEAL ECHOCARDIOGRAM (TEE);  Surgeon: Elease Hashimoto Deloris Ping, MD;  Location: [facility] ENDOSCOPY;  Service: Cardiovascular;  Laterality: N/A;    No Known Allergies  Review of Systems: Negative except as noted in the HPI.  Objective:  KYCE STEBER is a pleasant 72 y.o. male in NAD. AAO x 3.  Vascular Examination: Capillary refill time is 3-5 seconds to toes bilateral. Palpable pedal pulses b/l LE. Digital hair present b/l.  Skin temperature gradient WNL b/l. No varicosities b/l. No cyanosis noted b/l.   Dermatological Examination: Pedal skin with normal turgor, texture and tone b/l. No open wounds. No interdigital macerations b/l. Toenails x10 are 3mm thick, discolored, dystrophic with subungual debris. There is pain with compression of the nail plates.  They are elongated x10  Assessment/Plan: 1. Pain due to onychomycosis of toenails of both feet    The mycotic toenails were sharply debrided x10 with sterile nail nippers and a power debriding burr to decrease bulk/thickness and length.    Return in about 3 months (around 11/05/2023) for RFC.   Clerance Lav, DPM, FACFAS [facility]     [address] [place], [place] [address]                Office [phone]  Fax [phone]

## 2023-11-06 ENCOUNTER — Ambulatory Visit (INDEPENDENT_AMBULATORY_CARE_PROVIDER_SITE_OTHER): Payer: 59 | Admitting: Podiatry

## 2023-11-06 ENCOUNTER — Encounter: Payer: Self-pay | Admitting: Podiatry

## 2023-11-06 VITALS — Ht 72.0 in | Wt 139.0 lb

## 2023-11-06 DIAGNOSIS — M79674 Pain in right toe(s): Secondary | ICD-10-CM

## 2023-11-06 DIAGNOSIS — B351 Tinea unguium: Secondary | ICD-10-CM

## 2023-11-06 DIAGNOSIS — M79675 Pain in left toe(s): Secondary | ICD-10-CM

## 2023-11-06 NOTE — Progress Notes (Unsigned)
       Subjective:  Patient ID: Isaac Stokes, male    DOB: 19-Oct-1950,  MRN: [id number]   Isaac Stokes presents to clinic today for:  Chief Complaint  Patient presents with   RFC    " I am here for my foot nail trim"    Patient notes nails are thick, discolored, elongated and painful in shoegear when trying to ambulate.  He also requested fingernail trimming today.  He stated that another provider at this practice cut his fingernails without issue.  Informed the patient that is not within our scope of care under the [place] and we would be at risk for losing her license if we perform any care above the foot and ankle.  He notes he still under hospice care.  His brother brought him to his appointment today.  When asked if the hospice nurse or his brother could cut his fingernails, he stated he had never asked.  Past Medical History:  Diagnosis Date   AAA (abdominal aortic aneurysm) (HCC)    CVA (cerebral vascular accident) (HCC)    TIA (transient ischemic attack) 03/25/2021   3 this summer of 2022   Vertigo     Past Surgical History:  Procedure Laterality Date   BUBBLE STUDY  07/29/2021   Procedure: BUBBLE STUDY;  Surgeon: [name], MD;  Location: [facility] ENDOSCOPY;  Service: Cardiovascular;;   TEE WITHOUT CARDIOVERSION N/A 07/29/2021   Procedure: TRANSESOPHAGEAL ECHOCARDIOGRAM (TEE);  Surgeon: Elease Hashimoto Deloris Ping, MD;  Location: [facility] ENDOSCOPY;  Service: Cardiovascular;  Laterality: N/A;    No Known Allergies  Review of Systems: Negative except as noted in the HPI.  Objective:  [name] is a pleasant 73 y.o. male in NAD. AAO x 3.  Vascular Examination: Capillary refill time is 3-5 seconds to toes bilateral. Palpable pedal pulses b/l LE. Digital hair present b/l.  Skin temperature gradient WNL b/l. No varicosities b/l. No cyanosis noted b/l.   Dermatological Examination: Pedal skin with normal turgor, texture and tone b/l. No open wounds. No  interdigital macerations b/l. Toenails x10 are 3mm thick, discolored, dystrophic with subungual debris. There is pain with compression of the nail plates.  They are elongated x10  Assessment/Plan: 1. Pain due to onychomycosis of toenails of both feet    The mycotic toenails were sharply debrided x10 with sterile nail nippers and a power debriding burr to decrease bulk/thickness and length.    Once again at the end of his appointment, he requested that I trim his fingernails.  He stated "it would be are little secret".  Informed the patient that I will not risk my license for any patient's request that is beyond my scope of practice.  He can either follow-up at a nail salon, request the service from the hospice nurse, or ask his brother to perform this care for him at home.  Return in about 3 months (around 02/06/2024) for RFC.   Clerance Lav, DPM, FACFAS [facility]     [address] [place], [place] [address]                Office [phone]  Fax [phone]

## 2024-01-11 ENCOUNTER — Inpatient Hospital Stay (HOSPITAL_COMMUNITY)
Admission: EM | Admit: 2024-01-11 | Discharge: 2024-01-18 | DRG: 698 | Disposition: A | Discharge status: Skilled Nursing Facility | Attending: Internal Medicine | Admitting: Internal Medicine

## 2024-01-11 ENCOUNTER — Emergency Department (HOSPITAL_COMMUNITY)

## 2024-01-11 ENCOUNTER — Encounter (HOSPITAL_COMMUNITY): Payer: Self-pay

## 2024-01-11 ENCOUNTER — Other Ambulatory Visit: Payer: Self-pay

## 2024-01-11 DIAGNOSIS — N400 Enlarged prostate without lower urinary tract symptoms: Secondary | ICD-10-CM | POA: Diagnosis present

## 2024-01-11 DIAGNOSIS — Z66 Do not resuscitate: Secondary | ICD-10-CM | POA: Diagnosis present

## 2024-01-11 DIAGNOSIS — I7143 Infrarenal abdominal aortic aneurysm, without rupture: Secondary | ICD-10-CM | POA: Diagnosis present

## 2024-01-11 DIAGNOSIS — I9589 Other hypotension: Secondary | ICD-10-CM | POA: Diagnosis present

## 2024-01-11 DIAGNOSIS — N179 Acute kidney failure, unspecified: Secondary | ICD-10-CM | POA: Diagnosis present

## 2024-01-11 DIAGNOSIS — D696 Thrombocytopenia, unspecified: Secondary | ICD-10-CM | POA: Diagnosis present

## 2024-01-11 DIAGNOSIS — A4159 Other Gram-negative sepsis: Secondary | ICD-10-CM | POA: Diagnosis present

## 2024-01-11 DIAGNOSIS — B9620 Unspecified Escherichia coli [E. coli] as the cause of diseases classified elsewhere: Secondary | ICD-10-CM | POA: Diagnosis not present

## 2024-01-11 DIAGNOSIS — Z8673 Personal history of transient ischemic attack (TIA), and cerebral infarction without residual deficits: Secondary | ICD-10-CM | POA: Diagnosis not present

## 2024-01-11 DIAGNOSIS — N1832 Chronic kidney disease, stage 3b: Secondary | ICD-10-CM | POA: Diagnosis present

## 2024-01-11 DIAGNOSIS — N390 Urinary tract infection, site not specified: Secondary | ICD-10-CM | POA: Diagnosis present

## 2024-01-11 DIAGNOSIS — I5032 Chronic diastolic (congestive) heart failure: Secondary | ICD-10-CM | POA: Diagnosis present

## 2024-01-11 DIAGNOSIS — Z7902 Long term (current) use of antithrombotics/antiplatelets: Secondary | ICD-10-CM

## 2024-01-11 DIAGNOSIS — F17210 Nicotine dependence, cigarettes, uncomplicated: Secondary | ICD-10-CM | POA: Diagnosis present

## 2024-01-11 DIAGNOSIS — T83518A Infection and inflammatory reaction due to other urinary catheter, initial encounter: Principal | ICD-10-CM | POA: Diagnosis present

## 2024-01-11 DIAGNOSIS — A4151 Sepsis due to Escherichia coli [E. coli]: Secondary | ICD-10-CM | POA: Diagnosis present

## 2024-01-11 DIAGNOSIS — Y846 Urinary catheterization as the cause of abnormal reaction of the patient, or of later complication, without mention of misadventure at the time of the procedure: Secondary | ICD-10-CM | POA: Diagnosis present

## 2024-01-11 DIAGNOSIS — R933 Abnormal findings on diagnostic imaging of other parts of digestive tract: Secondary | ICD-10-CM

## 2024-01-11 DIAGNOSIS — A419 Sepsis, unspecified organism: Secondary | ICD-10-CM | POA: Diagnosis present

## 2024-01-11 DIAGNOSIS — Z79899 Other long term (current) drug therapy: Secondary | ICD-10-CM

## 2024-01-11 DIAGNOSIS — R6520 Severe sepsis without septic shock: Secondary | ICD-10-CM | POA: Diagnosis present

## 2024-01-11 DIAGNOSIS — E872 Acidosis, unspecified: Secondary | ICD-10-CM | POA: Diagnosis present

## 2024-01-11 DIAGNOSIS — E222 Syndrome of inappropriate secretion of antidiuretic hormone: Secondary | ICD-10-CM | POA: Diagnosis present

## 2024-01-11 DIAGNOSIS — D631 Anemia in chronic kidney disease: Secondary | ICD-10-CM | POA: Diagnosis present

## 2024-01-11 DIAGNOSIS — R7881 Bacteremia: Secondary | ICD-10-CM | POA: Diagnosis not present

## 2024-01-11 DIAGNOSIS — I714 Abdominal aortic aneurysm, without rupture, unspecified: Secondary | ICD-10-CM | POA: Diagnosis not present

## 2024-01-11 DIAGNOSIS — B961 Klebsiella pneumoniae [K. pneumoniae] as the cause of diseases classified elsewhere: Secondary | ICD-10-CM | POA: Diagnosis not present

## 2024-01-11 DIAGNOSIS — M7989 Other specified soft tissue disorders: Secondary | ICD-10-CM | POA: Diagnosis not present

## 2024-01-11 DIAGNOSIS — D649 Anemia, unspecified: Secondary | ICD-10-CM | POA: Diagnosis not present

## 2024-01-11 LAB — COMPREHENSIVE METABOLIC PANEL WITH GFR
ALT: 15 U/L (ref 0–44)
AST: 30 U/L (ref 15–41)
Albumin: 2.4 g/dL — ABNORMAL LOW (ref 3.5–5.0)
Alkaline Phosphatase: 42 U/L (ref 38–126)
Anion gap: 11 (ref 5–15)
BUN: 64 mg/dL — ABNORMAL HIGH (ref 8–23)
CO2: 17 mmol/L — ABNORMAL LOW (ref 22–32)
Calcium: 7.6 mg/dL — ABNORMAL LOW (ref 8.9–10.3)
Chloride: 107 mmol/L (ref 98–111)
Creatinine, Ser: 3.84 mg/dL — ABNORMAL HIGH (ref 0.61–1.24)
GFR, Estimated: 16 mL/min — ABNORMAL LOW (ref >60)
Glucose, Bld: 80 mg/dL (ref 70–99)
Potassium: 5.1 mmol/L (ref 3.5–5.1)
Sodium: 135 mmol/L (ref 135–145)
Total Bilirubin: 0.5 mg/dL (ref 0.0–1.2)
Total Protein: 5.4 g/dL — ABNORMAL LOW (ref 6.5–8.1)

## 2024-01-11 LAB — URINALYSIS, W/ REFLEX TO CULTURE (INFECTION SUSPECTED)
Bilirubin Urine: NEGATIVE
Glucose, UA: NEGATIVE mg/dL
Ketones, ur: NEGATIVE mg/dL
Nitrite: NEGATIVE
Protein, ur: 100 mg/dL — AB
RBC / HPF: >50 RBC/hpf (ref 0–5)
Specific Gravity, Urine: 1.015 (ref 1.005–1.030)
WBC, UA: >50 WBC/hpf (ref 0–5)
pH: 5 (ref 5.0–8.0)

## 2024-01-11 LAB — ABO/RH: ABO/RH(D): O POS

## 2024-01-11 LAB — CBC WITH DIFFERENTIAL/PLATELET
Abs Immature Granulocytes: 0.53 10*3/uL — ABNORMAL HIGH (ref 0.00–0.07)
Basophils Absolute: 0.1 10*3/uL (ref 0.0–0.1)
Basophils Relative: 0 %
Eosinophils Absolute: 0 10*3/uL (ref 0.0–0.5)
Eosinophils Relative: 0 %
HCT: 22.7 % — ABNORMAL LOW (ref 39.0–52.0)
Hemoglobin: 7.2 g/dL — ABNORMAL LOW (ref 13.0–17.0)
Immature Granulocytes: 3 %
Lymphocytes Relative: 5 %
Lymphs Abs: 0.9 10*3/uL (ref 0.7–4.0)
MCH: 31.2 pg (ref 26.0–34.0)
MCHC: 31.7 g/dL (ref 30.0–36.0)
MCV: 98.3 fL (ref 80.0–100.0)
Monocytes Absolute: 0.8 10*3/uL (ref 0.1–1.0)
Monocytes Relative: 4 %
Neutro Abs: 17.9 10*3/uL — ABNORMAL HIGH (ref 1.7–7.7)
Neutrophils Relative %: 88 %
Platelets: 95 10*3/uL — ABNORMAL LOW (ref 150–400)
RBC: 2.31 MIL/uL — ABNORMAL LOW (ref 4.22–5.81)
RDW: 16 % — ABNORMAL HIGH (ref 11.5–15.5)
Smear Review: DECREASED
WBC: 20.2 10*3/uL — ABNORMAL HIGH (ref 4.0–10.5)
nRBC: 0 % (ref 0.0–0.2)

## 2024-01-11 LAB — TROPONIN I (HIGH SENSITIVITY)
Troponin I (High Sensitivity): 48 ng/L — ABNORMAL HIGH (ref <18)
Troponin I (High Sensitivity): 34 ng/L — ABNORMAL HIGH (ref <18)

## 2024-01-11 LAB — I-STAT CG4 LACTIC ACID, ED
Lactic Acid, Venous: 3.3 mmol/L (ref 0.5–1.9)
Lactic Acid, Venous: 4.5 mmol/L (ref 0.5–1.9)

## 2024-01-11 LAB — PROTIME-INR
INR: 1.5 — ABNORMAL HIGH (ref 0.8–1.2)
Prothrombin Time: 18.4 s — ABNORMAL HIGH (ref 11.4–15.2)

## 2024-01-11 MED ORDER — POLYETHYLENE GLYCOL 3350 17 G PO PACK: 17.0000 g | PACK | Freq: Every day | ORAL | Status: DC | PRN

## 2024-01-11 MED ORDER — IPRATROPIUM-ALBUTEROL 0.5-2.5 (3) MG/3ML IN SOLN
3.0000 mL | Freq: Four times a day (QID) | RESPIRATORY_TRACT | Status: DC
  Administered 2024-01-11 – 2024-01-12 (×2): 3 mL via RESPIRATORY_TRACT
  Filled 2024-01-11 (×2): qty 3

## 2024-01-11 MED ORDER — MIDODRINE HCL 5 MG PO TABS: 10.0000 mg | ORAL_TABLET | Freq: Three times a day (TID) | ORAL | Status: DC

## 2024-01-11 MED ORDER — SODIUM CHLORIDE 0.9 % IV BOLUS
500.0000 mL | Freq: Once | INTRAVENOUS | Status: AC
  Administered 2024-01-11: 500 mL via INTRAVENOUS

## 2024-01-11 MED ORDER — MELATONIN 5 MG PO TABS
5.0000 mg | ORAL_TABLET | Freq: Every evening | ORAL | Status: DC | PRN
  Administered 2024-01-16: 5 mg via ORAL
  Filled 2024-01-11: qty 1

## 2024-01-11 MED ORDER — SODIUM CHLORIDE 0.9 % IV BOLUS
1000.0000 mL | Freq: Once | INTRAVENOUS | Status: AC
  Administered 2024-01-11: 1000 mL via INTRAVENOUS

## 2024-01-11 MED ORDER — SODIUM CHLORIDE 0.9 % IV SOLN
2.0000 g | Freq: Once | INTRAVENOUS | Status: AC
  Administered 2024-01-11: 2 g via INTRAVENOUS
  Filled 2024-01-11: qty 12.5

## 2024-01-11 MED ORDER — SODIUM CHLORIDE 0.9 % IV SOLN: INTRAVENOUS | Status: DC

## 2024-01-11 MED ORDER — VANCOMYCIN HCL 1.25 G IV SOLR
1250.0000 mg | Freq: Once | INTRAVENOUS | Status: AC
  Administered 2024-01-11: 1250 mg via INTRAVENOUS
  Filled 2024-01-11: qty 25

## 2024-01-11 MED ORDER — PROCHLORPERAZINE EDISYLATE 10 MG/2ML IJ SOLN: 5.0000 mg | Freq: Four times a day (QID) | INTRAMUSCULAR | Status: DC | PRN

## 2024-01-11 MED ORDER — MIDODRINE HCL 5 MG PO TABS
10.0000 mg | ORAL_TABLET | ORAL | Status: AC
  Administered 2024-01-11: 10 mg via ORAL
  Filled 2024-01-11: qty 2

## 2024-01-11 MED ORDER — ACETAMINOPHEN 325 MG PO TABS
650.0000 mg | ORAL_TABLET | Freq: Four times a day (QID) | ORAL | Status: DC | PRN
  Administered 2024-01-16: 650 mg via ORAL
  Filled 2024-01-11: qty 2

## 2024-01-11 NOTE — ED Notes (Signed)
 Pt very combative and pulling at lines and removing oxygen. Attempted to administer medication but patient refused everything.

## 2024-01-11 NOTE — ED Notes (Signed)
 EDP notified of patient arrival and hypotensive BP. EDP at bedside.

## 2024-01-11 NOTE — ED Notes (Signed)
 MD aware of BP

## 2024-01-11 NOTE — Progress Notes (Addendum)
 [facility] liaison note     This patient is a current hospice patient with [facility]. Please see media tab for detailed hospice report.    Liaison will continue to follow for any discharge planning needs and to coordinate continuation of hospice care.    Please don't hesitate to call with any Hospice related questions or concerns.    Thank you for the opportunity to participate in this patient's care.  Madelene Schanz, BSN, RN, OCN [employer] [phone]

## 2024-01-11 NOTE — ED Provider Notes (Signed)
 [place] EMERGENCY DEPARTMENT AT [facility] Provider Note   CSN: [id number] Arrival date & time: 01/11/24  1633     History  Chief Complaint  Patient presents with   Hypotension   Weakness    Isaac Stokes is a 73 y.o. male.  This is a 73 year old male presents the emergency department for weakness.  Progressive over the past several days.  He is supposedly hospice, has a MOST form with him indicating DNR/DNI and minimal interventions with IV fluids and antibiotics.  Chronic indwelling Foley, hematuria noted.  Patient alert and oriented x 3, but it does seemingly appear confused.  Denies pain.  Complains of generalized weakness.   Weakness      Home Medications Prior to Admission medications   Medication Sig Start Date End Date Taking? Authorizing Provider  clopidogrel  (PLAVIX ) 75 MG tablet Take 75 mg by mouth daily. 02/27/23   [provider]  gabapentin (NEURONTIN) 300 MG capsule Take 300 mg by mouth 3 (three) times daily.    [provider]  hydrOXYzine (ATARAX) 25 MG tablet Take 25 mg by mouth every 4 (four) hours as needed. 06/08/23   [provider]  LEXAPRO 5 MG tablet Take 5 mg by mouth daily.    [provider]  LORazepam  (ATIVAN ) 0.5 MG tablet Take 0.5 mg by mouth every 4 (four) hours as needed for anxiety.    [provider]  midodrine  (PROAMATINE ) 10 MG tablet 1 tablet Orally Twice a day for 30 day(s)    [provider]  tamsulosin (FLOMAX) 0.4 MG CAPS capsule Take 0.4 mg by mouth daily.    [provider]  traMADol (ULTRAM) 50 MG tablet Take 50 mg by mouth every 8 (eight) hours as needed for moderate pain or severe pain.    [provider]      Allergies    Patient has no known allergies.    Review of Systems   Review of Systems  Neurological:  Positive for weakness.    Physical Exam Updated Vital Signs BP (!) 125/108   Pulse 93   Temp 97.7 F (36.5 C) (Oral)   Resp  16   SpO2 96%  Physical Exam Vitals and nursing note reviewed.  Constitutional:      General: He is not in acute distress.    Appearance: He is not toxic-appearing.  HENT:     Head: Normocephalic.     Nose: Nose normal.     Mouth/Throat:     Mouth: Mucous membranes are dry.  Eyes:     Conjunctiva/sclera: Conjunctivae normal.     Pupils: Pupils are equal, round, and reactive to light.  Cardiovascular:     Rate and Rhythm: Normal rate and regular rhythm.  Pulmonary:     Effort: Pulmonary effort is normal. No respiratory distress.     Breath sounds: Normal breath sounds. No wheezing or rhonchi.  Abdominal:     General: Abdomen is flat. There is no distension.     Palpations: Abdomen is soft.     Tenderness: There is no abdominal tenderness. There is no guarding or rebound.  Musculoskeletal:        General: Normal range of motion.     Cervical back: Normal range of motion.  Skin:    General: Skin is warm and dry.     Capillary Refill: Capillary refill takes less than 2 seconds.  Neurological:     General: No focal deficit present.  Mental Status: He is alert and oriented to person, place, and time.  Psychiatric:        Mood and Affect: Mood normal.        Behavior: Behavior normal.     ED Results / Procedures / Treatments   Labs (all labs ordered are listed, but only abnormal results are displayed) Labs Reviewed  COMPREHENSIVE METABOLIC PANEL WITH GFR - Abnormal; Notable for the following components:      Result Value   CO2 17 (*)    BUN 64 (*)    Creatinine, Ser 3.84 (*)    Calcium  7.6 (*)    Total Protein 5.4 (*)    Albumin 2.4 (*)    GFR, Estimated 16 (*)    All other components within normal limits  CBC WITH DIFFERENTIAL/PLATELET - Abnormal; Notable for the following components:   WBC 20.2 (*)    RBC 2.31 (*)    Hemoglobin 7.2 (*)    HCT 22.7 (*)    RDW 16.0 (*)    Platelets 95 (*)    Neutro Abs 17.9 (*)    Abs Immature Granulocytes 0.53 (*)    All  other components within normal limits  PROTIME-INR - Abnormal; Notable for the following components:   Prothrombin Time 18.4 (*)    INR 1.5 (*)    All other components within normal limits  URINALYSIS, W/ REFLEX TO CULTURE (INFECTION SUSPECTED) - Abnormal; Notable for the following components:   Color, Urine AMBER (*)    APPearance CLOUDY (*)    Hgb urine dipstick LARGE (*)    Protein, ur 100 (*)    Leukocytes,Ua MODERATE (*)    Bacteria, UA MANY (*)    All other components within normal limits  I-STAT CG4 LACTIC ACID, ED - Abnormal; Notable for the following components:   Lactic Acid, Venous 3.3 (*)    All other components within normal limits  I-STAT CG4 LACTIC ACID, ED - Abnormal; Notable for the following components:   Lactic Acid, Venous 4.5 (*)    All other components within normal limits  TROPONIN I (HIGH SENSITIVITY) - Abnormal; Notable for the following components:   Troponin I (High Sensitivity) 48 (*)    All other components within normal limits  TROPONIN I (HIGH SENSITIVITY) - Abnormal; Notable for the following components:   Troponin I (High Sensitivity) 34 (*)    All other components within normal limits  CULTURE, BLOOD (ROUTINE X 2)  CULTURE, BLOOD (ROUTINE X 2)  URINE CULTURE  OCCULT BLOOD X 1 CARD TO LAB, STOOL  TYPE AND SCREEN  ABO/RH    EKG EKG Interpretation Date/Time:  Thursday Jan 11 2024 17:02:04 EDT Ventricular Rate:  85 PR Interval:  54 QRS Duration:  94 QT Interval:  370 QTC Calculation: 440 R Axis:   -13  Text Interpretation: Sinus rhythm Short PR interval Low voltage, extremity leads Confirmed by Elise Guile [phone]) on 01/11/2024 5:12:04 PM  Radiology DG Chest Port 1 View Result Date: 01/11/2024 CLINICAL DATA:  Sepsis. EXAM: PORTABLE CHEST 1 VIEW COMPARISON:  Chest CT dated 12/12/2022. FINDINGS: Faint bibasilar interstitial densities may represent atelectasis. Atypical infiltrate is not excluded. No consolidative changes. There is no pleural  effusion or pneumothorax. The cardiac silhouette is within normal limits. Atherosclerotic calcification of the aorta. No acute osseous pathology. IMPRESSION: Faint bibasilar atelectasis versus infiltrate. No consolidative changes. Electronically Signed   By: Angus Bark M.D.   On: 01/11/2024 17:38    Procedures .Critical Care  Performed by: Rolinda Climes, DO Authorized by: Rolinda Climes, DO   Critical care provider statement:    Critical care time (minutes):  75   Critical care was necessary to treat or prevent imminent or life-threatening deterioration of the following conditions:  Shock and sepsis   Critical care was time spent personally by me on the following activities:  Development of treatment plan with patient or surrogate, discussions with consultants, evaluation of patient's response to treatment, examination of patient, ordering and review of laboratory studies, ordering and review of radiographic studies, ordering and performing treatments and interventions, pulse oximetry, re-evaluation of patient's condition and review of old charts   Care discussed with: admitting provider       Medications Ordered in ED Medications  midodrine  (PROAMATINE ) tablet 10 mg (has no administration in time range)  midodrine  (PROAMATINE ) tablet 10 mg (has no administration in time range)  acetaminophen  (TYLENOL ) tablet 650 mg (has no administration in time range)  prochlorperazine (COMPAZINE) injection 5 mg (has no administration in time range)  polyethylene glycol (MIRALAX / GLYCOLAX) packet 17 g (has no administration in time range)  melatonin tablet 5 mg (has no administration in time range)  ipratropium-albuterol (DUONEB) 0.5-2.5 (3) MG/3ML nebulizer solution 3 mL (has no administration in time range)  0.9 %  sodium chloride  infusion (has no administration in time range)  sodium chloride  0.9 % bolus 500 mL (0 mLs Intravenous Stopped 01/11/24 1834)  ceFEPIme (MAXIPIME) 2 g in sodium  chloride 0.9 % 100 mL IVPB (0 g Intravenous Stopped 01/11/24 1816)  sodium chloride  0.9 % bolus 1,000 mL (0 mLs Intravenous Stopped 01/11/24 1834)  sodium chloride  0.9 % bolus 500 mL (0 mLs Intravenous Stopped 01/11/24 1955)  Vancomycin (VANCOCIN) 1,250 mg in sodium chloride  0.9 % 250 mL IVPB (0 mg Intravenous Stopped 01/11/24 2103)    ED Course/ Medical Decision Making/ A&P Clinical Course as of 01/11/24 2143  Thu Jan 11, 2024  1728 Lactic Acid, Venous(!!): 3.3 He is hypotensive.  Has indwelling Foley.  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  Presented the emergency department for generalized weakness.  Hypotensive on arrival 78/52.  Nontachycardic.  Borderline hypothermic with a temp of 36.5.  Reviewed MOST form with patient does want to be DNR/DNI; he is somewhat confused, but does seem to have capacity and understand what DNR/DNI means.  Patient's brother is at bedside and after discussion regarding goals of care they would be open to vasopressor medications if required.  His blood pressure has improved with IV fluids.  He is meeting sepsis criteria with his low blood pressure, hypothermia, has a white count of 20.2 and uptrending lactate.  Chest x-ray with possible infiltrate, but given his chronic Foley was treated empirically.  Foley exchange appears to have urinary tract infection.  Considered CT head, however patient without focal neurologic deficits and appears more encephalopathic likely secondary to sepsis.  Will forego CT head at this time.  Case discussed with hospitalist agrees to admit patient.  Amount and/or Complexity of Data Reviewed External Data Reviewed:  Details: Per chart review has a  history of intracranial hemorrhage Labs: ordered. Decision-making details documented in ED Course. Radiology: ordered and independent interpretation performed.    Details: Chest x-ray with no pneumothorax my independent interpretation ECG/medicine tests:     Details: Appears to be normal sinus rhythm.  Risk Decision regarding hospitalization. Decision not to resuscitate or to de-escalate care because of poor prognosis. Diagnosis or treatment significantly limited by social determinants of health.         Final Clinical Impression(s) / ED Diagnoses Final diagnoses:  None    Rx / DC Orders ED Discharge Orders     None         Rolinda Climes, DO 01/11/24 2143

## 2024-01-11 NOTE — H&P (Incomplete)
 History and Physical  Isaac Stokes ZOX:[id number] DOB: May 28, 1951 DOA: 01/11/2024  Referring physician: Dr. Linder Revere, EDP  PCP: [facility]  Outpatient Specialists: [facility] [place]. Patient coming from: Home  Chief Complaint: Generalized weakness  HPI: Isaac Stokes is a 73 y.o. male with medical history significant for infrarenal abdominal aortic aneurysm measuring 6.6 cm, prior CVA, chronic hypotension on midodrine , BPH, chronic HFpEF, current smoker, who presents to the ER from home via EMS due to generalized weakness.  States he has not been feeling well today.  EMS was activated by hospice nurse due to hypotension and increasing generalized weakness.  Upon EMS arrival, the patient was noted to be hypotensive with SBP in the 60s.  He received normal sinus bolus 500 cc x 1 en route.  In the ER, code sepsis was called secondary to UTI.  Leukocytosis 20.2 K, lactic acidosis 4.5, and UA positive for pyuria.  Additionally, there was a concern for possible pneumonia on chest x-ray.  The patient received IV cefepime and IV vancomycin as well as total 2 L NS IV fluid boluses.  [facility], hospitalist service, was asked to admit.  At the time of this visit, the patient is alert and oriented x 2.  He is accompanied by his brother at bedside.   ED Course: Temperature 99.  BP 91/53, pulse 85, respiratory 22, O2 saturation 100% on 2 L [place].    Review of Systems: Review of systems as noted in the HPI. All other systems reviewed and are negative.   Past Medical History:  Diagnosis Date   AAA (abdominal aortic aneurysm) (HCC)    CVA (cerebral vascular accident) (HCC)    TIA (transient ischemic attack) 03/25/2021   3 this summer of 2022   Vertigo    Past Surgical History:  Procedure Laterality Date   BUBBLE STUDY  07/29/2021   Procedure: BUBBLE STUDY;  Surgeon: Lake Pilgrim, MD;  Location: [facility] ENDOSCOPY;  Service: Cardiovascular;;   TEE WITHOUT CARDIOVERSION N/A  07/29/2021   Procedure: TRANSESOPHAGEAL ECHOCARDIOGRAM (TEE);  Surgeon: Alroy Aspen Lela Purple, MD;  Location: [facility] ENDOSCOPY;  Service: Cardiovascular;  Laterality: N/A;    Social History:  reports that he has been smoking cigarettes. He has never used smokeless tobacco. He reports that he does not drink alcohol and does not use drugs.   No Known Allergies  Family history: None reported.  Prior to Admission medications   Medication Sig Start Date End Date Taking? Authorizing Provider  clopidogrel  (PLAVIX ) 75 MG tablet Take 75 mg by mouth daily. 02/27/23   [provider]  gabapentin (NEURONTIN) 300 MG capsule Take 300 mg by mouth 3 (three) times daily.    [provider]  hydrOXYzine (ATARAX) 25 MG tablet Take 25 mg by mouth every 4 (four) hours as needed. 06/08/23   [provider]  LEXAPRO 5 MG tablet Take 5 mg by mouth daily.    [provider]  LORazepam  (ATIVAN ) 0.5 MG tablet Take 0.5 mg by mouth every 4 (four) hours as needed for anxiety.    [provider]  midodrine  (PROAMATINE ) 10 MG tablet 1 tablet Orally Twice a day for 30 day(s)    [provider]  tamsulosin (FLOMAX) 0.4 MG CAPS capsule Take 0.4 mg by mouth daily.    [provider]  traMADol (ULTRAM) 50 MG tablet Take 50 mg by mouth every 8 (eight) hours as needed for moderate pain or severe pain.    [provider]  Physical Exam: BP (!) 91/53   Pulse 85   Temp 99 F (37.2 C) (Oral)   Resp (!) 22   SpO2 100%   General: 73 y.o. year-old male well developed well nourished in no acute distress.  Alert and oriented x2. Cardiovascular: Regular rate and rhythm with no rubs or gallops.  No thyromegaly or JVD noted.  Left lower extremity edema and tenderness with palpation. Respiratory: Mild rales at bases.  Poor inspiratory effort. Abdomen: Soft nontender nondistended with normal bowel sounds x4 quadrants. Muskuloskeletal: No cyanosis or clubbing noted  bilaterally Neuro: CN II-XII intact, strength, sensation, reflexes Skin: No ulcerative lesions noted or rashes Psychiatry: Mood is appropriate for condition and setting          Labs on Admission:  Basic Metabolic Panel: Recent Labs  Lab 01/11/24 1647  NA 135  K 5.1  CL 107  CO2 17*  GLUCOSE 80  BUN 64*  CREATININE 3.84*  CALCIUM  7.6*   Liver Function Tests: Recent Labs  Lab 01/11/24 1647  AST 30  ALT 15  ALKPHOS 42  BILITOT 0.5  PROT 5.4*  ALBUMIN 2.4*   No results for input(s): "LIPASE", "AMYLASE" in the last 168 hours. No results for input(s): "AMMONIA" in the last 168 hours. CBC: Recent Labs  Lab 01/11/24 1647  WBC 20.2*  NEUTROABS 17.9*  HGB 7.2*  HCT 22.7*  MCV 98.3  PLT 95*   Cardiac Enzymes: No results for input(s): "CKTOTAL", "CKMB", "CKMBINDEX", "TROPONINI" in the last 168 hours.  BNP (last 3 results) No results for input(s): "BNP" in the last 8760 hours.  ProBNP (last 3 results) No results for input(s): "PROBNP" in the last 8760 hours.  CBG: No results for input(s): "GLUCAP" in the last 168 hours.  Radiological Exams on Admission: DG Chest Port 1 View Result Date: 01/11/2024 CLINICAL DATA:  Sepsis. EXAM: PORTABLE CHEST 1 VIEW COMPARISON:  Chest CT dated 12/12/2022. FINDINGS: Faint bibasilar interstitial densities may represent atelectasis. Atypical infiltrate is not excluded. No consolidative changes. There is no pleural effusion or pneumothorax. The cardiac silhouette is within normal limits. Atherosclerotic calcification of the aorta. No acute osseous pathology. IMPRESSION: Faint bibasilar atelectasis versus infiltrate. No consolidative changes. Electronically Signed   By: Angus Bark M.D.   On: 01/11/2024 17:38    EKG: I independently viewed the EKG done and my findings are as followed: Sinus rhythm rate of 85.  Nonspecific ST-T changes.  QTc 444.  Assessment/Plan Present on Admission:  Severe sepsis (HCC)  Principal Problem:    Severe sepsis (HCC)  Severe sepsis secondary to UTI, POA Lactic acidosis 4.5, leukocytosis 20.2K, UA positive for pyuria, hypotension. Follow urine culture for ID and sensitivities Follow peripheral blood cultures x 2 Continue cefepime, initiated in the ER, for now De-escalate antibiotics as able Continue IV fluid Maintain MAP greater than 65 Midodrine  10 mg 3 times daily  Infrarenal abdominal aortic aneurysm measuring 6.6 cm Seen by vascular surgery outpatient Declined surgical intervention Maintain BP normotensive  AKI on CKD 3B, suspect prerenal in the setting of poor oral intake Presented with BUN 64 and creatinine of 3.84 Baseline creatinine 2.19 with GFR of 31. Avoid nephrotoxic agents, dehydration, and hypotension Monitor urine output Repeat BMP in the morning  Non-anion gap metabolic acidosis in the setting of lactic acidosis Serum bicarb 17, anion gap 11 Lactic acid 4.5 Continue IV fluid and IV antibiotics Repeat BMP  Elevated troponin, suspect demand ischemia in the setting of severe sepsis Troponin 48, repeat 34  Continue to treat underlying condition Closely monitor on telemetry  Symptomatic anemia Anemia of chronic disease in the setting of CKD 3B 1 unit PRBCs ordered to be transfused for hemoglobin of 7.2K Consent for blood transfusion was obtained via phone from the patient's brother Isaac Stokes who is also his medical power of attorney. No reported overt bleeding Monitor H&H  Left lower extremity edema and pain, rule out DVT Follow left lower extremity duplex ultrasound Elevate extremity  Thrombocytopenia Platelet count 95 Monitor repeat CBC  BPH Resume home tamsulosin Closely monitor for acute urinary retention and urine output As needed In-N-Out cath As needed bladder scan  Chronic HFpEF Euvolemic Monitor strict I's and O Daily weight  Tobacco use disorder Current smoker Nicotine  patch  Chronic hypotension On midodrine  10 mg twice  daily prior to admission Closely monitor vital signs, maintain MAP greater than 65  Prior CVA Resume home regimen Fall precautions  Goals of care Patient is DNR/DNI Hospice patient ([facility]).    Critical care time: 65 minutes.    DVT prophylaxis: Subcu heparin 3 times daily  Code Status: DNR/DNI  Family Communication: Updated patient's brother at bedside and via phone.  Disposition Plan: Admitted to progressive care unit.  Consults called: None.  Admission status: Inpatient status.   The patient requires at least 2 midnights for further evaluation and treatment of present condition.   Bary Boss MD [facility] Pager [phone]  If 7PM-7AM, please contact night-coverage [email or web address] Password [email or web address]  01/12/2024, 3:24 AM

## 2024-01-11 NOTE — ED Notes (Addendum)
 RN and NT attempted to change foley, when inserting new foley pts penis started to bleed. Blood is actively coming out of penis. MD notified. Unable to place foley due to resistance.

## 2024-01-11 NOTE — Progress Notes (Signed)
 ED Pharmacy Antibiotic Sign Off An antibiotic consult was received from an ED provider for vancomycin per pharmacy dosing for sepsis. A chart review was completed to assess appropriateness.   The following one time order(s) were placed:  Vancomycin 1250 mg IV x1  Further antibiotic and/or antibiotic pharmacy consults should be ordered by the admitting provider if indicated.   Thank you for involving pharmacy in this patient's care.  Caroline Cinnamon, PharmD, BCPS Clinical Pharmacist Clinical phone for 01/11/2024 is [phone] 01/11/2024 7:31 PM

## 2024-01-11 NOTE — ED Triage Notes (Signed)
 Pt bib [facility] coming from home. EMS reports patient was with hospice nurse and requested to be transported to ED with increasing weakness and hypotension. Pt denies pain/shortness of breath. Pt has hx of AAA, multiple strokes and is on plavix . 12 lead unremarkable with EMS. EMS reports patient is alert and oriented x2. Does not wear O2 but EMS put pt on 2L after pt SpO2 @91 %. Blood noted to catheter bag.   EMS vitals: 64/40, most recent 74/44 98 cbg 98% 2L 500NS

## 2024-01-12 ENCOUNTER — Other Ambulatory Visit: Payer: Self-pay

## 2024-01-12 ENCOUNTER — Inpatient Hospital Stay (HOSPITAL_COMMUNITY)

## 2024-01-12 DIAGNOSIS — M7989 Other specified soft tissue disorders: Secondary | ICD-10-CM

## 2024-01-12 DIAGNOSIS — R6520 Severe sepsis without septic shock: Secondary | ICD-10-CM | POA: Diagnosis not present

## 2024-01-12 DIAGNOSIS — A419 Sepsis, unspecified organism: Secondary | ICD-10-CM

## 2024-01-12 LAB — BLOOD CULTURE ID PANEL (REFLEXED) - BCID2

## 2024-01-12 LAB — URINE CULTURE

## 2024-01-12 LAB — MAGNESIUM: Magnesium: 1.8 mg/dL (ref 1.7–2.4)

## 2024-01-12 LAB — COMPREHENSIVE METABOLIC PANEL WITH GFR
ALT: 19 U/L (ref 0–44)
AST: 38 U/L (ref 15–41)
Albumin: 2.4 g/dL — ABNORMAL LOW (ref 3.5–5.0)
Alkaline Phosphatase: 54 U/L (ref 38–126)
Anion gap: 11 (ref 5–15)
BUN: 64 mg/dL — ABNORMAL HIGH (ref 8–23)
CO2: 15 mmol/L — ABNORMAL LOW (ref 22–32)
Calcium: 7.6 mg/dL — ABNORMAL LOW (ref 8.9–10.3)
Chloride: 110 mmol/L (ref 98–111)
Creatinine, Ser: 3.34 mg/dL — ABNORMAL HIGH (ref 0.61–1.24)
GFR, Estimated: 19 mL/min — ABNORMAL LOW (ref >60)
Glucose, Bld: 61 mg/dL — ABNORMAL LOW (ref 70–99)
Potassium: 5 mmol/L (ref 3.5–5.1)
Sodium: 136 mmol/L (ref 135–145)
Total Bilirubin: 0.9 mg/dL (ref 0.0–1.2)
Total Protein: 5.5 g/dL — ABNORMAL LOW (ref 6.5–8.1)

## 2024-01-12 LAB — CBC
HCT: 24.6 % — ABNORMAL LOW (ref 39.0–52.0)
Hemoglobin: 7.8 g/dL — ABNORMAL LOW (ref 13.0–17.0)
MCH: 31.7 pg (ref 26.0–34.0)
MCHC: 31.7 g/dL (ref 30.0–36.0)
MCV: 100 fL (ref 80.0–100.0)
Platelets: 73 10*3/uL — ABNORMAL LOW (ref 150–400)
RBC: 2.46 MIL/uL — ABNORMAL LOW (ref 4.22–5.81)
RDW: 16.2 % — ABNORMAL HIGH (ref 11.5–15.5)
WBC: 18.5 10*3/uL — ABNORMAL HIGH (ref 4.0–10.5)
nRBC: 0.1 % (ref 0.0–0.2)

## 2024-01-12 LAB — PHOSPHORUS: Phosphorus: 5 mg/dL — ABNORMAL HIGH (ref 2.5–4.6)

## 2024-01-12 LAB — LACTIC ACID, PLASMA
Lactic Acid, Venous: 2.6 mmol/L (ref 0.5–1.9)
Lactic Acid, Venous: 1.7 mmol/L (ref 0.5–1.9)

## 2024-01-12 LAB — PREPARE RBC (CROSSMATCH)

## 2024-01-12 LAB — PROCALCITONIN: Procalcitonin: >150 ng/mL

## 2024-01-12 LAB — BRAIN NATRIURETIC PEPTIDE: B Natriuretic Peptide: 1743.2 pg/mL — ABNORMAL HIGH (ref 0.0–100.0)

## 2024-01-12 MED ORDER — OXYCODONE HCL 5 MG PO TABS
5.0000 mg | ORAL_TABLET | Freq: Four times a day (QID) | ORAL | Status: AC | PRN
  Administered 2024-01-16 (×2): 5 mg via ORAL
  Filled 2024-01-12 (×2): qty 1

## 2024-01-12 MED ORDER — MIDODRINE HCL 5 MG PO TABS: 15.0000 mg | ORAL_TABLET | ORAL | Status: DC

## 2024-01-12 MED ORDER — NICOTINE 7 MG/24HR TD PT24
7.0000 mg | MEDICATED_PATCH | Freq: Every day | TRANSDERMAL | Status: DC
  Administered 2024-01-12 – 2024-01-16 (×2): 7 mg via TRANSDERMAL
  Filled 2024-01-12 (×3): qty 1

## 2024-01-12 MED ORDER — CLOPIDOGREL BISULFATE 75 MG PO TABS: 75.0000 mg | ORAL_TABLET | Freq: Every day | ORAL | Status: DC

## 2024-01-12 MED ORDER — SODIUM CHLORIDE 0.9 % IV SOLN: 2.0000 g | INTRAVENOUS | Status: DC

## 2024-01-12 MED ORDER — TAMSULOSIN HCL 0.4 MG PO CAPS
0.4000 mg | ORAL_CAPSULE | Freq: Every day | ORAL | Status: DC
  Administered 2024-01-12 – 2024-01-18 (×7): 0.4 mg via ORAL
  Filled 2024-01-12 (×7): qty 1

## 2024-01-12 MED ORDER — ALBUMIN HUMAN 25 % IV SOLN: 50.0000 g | INTRAVENOUS | Status: DC

## 2024-01-12 MED ORDER — MIDODRINE HCL 5 MG PO TABS
10.0000 mg | ORAL_TABLET | Freq: Three times a day (TID) | ORAL | Status: AC
  Filled 2024-01-12: qty 2

## 2024-01-12 MED ORDER — HEPARIN SODIUM (PORCINE) 5000 UNIT/ML IJ SOLN
5000.0000 [IU] | Freq: Three times a day (TID) | INTRAMUSCULAR | Status: DC
  Administered 2024-01-12: 5000 [IU] via SUBCUTANEOUS
  Filled 2024-01-12: qty 1

## 2024-01-12 MED ORDER — IPRATROPIUM-ALBUTEROL 0.5-2.5 (3) MG/3ML IN SOLN
3.0000 mL | Freq: Three times a day (TID) | RESPIRATORY_TRACT | Status: DC
  Administered 2024-01-12 – 2024-01-13 (×2): 3 mL via RESPIRATORY_TRACT
  Filled 2024-01-12 (×2): qty 3

## 2024-01-12 MED ORDER — MIDODRINE HCL 5 MG PO TABS
10.0000 mg | ORAL_TABLET | ORAL | Status: AC
  Administered 2024-01-12: 10 mg via ORAL
  Filled 2024-01-12: qty 2

## 2024-01-12 MED ORDER — SODIUM CHLORIDE 0.9% IV SOLUTION: Freq: Once | INTRAVENOUS | Status: DC

## 2024-01-12 MED ORDER — SODIUM CHLORIDE 0.9 % IV SOLN
2.0000 g | INTRAVENOUS | Status: DC
  Administered 2024-01-12 – 2024-01-14 (×3): 2 g via INTRAVENOUS
  Filled 2024-01-12 (×3): qty 20

## 2024-01-12 NOTE — ED Notes (Signed)
 Blood administration at this time will need to retrieve blood after transfusion.

## 2024-01-12 NOTE — Progress Notes (Signed)
 PROGRESS NOTE  Isaac Stokes QIO:[id number] DOB: 02/17/1951 DOA: 01/11/2024 PCP: [facility]   LOS: 1 day   Brief narrative:   Isaac Stokes is a 73 y.o. male with medical history significant for infrarenal abdominal aortic aneurysm, prior CVA, chronic hypotension on midodrine , BPH, chronic HFpEF, current smoker, presented to hospital with generalized weakness decreased oral intake.  Hospice nurse checked on the patient and was noted to be hypotensive so EMS was called in.  Systolic blood pressure was noted to be in the 60s and patient received normal saline bolus and was brought into the hospital.  In the ED code sepsis was activated.  Patient had leukocytosis at 20.2 K with lactic acidosis at 4.5 and urinalysis was positive for pyuria.  Chest x-ray showed possible pneumonia.  Patient received IV cefepime vancomycin and 2 L of normal saline IV fluid bolus and was admitted to hospital for further evaluation and treatment.       Assessment/Plan: Principal Problem:   Severe sepsis (HCC)   Severe sepsis secondary to UTI, POA Follow urine culture and sensitivity including blood cultures.  Continue cefepime. Continue midodrine .  Urine culture and blood culture pending at this time.  Procalcitonin was significantly elevated at more than 150.  WBC elevated 18.5.  Temperature max of 99.3 F.  Blood pressure has improved at this time.  Lactic acid has trended down to 2.6 from 4.5.   Infrarenal abdominal aortic aneurysm measuring 6.6 cm Seen by vascular surgery outpatient. Declined surgical intervention   AKI on CKD 3B, suspect prerenal in the setting of poor oral intake Presented with BUN 64 and creatinine of 3.84. Baseline creatinine 2.19 with GFR of 31.  Creatinine today at 3.3.  Continue monitoring closely.  Received IV fluid boluses initially.  Still on IV fluids.  Will discontinue IV fluids from today.  Encourage oral hydration.  Has significantly elevated BNP.  Non-anion  gap metabolic acidosis in the setting of lactic acidosis Received IV fluids and antibiotics.  Will discontinue IV fluids.  Encourage oral hydration.   Elevated troponin, suspect demand ischemia in the setting of severe sepsis Troponin 48, repeat 34.  No chest pain.   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  Hemoglobin today at 7.8.  Left lower extremity edema and pain, rule out DVT Preliminary left lower extremity duplex ultrasound without DVT.   Thrombocytopenia Platelet count of 73.  Continue to monitor.  Check CBC in AM.   BPH Resume home tamsulosin.   Closely monitor for urinary retention.  Continue intake and output charting.  Chronic HFpEF Euvolemic at this time.  Continue intake and output charting.   Tobacco use disorder Current smoker.  Continue Nicotine  patch   Chronic hypotension On midodrine  10 mg twice daily prior to admission.  Continue midodrine    Prior CVA Resume home regimen Fall precautions   Goals of care Patient is DNR/DNI. Hospice patient ([facility]).  DVT prophylaxis: heparin injection 5,000 Units Start: 01/12/24 0600   Disposition: Back to home hospice when clinically improved likely in 4 to 4 days  Status is: Inpatient Remains inpatient appropriate because: Severe sepsis, antibiotics, follow culture data, pending clinical improvement    Code Status:     Code Status: Limited: Do not attempt resuscitation (DNR) -DNR-LIMITED -Do Not Intubate/DNI   Family Communication: I tried to reach out to the patient's brother but unable to reach despite multiple attempts  Consultants: None  Procedures: None  Anti-infectives:  Cefepime IV  Anti-infectives (From admission, onward)    Start     Dose/Rate Route Frequency Ordered Stop   01/12/24 2200  ceFEPIme (MAXIPIME) 2 g in sodium chloride  0.9 % 100 mL IVPB        2 g 200 mL/hr over 30 Minutes Intravenous Every 24 hours 01/12/24  0203     01/11/24 1945  Vancomycin (VANCOCIN) 1,250 mg in sodium chloride  0.9 % 250 mL IVPB        1,250 mg 166.7 mL/hr over 90 Minutes Intravenous  Once 01/11/24 1930 01/11/24 2103   01/11/24 1730  ceFEPIme (MAXIPIME) 2 g in sodium chloride  0.9 % 100 mL IVPB        2 g 200 mL/hr over 30 Minutes Intravenous  Once 01/11/24 1715 01/11/24 1816        Subjective: Today, patient was seen and examined at bedside.  Poor historian.  Repeating "yes" multiple times, difficult to articulate at times.  Unsure why he is here.  Complains of fatigue weakness and tiredness.  Objective: Vitals:   01/12/24 1015 01/12/24 1101  BP: 102/70   Pulse: 72   Resp:    Temp:  99 F (37.2 C)  SpO2: 95%    No intake or output data in the 24 hours ending 01/12/24 1105 There were no vitals filed for this visit. There is no height or weight on file to calculate BMI.   Physical Exam:  GENERAL: Patient is alert awake and Communicative, oriented to place and person..  Not in obvious distress.  Elderly male, deconditioned and weak. HENT: No scleral pallor or icterus. Pupils equally reactive to light. Oral mucosa is moist NECK: is supple, no gross swelling noted. CHEST: Clear to auscultation. No crackles or wheezes. CVS: S1 and S2 heard, no murmur. Regular rate and rhythm.  ABDOMEN: Soft, non-tender, bowel sounds are present. EXTREMITIES: No edema. CNS: Moves extremities.  Generalized weakness noted. SKIN: warm and dry without rashes.  Data Review: I have personally reviewed the following laboratory data and studies,  CBC: Recent Labs  Lab 01/11/24 1647 01/12/24 0549  WBC 20.2* 18.5*  NEUTROABS 17.9*  --   HGB 7.2* 7.8*  HCT 22.7* 24.6*  MCV 98.3 100.0  PLT 95* 73*   Basic Metabolic Panel: Recent Labs  Lab 01/11/24 1647 01/12/24 0549  NA 135 136  K 5.1 5.0  CL 107 110  CO2 17* 15*  GLUCOSE 80 61*  BUN 64* 64*  CREATININE 3.84* 3.34*  CALCIUM  7.6* 7.6*  MG  --  1.8  PHOS  --  5.0*    Liver Function Tests: Recent Labs  Lab 01/11/24 1647 01/12/24 0549  AST 30 38  ALT 15 19  ALKPHOS 42 54  BILITOT 0.5 0.9  PROT 5.4* 5.5*  ALBUMIN 2.4* 2.4*   No results for input(s): "LIPASE", "AMYLASE" in the last 168 hours. No results for input(s): "AMMONIA" in the last 168 hours. Cardiac Enzymes: No results for input(s): "CKTOTAL", "CKMB", "CKMBINDEX", "TROPONINI" in the last 168 hours. BNP (last 3 results) Recent Labs    01/12/24 0549  BNP 1,743.2*    ProBNP (last 3 results) No results for input(s): "PROBNP" in the last 8760 hours.  CBG: No results for input(s): "GLUCAP" in the last 168 hours. Recent Results (from the past 240 hours)  Blood Culture (routine x 2)     Status: None (Preliminary result)   Collection Time: 01/11/24  4:47 PM   Specimen: BLOOD  Result Value Ref Range Status   Specimen Description BLOOD RIGHT ANTECUBITAL  Final   Special Requests   Final    BOTTLES DRAWN AEROBIC AND ANAEROBIC Blood Culture adequate volume   Culture   Final    NO GROWTH < 24 HOURS Performed at [facility], [address] [place], [place] [address]    Report Status PENDING  Incomplete  Blood Culture (routine x 2)     Status: None (Preliminary result)   Collection Time: 01/11/24  5:32 PM   Specimen: BLOOD LEFT ARM  Result Value Ref Range Status   Specimen Description BLOOD LEFT ARM  Final   Special Requests   Final    BOTTLES DRAWN AEROBIC AND ANAEROBIC Blood Culture adequate volume   Culture   Final    NO GROWTH < 24 HOURS Performed at [facility], [address], [place], [place] [address]    Report Status PENDING  Incomplete  Urine Culture     Status: None (Preliminary result)   Collection Time: 01/11/24  8:38 PM   Specimen: Urine, Catheterized  Result Value Ref Range Status   Specimen Description URINE, CATHETERIZED  Final   Special Requests   Final    NONE Reflexed from [id number] Performed at [facility], [address]  [place], [place] [address]    Culture PENDING  Incomplete   Report Status PENDING  Incomplete     Studies: VAS US  LOWER EXTREMITY VENOUS (DVT) Result Date: 01/12/2024  Lower Venous DVT Study Patient Name:  Isaac Stokes  Date of Exam:   01/12/2024 Medical Rec #: [id number]         Accession #:    [id number] Date of Birth: 1950/09/29         Patient Gender: M Patient Age:   8 years Exam Location:  [facility] Procedure:      VAS US  LOWER EXTREMITY VENOUS (DVT) Referring Phys: Kennie Peach HALL --------------------------------------------------------------------------------  Indications: Swelling, Pain, Edema, and left leg.  Comparison Study: 03/28/21 - Negative Performing Technologist: Franky Ivanoff Sturdivant-Jones RDMS, RVT  Examination Guidelines: A complete evaluation includes B-mode imaging, spectral Doppler, color Doppler, and power Doppler as needed of all accessible portions of each vessel. Bilateral testing is considered an integral part of a complete examination. Limited examinations for reoccurring indications may be performed as noted. The reflux portion of the exam is performed with the patient in reverse Trendelenburg.  +---------+---------------+---------+-----------+----------+--------------+ RIGHT    CompressibilityPhasicitySpontaneityPropertiesThrombus Aging +---------+---------------+---------+-----------+----------+--------------+ CFV      Full           Yes      Yes                                 +---------+---------------+---------+-----------+----------+--------------+ SFJ      Full                                                        +---------+---------------+---------+-----------+----------+--------------+ FV Prox  Full                                                        +---------+---------------+---------+-----------+----------+--------------+  FV Mid   Full                                                         +---------+---------------+---------+-----------+----------+--------------+ FV DistalFull                                                        +---------+---------------+---------+-----------+----------+--------------+ PFV      Full                                                        +---------+---------------+---------+-----------+----------+--------------+ POP      Full           Yes      Yes                                 +---------+---------------+---------+-----------+----------+--------------+ PTV      Full                                                        +---------+---------------+---------+-----------+----------+--------------+ PERO     Full                                                        +---------+---------------+---------+-----------+----------+--------------+   +---------+---------------+---------+-----------+----------+--------------+ LEFT     CompressibilityPhasicitySpontaneityPropertiesThrombus Aging +---------+---------------+---------+-----------+----------+--------------+ CFV      Full           Yes      Yes                                 +---------+---------------+---------+-----------+----------+--------------+ SFJ      Full                                                        +---------+---------------+---------+-----------+----------+--------------+ FV Prox  Full                                                        +---------+---------------+---------+-----------+----------+--------------+ FV Mid   Full                                                        +---------+---------------+---------+-----------+----------+--------------+  FV DistalFull                                                        +---------+---------------+---------+-----------+----------+--------------+ PFV      Full                                                         +---------+---------------+---------+-----------+----------+--------------+ POP      Full           Yes      Yes                                 +---------+---------------+---------+-----------+----------+--------------+ PTV      Full                                                        +---------+---------------+---------+-----------+----------+--------------+ PERO     Full                                                        +---------+---------------+---------+-----------+----------+--------------+     Summary: RIGHT: - There is no evidence of deep vein thrombosis in the lower extremity.  - A cystic structure is found in the popliteal fossa. - Measuring 4.5 x 3.3 x 1.8 cm  LEFT: - There is no evidence of deep vein thrombosis in the lower extremity.  - No cystic structure found in the popliteal fossa.  *See table(s) above for measurements and observations.    Preliminary    DG Chest Port 1 View Result Date: 01/11/2024 CLINICAL DATA:  Sepsis. EXAM: PORTABLE CHEST 1 VIEW COMPARISON:  Chest CT dated 12/12/2022. FINDINGS: Faint bibasilar interstitial densities may represent atelectasis. Atypical infiltrate is not excluded. No consolidative changes. There is no pleural effusion or pneumothorax. The cardiac silhouette is within normal limits. Atherosclerotic calcification of the aorta. No acute osseous pathology. IMPRESSION: Faint bibasilar atelectasis versus infiltrate. No consolidative changes. Electronically Signed   By: Angus Bark M.D.   On: 01/11/2024 17:38      Rosena Conradi, MD  [facility] 01/12/2024  If 7PM-7AM, please contact night-coverage

## 2024-01-12 NOTE — ED Notes (Signed)
Pt has been sleeping all morning.

## 2024-01-12 NOTE — ED Notes (Signed)
 Pt found to be up at bedside. Bed found to be wet with urine as pt had removed condom cath.  Pt very unsteady.  2-assist with standing while bed was cleaned.  Pt placed in brief and gown changed. Pt assisted back to bed.

## 2024-01-12 NOTE — Hospital Course (Addendum)
 Isaac Stokes is a 73 y.o. male with medical history significant for infrarenal abdominal aortic aneurysm, prior CVA, chronic hypotension on midodrine , BPH, chronic HFpEF, current smoker, presented to hospital with generalized weakness decreased oral intake.  Hospice nurse checked on the patient and was noted to be hypotensive so EMS was called in.  Systolic blood pressure was noted to be in the 60s and patient received normal saline bolus and was brought into the hospital.  In the ED code sepsis was activated.  Patient had leukocytosis at 20.2 K with lactic acidosis at 4.5 and urinalysis was positive for pyuria.  Chest x-ray showed possible pneumonia.  Patient received IV cefepime vancomycin and 2 L of normal saline IV fluid bolus and was admitted to hospital for further evaluation and treatment.     Severe sepsis secondary to UTI, POA Follow urine culture and sensitivity including blood cultures.  Continue cefepime. Continue midodrine .  Urine culture and blood culture pending at this time.   Infrarenal abdominal aortic aneurysm measuring 6.6 cm Seen by vascular surgery outpatient. Declined surgical intervention   AKI on CKD 3B, suspect prerenal in the setting of poor oral intake Presented with BUN 64 and creatinine of 3.84. Baseline creatinine 2.19 with GFR of 31.  Continue monitoring closely.  Received IV fluid boluses initially  Non-anion gap metabolic acidosis in the setting of lactic acidosis Received IV fluids and antibiotics.   Elevated troponin, suspect demand ischemia in the setting of severe sepsis Troponin 48, repeat 34.  No chest pain.   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  Hemoglobin today at 7.8.  Left lower extremity edema and pain, rule out DVT Pending left lower extremity duplex ultrasound.   Thrombocytopenia Platelet count of 73.  Continue to monitor.   BPH Resume home tamsulosin.   Closely monitor  for urinary retention.  Continue intake and output charting.  Chronic HFpEF Euvolemic at this time.  Continue intake and output charting.   Tobacco use disorder Current smoker.  Continue Nicotine  patch   Chronic hypotension On midodrine  10 mg twice daily prior to admission.  Continue midodrine    Prior CVA Resume home regimen Fall precautions   Goals of care Patient is DNR/DNI Hospice patient ([facility]).

## 2024-01-12 NOTE — ED Notes (Signed)
 Pt bp began raising after new orders placed. Advised MD Del Favia and som orders discontinued. Will transfuse blood when blood ready.

## 2024-01-12 NOTE — Progress Notes (Signed)
 PHARMACY - PHYSICIAN COMMUNICATION CRITICAL VALUE ALERT - BLOOD CULTURE IDENTIFICATION (BCID)  Isaac Stokes is an 73 y.o. male who presented to [facility] on 01/11/2024 with a chief complaint of weakness  Assessment:  Patient started on cefepime and vancomycin for sepsis.  One set of blood cultures positive for GNR, Ecoli detected on BCID  Name of physician (or Provider) Contacted: Pokhrel  Current antibiotics: Cefepime  Changes to prescribed antibiotics recommended: Change cefepime to ceftriaxone 2g IV q24h Recommendations accepted by provider  Results for orders placed or performed during the hospital encounter of 01/11/24  Blood Culture ID Panel (Reflexed) (Collected: 01/11/2024  4:47 PM)  Result Value Ref Range   Enterococcus faecalis NOT DETECTED NOT DETECTED   Enterococcus Faecium NOT DETECTED NOT DETECTED   Listeria monocytogenes NOT DETECTED NOT DETECTED   Staphylococcus species NOT DETECTED NOT DETECTED   Staphylococcus aureus (BCID) NOT DETECTED NOT DETECTED   Staphylococcus epidermidis NOT DETECTED NOT DETECTED   Staphylococcus lugdunensis NOT DETECTED NOT DETECTED   Streptococcus species NOT DETECTED NOT DETECTED   Streptococcus agalactiae NOT DETECTED NOT DETECTED   Streptococcus pneumoniae NOT DETECTED NOT DETECTED   Streptococcus pyogenes NOT DETECTED NOT DETECTED   A.calcoaceticus-baumannii NOT DETECTED NOT DETECTED   Bacteroides fragilis NOT DETECTED NOT DETECTED   Enterobacterales DETECTED (A) NOT DETECTED   Enterobacter cloacae complex NOT DETECTED NOT DETECTED   Escherichia coli DETECTED (A) NOT DETECTED   Klebsiella aerogenes NOT DETECTED NOT DETECTED   Klebsiella oxytoca NOT DETECTED NOT DETECTED   Klebsiella pneumoniae NOT DETECTED NOT DETECTED   Proteus species NOT DETECTED NOT DETECTED   Salmonella species NOT DETECTED NOT DETECTED   Serratia marcescens NOT DETECTED NOT DETECTED   Haemophilus influenzae NOT DETECTED NOT DETECTED   Neisseria  meningitidis NOT DETECTED NOT DETECTED   Pseudomonas aeruginosa NOT DETECTED NOT DETECTED   Stenotrophomonas maltophilia NOT DETECTED NOT DETECTED   Candida albicans NOT DETECTED NOT DETECTED   Candida auris NOT DETECTED NOT DETECTED   Candida glabrata NOT DETECTED NOT DETECTED   Candida krusei NOT DETECTED NOT DETECTED   Candida parapsilosis NOT DETECTED NOT DETECTED   Candida tropicalis NOT DETECTED NOT DETECTED   Cryptococcus neoformans/gattii NOT DETECTED NOT DETECTED   CTX-M ESBL NOT DETECTED NOT DETECTED   Carbapenem resistance IMP NOT DETECTED NOT DETECTED   Carbapenem resistance KPC NOT DETECTED NOT DETECTED   Carbapenem resistance NDM NOT DETECTED NOT DETECTED   Carbapenem resist OXA 48 LIKE NOT DETECTED NOT DETECTED   Carbapenem resistance VIM NOT DETECTED NOT DETECTED    Sherre Docker 01/12/2024  2:49 PM

## 2024-01-12 NOTE — Progress Notes (Signed)
 Lower ext venous duplex  has been completed. Refer to [facility] under chart review to view preliminary results.   01/12/2024  10:17 AM Tonee Silverstein, Hollace Lund ;

## 2024-01-12 NOTE — ED Notes (Signed)
 Pt bp dropped to 75/50 and has been trending down, advised MD Del Favia and new orders placed.

## 2024-01-12 NOTE — Progress Notes (Addendum)
 1 unit PRBCs ordered to be transfused for hemoglobin of 7.2K in the setting of chronic HFpEF with grade 1 diastolic dysfunction.  The writer called and obtained consent from the patient's medical power of attorney, Mr. Anthonette Kinsman, [phone], who consented to the blood transfusion.  The patient does not have capacity at this time and is unable to make this decision.  No charge note.

## 2024-01-12 NOTE — Progress Notes (Signed)
 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 He has had several days of worsening weakness and altered mental status and decision was made for him to be assessed in the ED. He was subsequently admitted on 5.14 with a diagnosis of Sepsis related to UTI. Per Dr. Tessie Fila with [facility] this is a related hospital admission. Mr. Parekh is now on the inpatient unit, he is alert and is in no apparent distress. Unable to talk with patient's family today. Patient is mainly sleeping and is eating a little. He is currently inpatient appropriate with need for IVAB.  Vital Signs: 99.4/68/15  137/86   spO2 92% room air I&O: not recorded Abnormal labs: BUN 64, Creatinine 3.34, Ca+ 7.6, Phos 5, Albumin 2.4, GFR 19, Lactic 2.6, WBC 18.5, RBC 2.46, Hgb 7.8, Hct 24.6, Platelets 73 Diagnostics:   PORTABLE CHEST 1 VIEW IMPRESSION: Faint bibasilar atelectasis versus infiltrate. No consolidative changes.  Electronically Signed   By: Angus Bark M.D.   On: 01/11/2024 17:38  IV/PRN Meds: NS 2L bolus and 100ml/H continuous, Cefepime 2g IV q24H, Rocephin 2g IV q24H, Vancomycin 1250mg  IV x1 As per Progress Note Dr. Amador Bad Pokhrel 5.16 Severe sepsis secondary to UTI, POA Follow urine culture and sensitivity including blood cultures.  Continue cefepime. Continue midodrine .  Urine culture and blood culture pending at this time.  Procalcitonin was significantly elevated at more than 150.  WBC elevated 18.5.  Temperature max of 99.3 F.  Blood pressure has improved at this time.  Lactic acid has trended down to 2.6 from 4.5.   Infrarenal abdominal aortic aneurysm measuring 6.6 cm Seen by vascular surgery outpatient. Declined surgical intervention   AKI on CKD 3B, suspect prerenal in the setting of poor oral intake Presented with BUN 64 and creatinine of 3.84. Baseline creatinine  2.19 with GFR of 31.  Creatinine today at 3.3.  Continue monitoring closely.  Received IV fluid boluses initially.  Still on IV fluids.  Will discontinue IV fluids from today.  Encourage oral hydration.  Has significantly elevated BNP.   Non-anion gap metabolic acidosis in the setting of lactic acidosis Received IV fluids and antibiotics.  Will discontinue IV fluids.  Encourage oral hydration.   Elevated troponin, suspect demand ischemia in the setting of severe sepsis Troponin 48, repeat 34.  No chest pain.   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  Hemoglobin today at 7.8.   Discharge Planning: Ongoing, patient lives alone so home may not be an option Family Contact: unable to reach patient's brother IDT: Updated Goals of Care: DNR Dwane Gitelman, BSN RN Hospice hospital liaison [phone]

## 2024-01-13 ENCOUNTER — Inpatient Hospital Stay (HOSPITAL_COMMUNITY)

## 2024-01-13 DIAGNOSIS — R6520 Severe sepsis without septic shock: Secondary | ICD-10-CM | POA: Diagnosis not present

## 2024-01-13 DIAGNOSIS — A419 Sepsis, unspecified organism: Secondary | ICD-10-CM | POA: Diagnosis not present

## 2024-01-13 LAB — BASIC METABOLIC PANEL WITH GFR
Anion gap: 8 (ref 5–15)
BUN: 64 mg/dL — ABNORMAL HIGH (ref 8–23)
CO2: 16 mmol/L — ABNORMAL LOW (ref 22–32)
Calcium: 7.9 mg/dL — ABNORMAL LOW (ref 8.9–10.3)
Chloride: 113 mmol/L — ABNORMAL HIGH (ref 98–111)
Creatinine, Ser: 2.85 mg/dL — ABNORMAL HIGH (ref 0.61–1.24)
GFR, Estimated: 22 mL/min — ABNORMAL LOW (ref >60)
Glucose, Bld: 63 mg/dL — ABNORMAL LOW (ref 70–99)
Potassium: 4.3 mmol/L (ref 3.5–5.1)
Sodium: 137 mmol/L (ref 135–145)

## 2024-01-13 LAB — CBC WITH DIFFERENTIAL/PLATELET
Abs Immature Granulocytes: 0 10*3/uL (ref 0.00–0.07)
Basophils Absolute: 0.2 10*3/uL — ABNORMAL HIGH (ref 0.0–0.1)
Basophils Relative: 1 %
Eosinophils Absolute: 0.2 10*3/uL (ref 0.0–0.5)
Eosinophils Relative: 1 %
HCT: 24.1 % — ABNORMAL LOW (ref 39.0–52.0)
Hemoglobin: 8.2 g/dL — ABNORMAL LOW (ref 13.0–17.0)
Lymphocytes Relative: 3 %
Lymphs Abs: 0.5 10*3/uL — ABNORMAL LOW (ref 0.7–4.0)
MCH: 31.3 pg (ref 26.0–34.0)
MCHC: 34 g/dL (ref 30.0–36.0)
MCV: 92 fL (ref 80.0–100.0)
Monocytes Absolute: 0.3 10*3/uL (ref 0.1–1.0)
Monocytes Relative: 2 %
Neutro Abs: 15.7 10*3/uL — ABNORMAL HIGH (ref 1.7–7.7)
Neutrophils Relative %: 93 %
Platelets: 103 10*3/uL — ABNORMAL LOW (ref 150–400)
RBC: 2.62 MIL/uL — ABNORMAL LOW (ref 4.22–5.81)
RDW: 16.4 % — ABNORMAL HIGH (ref 11.5–15.5)
WBC: 16.9 10*3/uL — ABNORMAL HIGH (ref 4.0–10.5)
nRBC: 0 % (ref 0.0–0.2)
nRBC: 0 /100{WBCs}

## 2024-01-13 LAB — TYPE AND SCREEN
ABO/RH(D): O POS
Antibody Screen: NEGATIVE
Unit division: 0

## 2024-01-13 LAB — BPAM RBC
Blood Product Expiration Date: 202506052359
ISSUE DATE / TIME: 202505160216
Unit Type and Rh: 5100

## 2024-01-13 LAB — MAGNESIUM: Magnesium: 1.9 mg/dL (ref 1.7–2.4)

## 2024-01-13 MED ORDER — MIDODRINE HCL 5 MG PO TABS
10.0000 mg | ORAL_TABLET | Freq: Three times a day (TID) | ORAL | Status: DC
  Administered 2024-01-13 – 2024-01-14 (×4): 10 mg via ORAL
  Filled 2024-01-13 (×4): qty 2

## 2024-01-13 MED ORDER — IPRATROPIUM-ALBUTEROL 0.5-2.5 (3) MG/3ML IN SOLN: 3.0000 mL | Freq: Four times a day (QID) | RESPIRATORY_TRACT | Status: DC | PRN

## 2024-01-13 MED ORDER — HEPARIN SODIUM (PORCINE) 5000 UNIT/ML IJ SOLN
5000.0000 [IU] | Freq: Three times a day (TID) | INTRAMUSCULAR | Status: DC
  Administered 2024-01-13 – 2024-01-18 (×16): 5000 [IU] via SUBCUTANEOUS
  Filled 2024-01-13 (×16): qty 1

## 2024-01-13 MED ORDER — PANTOPRAZOLE SODIUM 40 MG PO TBEC
40.0000 mg | DELAYED_RELEASE_TABLET | Freq: Every day | ORAL | Status: DC
  Administered 2024-01-13 – 2024-01-18 (×6): 40 mg via ORAL
  Filled 2024-01-13 (×6): qty 1

## 2024-01-13 MED ORDER — ESCITALOPRAM OXALATE 10 MG PO TABS
5.0000 mg | ORAL_TABLET | Freq: Every day | ORAL | Status: DC
  Administered 2024-01-13 – 2024-01-18 (×6): 5 mg via ORAL
  Filled 2024-01-13 (×6): qty 1

## 2024-01-13 MED ORDER — LORAZEPAM 0.5 MG PO TABS: 0.5000 mg | ORAL_TABLET | Freq: Two times a day (BID) | ORAL | Status: DC | PRN

## 2024-01-13 MED ORDER — BISACODYL 10 MG RE SUPP: 10.0000 mg | RECTAL | Status: DC | PRN

## 2024-01-13 NOTE — Plan of Care (Signed)
 Pt has rested quietly throughout the night with no distress noted. Alert and oriented to person and place. On room air. SR on the monitor. Purewick intact to suction. No complaints voiced.     Problem: Clinical Measurements: Goal: Respiratory complications will improve Outcome: Progressing Goal: Cardiovascular complication will be avoided Outcome: Progressing   Problem: Coping: Goal: Level of anxiety will decrease Outcome: Progressing   Problem: Pain Managment: Goal: General experience of comfort will improve and/or be controlled Outcome: Progressing

## 2024-01-13 NOTE — Progress Notes (Signed)
 Informed by pharmacist that Plavix  was ordered last night on admission but med rec is now complete and it appears patient is currently not taking Plavix  at home.  Pharmacist has verified outpatient dispense records.  I have reviewed the patient's chart and it seems he was on Plavix  due to history of prior CVA but during last admission in January 2024, Plavix  was discontinued due to recurrent falls and intracranial hemorrhage.  Also given worsening thrombocytopenia on admission labs yesterday, I have held Plavix  for now.  Day team can decide whether Plavix  needs to be continued based on repeat labs and assessment.  Consider speaking to neurology in the morning.

## 2024-01-13 NOTE — Progress Notes (Signed)
 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 He has had several days of worsening weakness and altered mental status and decision was made for him to be assessed in the ED. He was subsequently admitted on 5.14 with a diagnosis of Sepsis related to UTI. Per Dr. Tessie Fila with [facility] this is a related hospital admission.  Upon visit today at bedside, patient alert and sitting in recliner. No distress noted. States that he has been having difficulty with sleeping and at home he takes medication when he needs it for sleep. Liaison informed patient that he does have medication that is ordered as needed at night for sleep. Bedside nurse notified of patient concern and stated she would notify night shift. Patient states overall he feels a bit better. States that provider discussed going to a facility for a while after discharge and he is unsure how he feels about this at this time. Patient brother called while I was present.  Patient remains inpatient appropriate due to need for IVAB.   Vital Signs: 97.7/78/19  140/86   SpO2 96% room air  I&O: not recorded/150  Abnormal labs:  01/13/24 04:32 BASIC METABOLIC PANEL WITH GFR: Rpt ! Chloride: 113 (H) CO2: 16 (L) Glucose: 63 (L) BUN: 64 (H) Creatinine: 2.85 (H) Calcium : 7.9 (L) GFR, Estimated: 22 (L) WBC: 16.9 (H) RBC: 2.62 (L) Hemoglobin: 8.2 (L) HCT: 24.1 (L) RDW: 16.4 (H) Platelets: 103 (L) NEUT#: 15.7 (H) Lymphs Abs: 0.5 (L) Basophils Absolute: 0.2 (H)  Diagnostics:   Narrative & Impression CLINICAL DATA:  73 year old male with urinary tract infection.   EXAM: RENAL / URINARY TRACT ULTRASOUND COMPLETE   COMPARISON:  Noncontrast CT Abdomen and Pelvis 12/12/2022.  IMPRESSION: 1. Evidence of chronic kidney disease with no acute finding. 2. Known severe abdominal aortic aneurysm, see details and  follow-up recommendation on 12/12/2022 CT.  Electronically Signed   By: Marlise Simpers M.D.   On: 01/13/2024 11:31   IV/PRN Meds:  cefTRIAXone  (ROCEPHIN ) 2 g in sodium chloride  0.9 % 100 mL IVPB x1  As per Progress Note Cala Castleman, MD 5.17 Severe sepsis secondary to UTI, POA E. coli bacteremia. Follow urine culture and sensitivity including blood cultures.  History of BPH on Flomax , check renal ultrasound to rule out obstruction, with empiric IV Rocephin  improving, continue to monitor, sepsis pathophysiology much improved.   Infrarenal abdominal aortic aneurysm measuring 6.6 cm Seen by vascular surgery outpatient. Declined surgical intervention, with home hospice.  Discussed with brother, DNR, continue medical treatment.  No heroics.   AKI on CKD 3B, suspect prerenal in the setting of poor oral intake Presented with BUN 64 and creatinine of 3.84. Baseline creatinine 2.19 with GFR of 31.  Proved after hydration and packed RBC transfusion monitor..   Non-anion gap metabolic acidosis in the setting of lactic acidosis Received IV fluids and antibiotics.  Will discontinue IV fluids.  Encourage oral hydration.   Elevated troponin, suspect demand ischemia in the setting of severe sepsis Troponin 48, repeat 34, non-ACS pattern, likely due to mild demand mismatch from symptomatic anemia.  No chest pain.  No further workup.   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  Hemoglobin today at 7.8.  Continue to monitor no signs of ongoing bleeding.   Left lower extremity edema and pain, rule out DVT Supportive care  much improved, ultrasound negative for DVT.   Thrombocytopenia Platelet count of 73.  Continue to monitor.  Check CBC in AM.   BPH Resume home tamsulosin .   Closely monitor for urinary retention.  Continue intake and output charting.   Chronic HFpEF Euvolemic at this time.  Continue intake and output charting.    Tobacco use disorder Current smoker.  Continue Nicotine  patch   Chronic hypotension On midodrine  10 mg prior to admission.  Continue midodrine    Prior CVA Resume home regimen Fall precautions   Discharge Planning: Ongoing, patient lives alone so home may not be an option. May be open to SNF with palliative care   Family Contact: Spoke with patient brother Russ Course via phone. No questions or concerns.  IDT: Updated  Goals of Care: DNR  Should patient need ambulance transfer at discharge- please use [facility]) as they contract this service for our active hospice patients.   Jacqlyn Matas, BSN RN [facility] Liaison [phone]

## 2024-01-13 NOTE — Progress Notes (Signed)
 BBS essn clear. Pt denies need for neb and complains of smell of neb. Change duoneb to PRN per RT protocol assessment.

## 2024-01-13 NOTE — Progress Notes (Signed)
 PROGRESS NOTE                                                                                                                                                                                                             Patient Demographics:    Isaac Stokes, is a 73 y.o. male, DOB - [birth year], ZOX:[id number]  Outpatient Primary MD for the patient is [facility]    LOS - 2  Admit date - 01/11/2024    Chief Complaint  Patient presents with   Hypotension   Weakness       Brief Narrative (HPI from H&P)   73 y.o. male with medical history significant for infrarenal abdominal aortic aneurysm, prior CVA, chronic hypotension on midodrine , BPH, chronic HFpEF, current smoker, presented to hospital with generalized weakness decreased oral intake.  Hospice nurse checked on the patient and was noted to be hypotensive so EMS was called in.  Systolic blood pressure was noted to be in the 60s and patient received normal saline bolus and was brought into the hospital.  In the ED code sepsis was activated.  He was found to have E. coli bacteremia with UTI, urine likely being the source.   Subjective:    [name] today has, No headache, No chest pain, No abdominal pain - No Nausea, No new weakness tingling or numbness, no SOB   Assessment  & Plan :    Severe sepsis secondary to UTI, POA E. coli bacteremia. Follow urine culture and sensitivity including blood cultures.  History of BPH on Flomax , check renal ultrasound to rule out obstruction, with empiric IV Rocephin  improving, continue to monitor, sepsis pathophysiology much improved.   Infrarenal abdominal aortic aneurysm measuring 6.6 cm Seen by vascular surgery outpatient. Declined surgical intervention, with home hospice.  Discussed with brother, DNR, continue medical treatment.  No heroics.   AKI on CKD 3B, suspect prerenal in the setting of poor oral  intake Presented with BUN 64 and creatinine of 3.84. Baseline creatinine 2.19 with GFR of 31.  Proved after hydration and packed RBC transfusion monitor..   Non-anion gap metabolic acidosis in the setting of lactic acidosis Received IV fluids and antibiotics.  Will discontinue IV fluids.  Encourage oral hydration.   Elevated troponin, suspect demand ischemia in the setting of severe sepsis Troponin 48, repeat 34, non-ACS  pattern, likely due to mild demand mismatch from symptomatic anemia.  No chest pain.  No further workup.   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  Hemoglobin today at 7.8.  Continue to monitor no signs of ongoing bleeding.   Left lower extremity edema and pain, rule out DVT Supportive care much improved, ultrasound negative for DVT.   Thrombocytopenia Platelet count of 73.  Continue to monitor.  Check CBC in AM.   BPH Resume home tamsulosin .   Closely monitor for urinary retention.  Continue intake and output charting.   Chronic HFpEF Euvolemic at this time.  Continue intake and output charting.   Tobacco use disorder Current smoker.  Continue Nicotine  patch   Chronic hypotension On midodrine  10 mg prior to admission.  Continue midodrine    Prior CVA Resume home regimen Fall precautions        Condition - Extremely Guarded  Family Communication  : Discussed with brother [phone]  over the phone on 05/15/2024, DNR, continue gentle medical treatment, may be open to SNF with palliative care  Code Status :  DNR  Consults  :  None  PUD Prophylaxis : PPI   Procedures  :     Renal US   Bilateral lower extremity venous duplex.  No DVT.      Disposition Plan  :    Status is: Inpatient  DVT Prophylaxis  :  Heparin     Lab Results  Component Value Date   PLT 103 (L) 01/13/2024    Diet :  Diet Order             Diet Heart Fluid consistency: Thin  Diet effective now                     Inpatient Medications  Scheduled Meds:  sodium chloride    Intravenous Once   ipratropium-albuterol   3 mL Nebulization TID   nicotine   7 mg Transdermal Daily   tamsulosin   0.4 mg Oral Daily   Continuous Infusions:  cefTRIAXone  (ROCEPHIN )  IV 2 g (01/12/24 1721)   PRN Meds:.acetaminophen , melatonin, oxyCODONE , polyethylene glycol, prochlorperazine     Objective:   Vitals:   01/13/24 0400 01/13/24 0418 01/13/24 0820 01/13/24 0848  BP:    121/86  Pulse:   75 74  Resp: 16  15 19   Temp:    97.6 F (36.4 C)  TempSrc:    Oral  SpO2:   94% 94%  Weight:  68.6 kg      Wt Readings from Last 3 Encounters:  01/13/24 68.6 kg  11/06/23 63 kg  12/12/22 63 kg     Intake/Output Summary (Last 24 hours) at 01/13/2024 0981 Last data filed at 01/13/2024 0600 Gross per 24 hour  Intake 120 ml  Output 300 ml  Net -180 ml     Physical Exam  Awake mildly confused, No new F.N deficits, Normal affect [place].AT,PERRAL Supple Neck, No JVD,   Symmetrical Chest wall movement, Good air movement bilaterally, CTAB RRR,No Gallops,Rubs or new Murmurs,  +ve B.Sounds, Abd Soft, No tenderness,   No Cyanosis, Clubbing or edema       Data Review:    Recent Labs  Lab 01/11/24 1647 01/12/24 0549 01/13/24 0432  WBC 20.2* 18.5* 16.9*  HGB 7.2* 7.8* 8.2*  HCT 22.7* 24.6* 24.1*  PLT 95* 73* 103*  MCV 98.3 100.0 92.0  MCH 31.2 31.7 31.3  MCHC 31.7 31.7 34.0  RDW 16.0* 16.2* 16.4*  LYMPHSABS 0.9  --  0.5*  MONOABS 0.8  --  0.3  EOSABS 0.0  --  0.2  BASOSABS 0.1  --  0.2*    Recent Labs  Lab 01/11/24 1647 01/11/24 1710 01/11/24 1851 01/12/24 0549 01/12/24 1507 01/13/24 0432  NA 135  --   --  136  --  137  K 5.1  --   --  5.0  --  4.3  CL 107  --   --  110  --  113*  CO2 17*  --   --  15*  --  16*  ANIONGAP 11  --   --  11  --  8  GLUCOSE 80  --   --  61*  --  63*  BUN 64*  --   --  64*  --  64*  CREATININE 3.84*  --   --  3.34*  --  2.85*  AST 30  --   --  38  --    --   ALT 15  --   --  19  --   --   ALKPHOS 42  --   --  54  --   --   BILITOT 0.5  --   --  0.9  --   --   ALBUMIN  2.4*  --   --  2.4*  --   --   PROCALCITON  --   --   --  >150.00  --   --   LATICACIDVEN  --  3.3* 4.5* 2.6* 1.7  --   INR 1.5*  --   --   --   --   --   BNP  --   --   --  1,610.9*  --   --   MG  --   --   --  1.8  --  1.9  PHOS  --   --   --  5.0*  --   --   CALCIUM  7.6*  --   --  7.6*  --  7.9*      Recent Labs  Lab 01/11/24 1647 01/11/24 1710 01/11/24 1851 01/12/24 0549 01/12/24 1507 01/13/24 0432  PROCALCITON  --   --   --  >150.00  --   --   LATICACIDVEN  --  3.3* 4.5* 2.6* 1.7  --   INR 1.5*  --   --   --   --   --   BNP  --   --   --  6,045.4*  --   --   MG  --   --   --  1.8  --  1.9  CALCIUM  7.6*  --   --  7.6*  --  7.9*    --------------------------------------------------------------------------------------------------------------- Lab Results  Component Value Date   CHOL 234 (H) 01/27/2021   HDL 40 (L) 01/27/2021   LDLCALC 166 (H) 01/27/2021   TRIG 140 01/27/2021   CHOLHDL 5.9 01/27/2021    Lab Results  Component Value Date   HGBA1C 5.5 01/27/2021   No results for input(s): "TSH", "T4TOTAL", "FREET4", "T3FREE", "THYROIDAB" in the last 72 hours. No results for input(s): "VITAMINB12", "FOLATE", "FERRITIN", "TIBC", "IRON", "RETICCTPCT" in the last 72 hours. ------------------------------------------------------------------------------------------------------------------ Cardiac Enzymes No results for input(s): "CKMB", "TROPONINI", "MYOGLOBIN" in the last 168 hours.  Invalid input(s): "CK"  Micro Results Recent Results (from the past 240 hours)  Blood Culture (routine x 2)     Status: Abnormal (Preliminary  result)   Collection Time: 01/11/24  4:47 PM   Specimen: BLOOD  Result Value Ref Range Status   Specimen Description BLOOD RIGHT ANTECUBITAL  Final   Special Requests   Final    BOTTLES DRAWN AEROBIC AND ANAEROBIC Blood Culture  adequate volume   Culture  Setup Time   Final    GRAM NEGATIVE RODS IN BOTH AEROBIC AND ANAEROBIC BOTTLES CRITICAL RESULT CALLED TO, READ BACK BY AND VERIFIED WITH: PHARMD JEREMY FRENS ON 01/12/24 @ 1447 BY DRT    Culture (A)  Final    ESCHERICHIA COLI SUSCEPTIBILITIES TO FOLLOW Performed at [facility], [address] [place], [place] [address]    Report Status PENDING  Incomplete  Blood Culture ID Panel (Reflexed)     Status: Abnormal   Collection Time: 01/11/24  4:47 PM  Result Value Ref Range Status   Enterococcus faecalis NOT DETECTED NOT DETECTED Final   Enterococcus Faecium NOT DETECTED NOT DETECTED Final   Listeria monocytogenes NOT DETECTED NOT DETECTED Final   Staphylococcus species NOT DETECTED NOT DETECTED Final   Staphylococcus aureus (BCID) NOT DETECTED NOT DETECTED Final   Staphylococcus epidermidis NOT DETECTED NOT DETECTED Final   Staphylococcus lugdunensis NOT DETECTED NOT DETECTED Final   Streptococcus species NOT DETECTED NOT DETECTED Final   Streptococcus agalactiae NOT DETECTED NOT DETECTED Final   Streptococcus pneumoniae NOT DETECTED NOT DETECTED Final   Streptococcus pyogenes NOT DETECTED NOT DETECTED Final   A.calcoaceticus-baumannii NOT DETECTED NOT DETECTED Final   Bacteroides fragilis NOT DETECTED NOT DETECTED Final   Enterobacterales DETECTED (A) NOT DETECTED Final    Comment: Enterobacterales represent a large order of gram negative bacteria, not a single organism. CRITICAL RESULT CALLED TO, READ BACK BY AND VERIFIED WITH: PHARMD JEREMY FRENS ON 01/12/24 @ 1447 BY DRT    Enterobacter cloacae complex NOT DETECTED NOT DETECTED Final   Escherichia coli DETECTED (A) NOT DETECTED Final    Comment: CRITICAL RESULT CALLED TO, READ BACK BY AND VERIFIED WITH: PHARMD JEREMY FRENS ON 01/12/24 @ 1447 BY DRT    Klebsiella aerogenes NOT DETECTED NOT DETECTED Final   Klebsiella oxytoca NOT DETECTED NOT DETECTED Final   Klebsiella pneumoniae NOT DETECTED  NOT DETECTED Final   Proteus species NOT DETECTED NOT DETECTED Final   Salmonella species NOT DETECTED NOT DETECTED Final   Serratia marcescens NOT DETECTED NOT DETECTED Final   Haemophilus influenzae NOT DETECTED NOT DETECTED Final   Neisseria meningitidis NOT DETECTED NOT DETECTED Final   Pseudomonas aeruginosa NOT DETECTED NOT DETECTED Final   Stenotrophomonas maltophilia NOT DETECTED NOT DETECTED Final   Candida albicans NOT DETECTED NOT DETECTED Final   Candida auris NOT DETECTED NOT DETECTED Final   Candida glabrata NOT DETECTED NOT DETECTED Final   Candida krusei NOT DETECTED NOT DETECTED Final   Candida parapsilosis NOT DETECTED NOT DETECTED Final   Candida tropicalis NOT DETECTED NOT DETECTED Final   Cryptococcus neoformans/gattii NOT DETECTED NOT DETECTED Final   CTX-M ESBL NOT DETECTED NOT DETECTED Final   Carbapenem resistance IMP NOT DETECTED NOT DETECTED Final   Carbapenem resistance KPC NOT DETECTED NOT DETECTED Final   Carbapenem resistance NDM NOT DETECTED NOT DETECTED Final   Carbapenem resist OXA 48 LIKE NOT DETECTED NOT DETECTED Final   Carbapenem resistance VIM NOT DETECTED NOT DETECTED Final    Comment: Performed at [facility], [address], [place], [place] [address]  Blood Culture (routine x 2)     Status: None (Preliminary  result)   Collection Time: 01/11/24  5:32 PM   Specimen: BLOOD LEFT ARM  Result Value Ref Range Status   Specimen Description BLOOD LEFT ARM  Final   Special Requests   Final    BOTTLES DRAWN AEROBIC AND ANAEROBIC Blood Culture adequate volume   Culture   Final    NO GROWTH 2 DAYS Performed at [facility], [address] [place], [place] [address]    Report Status PENDING  Incomplete  Urine Culture     Status: Abnormal   Collection Time: 01/11/24  8:38 PM   Specimen: Urine, Catheterized  Result Value Ref Range Status   Specimen Description URINE, CATHETERIZED  Final   Special Requests NONE Reflexed from [id number]  Final    Culture (A)  Final    <10,000 COLONIES/mL INSIGNIFICANT GROWTH Performed at [facility], [address] [place], [place] [address]    Report Status 01/12/2024 FINAL  Final    Radiology Report VAS US  LOWER EXTREMITY VENOUS (DVT) Result Date: 01/12/2024  Lower Venous DVT Study Patient Name:  SAVEON PLANT  Date of Exam:   01/12/2024 Medical Rec #: [id number]         Accession #:    [id number] Date of Birth: Jan 24, 1951         Patient Gender: M Patient Age:   14 years Exam Location:  [facility] Procedure:      VAS US  LOWER EXTREMITY VENOUS (DVT) Referring Phys: Kennie Peach HALL --------------------------------------------------------------------------------  Indications: Swelling, Pain, Edema, and left leg.  Comparison Study: 03/28/21 - Negative Performing Technologist: Franky Ivanoff Sturdivant-Jones RDMS, RVT  Examination Guidelines: A complete evaluation includes B-mode imaging, spectral Doppler, color Doppler, and power Doppler as needed of all accessible portions of each vessel. Bilateral testing is considered an integral part of a complete examination. Limited examinations for reoccurring indications may be performed as noted. The reflux portion of the exam is performed with the patient in reverse Trendelenburg.  +---------+---------------+---------+-----------+----------+--------------+ RIGHT    CompressibilityPhasicitySpontaneityPropertiesThrombus Aging +---------+---------------+---------+-----------+----------+--------------+ CFV      Full           Yes      Yes                                 +---------+---------------+---------+-----------+----------+--------------+ SFJ      Full                                                        +---------+---------------+---------+-----------+----------+--------------+ FV Prox  Full                                                        +---------+---------------+---------+-----------+----------+--------------+ FV Mid   Full                                                         +---------+---------------+---------+-----------+----------+--------------+ FV DistalFull                                                        +---------+---------------+---------+-----------+----------+--------------+  PFV      Full                                                        +---------+---------------+---------+-----------+----------+--------------+ POP      Full           Yes      Yes                                 +---------+---------------+---------+-----------+----------+--------------+ PTV      Full                                                        +---------+---------------+---------+-----------+----------+--------------+ PERO     Full                                                        +---------+---------------+---------+-----------+----------+--------------+   +---------+---------------+---------+-----------+----------+--------------+ LEFT     CompressibilityPhasicitySpontaneityPropertiesThrombus Aging +---------+---------------+---------+-----------+----------+--------------+ CFV      Full           Yes      Yes                                 +---------+---------------+---------+-----------+----------+--------------+ SFJ      Full                                                        +---------+---------------+---------+-----------+----------+--------------+ FV Prox  Full                                                        +---------+---------------+---------+-----------+----------+--------------+ FV Mid   Full                                                        +---------+---------------+---------+-----------+----------+--------------+ FV DistalFull                                                        +---------+---------------+---------+-----------+----------+--------------+ PFV      Full                                                         +---------+---------------+---------+-----------+----------+--------------+  POP      Full           Yes      Yes                                 +---------+---------------+---------+-----------+----------+--------------+ PTV      Full                                                        +---------+---------------+---------+-----------+----------+--------------+ PERO     Full                                                        +---------+---------------+---------+-----------+----------+--------------+     Summary: RIGHT: - There is no evidence of deep vein thrombosis in the lower extremity.  - A cystic structure is found in the popliteal fossa. - Measuring 4.5 x 3.3 x 1.8 cm  LEFT: - There is no evidence of deep vein thrombosis in the lower extremity.  - No cystic structure found in the popliteal fossa.  *See table(s) above for measurements and observations.    Preliminary    DG Chest Port 1 View Result Date: 01/11/2024 CLINICAL DATA:  Sepsis. EXAM: PORTABLE CHEST 1 VIEW COMPARISON:  Chest CT dated 12/12/2022. FINDINGS: Faint bibasilar interstitial densities may represent atelectasis. Atypical infiltrate is not excluded. No consolidative changes. There is no pleural effusion or pneumothorax. The cardiac silhouette is within normal limits. Atherosclerotic calcification of the aorta. No acute osseous pathology. IMPRESSION: Faint bibasilar atelectasis versus infiltrate. No consolidative changes. Electronically Signed   By: Angus Bark M.D.   On: 01/11/2024 17:38     Signature  -   Lynnwood Sauer M.D on 01/13/2024 at 9:22 AM   -  To page go to [email or web address]

## 2024-01-13 NOTE — Plan of Care (Signed)

## 2024-01-13 NOTE — Progress Notes (Signed)
 PT Cancellation Note  Patient Details Name: Isaac Stokes MRN: [id number] DOB: 1950-09-18   Cancelled Treatment:    Reason Eval/Treat Not Completed: Patient at procedure or test/unavailable  Currently off unit for testing. Will follow-up later as schedule permits.  Jory Ng, PT, DPT [facility]  Rehabilitation Services Physical Therapist Office: [phone] Website: [email or web address]  Alinda Irani 01/13/2024, 10:56 AM

## 2024-01-13 NOTE — Evaluation (Signed)
 Physical Therapy Evaluation Patient Details Name: Isaac Stokes MRN: [id number] DOB: 04-May-1951 Today's Date: 01/13/2024  History of Present Illness  73 y.o. male presented to hospital 5/15 with generalized weakness decreased oral intake, hypotensive, found to have severe sepsis secondary to UTI. PMH: infrarenal abdominal aortic aneurysm, prior CVA, chronic hypotension on midodrine , BPH, chronic HFpEF, current smoker.  Clinical Impression  Pt admitted with above diagnosis. Mod I prior to admission with hospice following, visits 1x/week, brother and nephew check in on patient. He was ambulating short distances with RW until recently, just started using w/c more often throughout apartment. History a little questionable. He is oriented to place, self, location, month and year, but unsure of why he has been hospitalized, and is perseverating a bit on the purewick. He has obvious memory deficits currently. Required min assist for bed mobility, and transfers. Shows posterior loss of balance while standing and turning. Patient will benefit from continued inpatient follow up therapy, <3 hours/day. Will progress as tolerated. He hopes to be able to take care of himself before returning home, and is open to short term rehab if that is an option. Pt currently with functional limitations due to the deficits listed below (see PT Problem List). Pt will benefit from acute skilled PT to increase their independence and safety with mobility to allow discharge.           If plan is discharge home, recommend the following: A little help with walking and/or transfers;A little help with bathing/dressing/bathroom;Supervision due to cognitive status;Assist for transportation;Assistance with cooking/housework   Can travel by private vehicle   No (likely soon)    Equipment Recommendations None recommended by PT  Recommendations for Other Services       Functional Status Assessment Patient has had a recent decline in  their functional status and demonstrates the ability to make significant improvements in function in a reasonable and predictable amount of time.     Precautions / Restrictions Precautions Precautions: Fall Recall of Precautions/Restrictions: Impaired Restrictions Weight Bearing Restrictions Per Provider Order: No      Mobility  Bed Mobility Overal bed mobility: Needs Assistance Bed Mobility: Supine to Sit     Supine to sit: Min assist     General bed mobility comments: min assist to scoot to EOB. Cues for technique.    Transfers Overall transfer level: Needs assistance Equipment used: 1 person hand held assist Transfers: Sit to/from Stand, Bed to chair/wheelchair/BSC Sit to Stand: Min assist   Step pivot transfers: Min assist       General transfer comment: min assist for sit to stand transition to boost and balance, holds therapist's hand for support. Min assist to correct posterior instability with step pivot to recliner.    Ambulation/Gait                  Stairs            Wheelchair Mobility     Tilt Bed    Modified Rankin (Stroke Patients Only)       Balance Overall balance assessment: Needs assistance Sitting-balance support: No upper extremity supported, Feet supported Sitting balance-Leahy Scale: Good     Standing balance support: Single extremity supported, During functional activity Standing balance-Leahy Scale: Poor                               Pertinent Vitals/Pain Pain Assessment Pain Assessment: No/denies pain    Home  Living Family/patient expects to be discharged to:: Private residence (hospice visits 1x/week per pt) Living Arrangements: Alone Available Help at Discharge: Family;Available PRN/intermittently Type of Home: Apartment Home Access: Elevator       Home Layout:  (Lives 3rd floor of apt with an elevator) Home Equipment: Rolling Walker (2 wheels);BSC/3in1;Tub bench;Wheelchair -  manual;Hospital bed;Cane - single point      Prior Function Prior Level of Function : Independent/Modified Independent;Patient poor historian/Family not available             Mobility Comments: States mod I, walks short distance with RW, more recently has been transferring and using w/c to mobilize through apartment. ADLs Comments: States Mod I, , has meals delivered.     Extremity/Trunk Assessment   Upper Extremity Assessment Upper Extremity Assessment: Defer to OT evaluation    Lower Extremity Assessment Lower Extremity Assessment: Generalized weakness       Communication   Communication Communication: No apparent difficulties    Cognition Arousal: Alert Behavior During Therapy: WFL for tasks assessed/performed   PT - Cognitive impairments: No family/caregiver present to determine baseline, Orientation, Memory, Awareness, Safety/Judgement, Problem solving   Orientation impairments: Situation                   PT - Cognition Comments: Oriented on place, month, year. Unsure of why he is here. Perseverating on his purewick which he had removed (NT notified after I reapplied.) Following commands: Impaired Following commands impaired: Follows one step commands inconsistently, Follows one step commands with increased time     Cueing Cueing Techniques: Verbal cues, Gestural cues, Tactile cues     General Comments General comments (skin integrity, edema, etc.): VSS    Exercises     Assessment/Plan    PT Assessment Patient needs continued PT services  PT Problem List Decreased strength;Decreased activity tolerance;Decreased mobility;Decreased balance;Decreased coordination;Decreased cognition;Decreased knowledge of use of DME;Decreased safety awareness;Decreased knowledge of precautions       PT Treatment Interventions DME instruction;Gait training;Functional mobility training;Therapeutic activities;Therapeutic exercise;Balance training;Cognitive  remediation;Neuromuscular re-education;Patient/family education;Wheelchair mobility training    PT Goals (Current goals can be found in the Care Plan section)  Acute Rehab PT Goals Patient Stated Goal: be able to care for himself again before returning home PT Goal Formulation: With patient Time For Goal Achievement: 01/27/24 Potential to Achieve Goals: Fair    Frequency Min 2X/week     Co-evaluation               AM-PAC PT "6 Clicks" Mobility  Outcome Measure Help needed turning from your back to your side while in a flat bed without using bedrails?: None Help needed moving from lying on your back to sitting on the side of a flat bed without using bedrails?: A Little Help needed moving to and from a bed to a chair (including a wheelchair)?: A Little Help needed standing up from a chair using your arms (e.g., wheelchair or bedside chair)?: A Little Help needed to walk in hospital room?: A Lot Help needed climbing 3-5 steps with a railing? : Total 6 Click Score: 16    End of Session Equipment Utilized During Treatment: Gait belt Activity Tolerance: Patient tolerated treatment well Patient left: in chair;with call bell/phone within reach;with chair alarm set Nurse Communication:  (Nurse tech: 1 person min assist transfer. Needs new male purewick) PT Visit Diagnosis: Unsteadiness on feet (R26.81);Muscle weakness (generalized) (M62.81);Difficulty in walking, not elsewhere classified (R26.2);Other symptoms and signs involving the nervous system (R29.898)  Time: 2952-8413 PT Time Calculation (min) (ACUTE ONLY): 23 min   Charges:   PT Evaluation $PT Eval Low Complexity: 1 Low PT Treatments $Therapeutic Activity: 8-22 mins PT General Charges $$ ACUTE PT VISIT: 1 Visit         Jory Ng, PT, DPT [facility]  Rehabilitation Services Physical Therapist Office: [phone] Website: [email or web address]   Alinda Irani 01/13/2024, 1:11 PM

## 2024-01-14 DIAGNOSIS — R6520 Severe sepsis without septic shock: Secondary | ICD-10-CM | POA: Diagnosis not present

## 2024-01-14 DIAGNOSIS — A419 Sepsis, unspecified organism: Secondary | ICD-10-CM | POA: Diagnosis not present

## 2024-01-14 LAB — COMPREHENSIVE METABOLIC PANEL WITH GFR
ALT: 37 U/L (ref 0–44)
AST: 43 U/L — ABNORMAL HIGH (ref 15–41)
Albumin: 2.1 g/dL — ABNORMAL LOW (ref 3.5–5.0)
Alkaline Phosphatase: 82 U/L (ref 38–126)
Anion gap: 8 (ref 5–15)
BUN: 52 mg/dL — ABNORMAL HIGH (ref 8–23)
CO2: 19 mmol/L — ABNORMAL LOW (ref 22–32)
Calcium: 8.3 mg/dL — ABNORMAL LOW (ref 8.9–10.3)
Chloride: 112 mmol/L — ABNORMAL HIGH (ref 98–111)
Creatinine, Ser: 2.43 mg/dL — ABNORMAL HIGH (ref 0.61–1.24)
GFR, Estimated: 27 mL/min — ABNORMAL LOW
Glucose, Bld: 96 mg/dL (ref 70–99)
Potassium: 4.2 mmol/L (ref 3.5–5.1)
Sodium: 139 mmol/L (ref 135–145)
Total Bilirubin: 0.5 mg/dL (ref 0.0–1.2)
Total Protein: 5.5 g/dL — ABNORMAL LOW (ref 6.5–8.1)

## 2024-01-14 LAB — MAGNESIUM: Magnesium: 1.9 mg/dL (ref 1.7–2.4)

## 2024-01-14 LAB — CBC WITH DIFFERENTIAL/PLATELET
Abs Immature Granulocytes: 0.2 10*3/uL — ABNORMAL HIGH (ref 0.00–0.07)
Basophils Absolute: 0.1 10*3/uL (ref 0.0–0.1)
Basophils Relative: 0 %
Eosinophils Absolute: 0.2 10*3/uL (ref 0.0–0.5)
Eosinophils Relative: 1 %
HCT: 26.3 % — ABNORMAL LOW (ref 39.0–52.0)
Hemoglobin: 8.8 g/dL — ABNORMAL LOW (ref 13.0–17.0)
Immature Granulocytes: 1 %
Lymphocytes Relative: 6 %
Lymphs Abs: 1.1 10*3/uL (ref 0.7–4.0)
MCH: 30.7 pg (ref 26.0–34.0)
MCHC: 33.5 g/dL (ref 30.0–36.0)
MCV: 91.6 fL (ref 80.0–100.0)
Monocytes Absolute: 0.7 10*3/uL (ref 0.1–1.0)
Monocytes Relative: 4 %
Neutro Abs: 15.1 10*3/uL — ABNORMAL HIGH (ref 1.7–7.7)
Neutrophils Relative %: 88 %
Platelets: 115 10*3/uL — ABNORMAL LOW (ref 150–400)
RBC: 2.87 MIL/uL — ABNORMAL LOW (ref 4.22–5.81)
RDW: 16.1 % — ABNORMAL HIGH (ref 11.5–15.5)
WBC: 17.3 10*3/uL — ABNORMAL HIGH (ref 4.0–10.5)
nRBC: 0 % (ref 0.0–0.2)

## 2024-01-14 LAB — PROCALCITONIN: Procalcitonin: 123.54 ng/mL

## 2024-01-14 LAB — C-REACTIVE PROTEIN: CRP: 12.2 mg/dL — ABNORMAL HIGH

## 2024-01-14 MED ORDER — ALUM & MAG HYDROXIDE-SIMETH 200-200-20 MG/5ML PO SUSP
30.0000 mL | Freq: Four times a day (QID) | ORAL | Status: DC | PRN
  Administered 2024-01-14: 30 mL via ORAL
  Filled 2024-01-14: qty 30

## 2024-01-14 NOTE — Plan of Care (Signed)

## 2024-01-14 NOTE — Progress Notes (Signed)
 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 He had several days of worsening weakness and altered mental status and decision was made for him to be assessed in the ED. He was subsequently admitted on 5.14 with a diagnosis of Sepsis related to UTI. Per Dr. Tessie Fila with [facility] this is a related hospital admission.  [name] remains in the hospital with no discharge plans at this time.  He continues to receive IV antibiotics to treat his severe sepsis.  Discharge plan has not been discussed and MD states he is not medically stable for discharge.  [name] remains appropriate for GIP LOC requiring IV medications for sepsis with frequent assessment by skilled team to monitor effectiveness of interventions provided.  Vital Signs: T 97.1 oral, BP 137/85, P 68, R 14, Oxi 93%  Abnormal Labs: Chloride 112, CO2 19, BUN 52, Creat 2.43, Ca 8.3, Albumin  2.1, AST 43, Total Protein 5.5, GFR 27, CRP 12.2, WBC 17.3, RBC 2.87, HGB 8.8, HCT 26.3, RDW 16.1, Platelets 115  Diagnostics:  no new   I&O: Intake Output Net -  IV Medication/PRN's: Rocephin  2g daily  Hospital Problem List:  per Dr. Lynnwood Sauer progress note 5.18.25  Severe sepsis secondary to UTI, POA E. coli bacteremia. Follow urine culture and sensitivity including blood cultures.  History of BPH on Flomax , stable renal ultrasound, with empiric IV Rocephin  improving, continue to monitor, sepsis pathophysiology much improved.   Infrarenal abdominal aortic aneurysm measuring 6.6 cm Seen by vascular surgery outpatient. Declined surgical intervention, with home hospice.  Discussed with brother, DNR, continue medical treatment.  No heroics.   AKI on CKD 3B, suspect prerenal in the setting of poor oral intake Presented with BUN 64 and creatinine of 3.84. Baseline creatinine 2.19  with GFR of 31.  Proved after hydration and packed RBC transfusion monitor..   Non-anion gap metabolic acidosis in the setting of lactic acidosis Received IV fluids and antibiotics.  Will discontinue IV fluids.  Encourage oral hydration.   Elevated troponin, suspect demand ischemia in the setting of severe sepsis Troponin 48, repeat 34, non-ACS pattern, likely due to mild demand mismatch from symptomatic anemia.  No chest pain.  No further workup.   Symptomatic anemia- Anemia of chronic disease in the setting of CKD 3B Status post 1 unit of packed RBC.  Hemoglobin today at 7.8.  Continue to monitor no signs of ongoing bleeding.   Left lower extremity edema and pain, rule out DVT Supportive care much improved, ultrasound negative for DVT.   Thrombocytopenia Platelet count of 73.  Continue to monitor.  Check CBC in AM.   BPH Resume home tamsulosin .   Closely monitor for urinary retention.  Continue intake and output charting.   Chronic HFpEF Euvolemic at this time.  Continue intake and output charting.   Tobacco use disorder Current smoker.  Continue Nicotine  patch   Chronic hypotension On midodrine  10 mg prior to admission.  Continue midodrine    Prior CVA Resume home regimen Fall precautions  Discharge Planning:  ongoing Family Contact- None at bedside- left message for Russ Course, patient's brother. IDT- Updated Goals of Care- ongoing  Please call with any hospice related questions or concerns.  Isaac Junker, RN Nurse Liaison [phone]

## 2024-01-14 NOTE — Progress Notes (Signed)
 PROGRESS NOTE                                                                                                                                                                                                             Patient Demographics:    Isaac Stokes, is a 73 y.o. male, DOB - 1951/08/12, ZOX:[id number]  Outpatient Primary MD for the patient is [facility]    LOS - 3  Admit date - 01/11/2024    Chief Complaint  Patient presents with   Hypotension   Weakness       Brief Narrative (HPI from H&P)   73 y.o. male with medical history significant for infrarenal abdominal aortic aneurysm, prior CVA, chronic hypotension on midodrine , BPH, chronic HFpEF, current smoker, presented to hospital with generalized weakness decreased oral intake.  Hospice nurse checked on the patient and was noted to be hypotensive so EMS was called in.  Systolic blood pressure was noted to be in the 60s and patient received normal saline bolus and was brought into the hospital.  In the ED code sepsis was activated.  He was found to have E. coli bacteremia with UTI, urine likely being the source.   Subjective:   Patient in bed, appears comfortable, denies any headache, no fever, no chest pain or pressure, no shortness of breath , no abdominal pain. No new focal weakness.   Assessment  & Plan :    Severe sepsis secondary to UTI, POA E. coli bacteremia. Follow urine culture and sensitivity including blood cultures.  History of BPH on Flomax , stable renal ultrasound, with empiric IV Rocephin  improving, continue to monitor, sepsis pathophysiology much improved.   Infrarenal abdominal aortic aneurysm measuring 6.6 cm Seen by vascular surgery outpatient. Declined surgical intervention, with home hospice.  Discussed with brother, DNR, continue medical treatment.  No heroics.   AKI on CKD 3B, suspect prerenal in the setting of poor oral  intake Presented with BUN 64 and creatinine of 3.84. Baseline creatinine 2.19 with GFR of 31.  Proved after hydration and packed RBC transfusion monitor..   Non-anion gap metabolic acidosis in the setting of lactic acidosis Received IV fluids and antibiotics.  Will discontinue IV fluids.  Encourage oral hydration.   Elevated troponin, suspect demand ischemia in the setting of severe sepsis Troponin 48, repeat 34, non-ACS pattern,  likely due to mild demand mismatch from symptomatic anemia.  No chest pain.  No further workup.   Symptomatic anemia- Anemia of chronic disease in the setting of CKD 3B Status post 1 unit of packed RBC.  Hemoglobin today at 7.8.  Continue to monitor no signs of ongoing bleeding.   Left lower extremity edema and pain, rule out DVT Supportive care much improved, ultrasound negative for DVT.   Thrombocytopenia Platelet count of 73.  Continue to monitor.  Check CBC in AM.   BPH Resume home tamsulosin .   Closely monitor for urinary retention.  Continue intake and output charting.   Chronic HFpEF Euvolemic at this time.  Continue intake and output charting.   Tobacco use disorder Current smoker.  Continue Nicotine  patch   Chronic hypotension On midodrine  10 mg prior to admission.  Continue midodrine    Prior CVA Resume home regimen Fall precautions        Condition - Extremely Guarded  Family Communication  : Discussed with brother [phone]  over the phone on 05/15/2024, DNR, continue gentle medical treatment, may be open to SNF with palliative care  Code Status :  DNR  Consults  :  None  PUD Prophylaxis : PPI   Procedures  :     Renal US   Bilateral lower extremity venous duplex.  No DVT.      Disposition Plan  :    Status is: Inpatient  DVT Prophylaxis  :  Heparin     Lab Results  Component Value Date   PLT 115 (L) 01/14/2024    Diet :  Diet Order             Diet Heart Fluid consistency: Thin  Diet effective now                     Inpatient Medications  Scheduled Meds:  sodium chloride    Intravenous Once   escitalopram  5 mg Oral Daily   heparin  injection (subcutaneous)  5,000 Units Subcutaneous Q8H   midodrine   10 mg Oral TID WC   nicotine   7 mg Transdermal Daily   pantoprazole  40 mg Oral Daily   tamsulosin   0.4 mg Oral Daily   Continuous Infusions:  cefTRIAXone  (ROCEPHIN )  IV 2 g (01/13/24 1721)   PRN Meds:.acetaminophen , alum & mag hydroxide-simeth, bisacodyl, ipratropium-albuterol , LORazepam , melatonin, oxyCODONE , polyethylene glycol, prochlorperazine     Objective:   Vitals:   01/14/24 0300 01/14/24 0405 01/14/24 0408 01/14/24 0859  BP:  137/85  (!) 131/92  Pulse:  68 66 73  Resp: 16 14 18 16   Temp:  (!) 97.1 F (36.2 C)  97.6 F (36.4 C)  TempSrc:  Oral  Oral  SpO2:  93% 94% 95%  Weight:   66.8 kg     Wt Readings from Last 3 Encounters:  01/14/24 66.8 kg  11/06/23 63 kg  12/12/22 63 kg     Intake/Output Summary (Last 24 hours) at 01/14/2024 1018 Last data filed at 01/14/2024 8657 Gross per 24 hour  Intake 560 ml  Output 840 ml  Net -280 ml     Physical Exam  Awake mildly confused, No new F.N deficits, Normal affect [place].AT,PERRAL Supple Neck, No JVD,   Symmetrical Chest wall movement, Good air movement bilaterally, CTAB RRR,No Gallops,Rubs or new Murmurs,  +ve B.Sounds, Abd Soft, No tenderness,   No Cyanosis, Clubbing or edema       Data Review:    Recent Labs  Lab 01/11/24 1647 01/12/24  4098 01/13/24 0432 01/14/24 0705  WBC 20.2* 18.5* 16.9* 17.3*  HGB 7.2* 7.8* 8.2* 8.8*  HCT 22.7* 24.6* 24.1* 26.3*  PLT 95* 73* 103* 115*  MCV 98.3 100.0 92.0 91.6  MCH 31.2 31.7 31.3 30.7  MCHC 31.7 31.7 34.0 33.5  RDW 16.0* 16.2* 16.4* 16.1*  LYMPHSABS 0.9  --  0.5* 1.1  MONOABS 0.8  --  0.3 0.7  EOSABS 0.0  --  0.2 0.2  BASOSABS 0.1  --  0.2* 0.1    Recent Labs  Lab 01/11/24 1647 01/11/24 1710 01/11/24 1851 01/12/24 0549 01/12/24 1507  01/13/24 0432 01/14/24 0705  NA 135  --   --  136  --  137 139  K 5.1  --   --  5.0  --  4.3 4.2  CL 107  --   --  110  --  113* 112*  CO2 17*  --   --  15*  --  16* 19*  ANIONGAP 11  --   --  11  --  8 8  GLUCOSE 80  --   --  61*  --  63* 96  BUN 64*  --   --  64*  --  64* 52*  CREATININE 3.84*  --   --  3.34*  --  2.85* 2.43*  AST 30  --   --  38  --   --  43*  ALT 15  --   --  19  --   --  37  ALKPHOS 42  --   --  54  --   --  82  BILITOT 0.5  --   --  0.9  --   --  0.5  ALBUMIN  2.4*  --   --  2.4*  --   --  2.1*  PROCALCITON  --   --   --  >150.00  --   --   --   LATICACIDVEN  --  3.3* 4.5* 2.6* 1.7  --   --   INR 1.5*  --   --   --   --   --   --   BNP  --   --   --  1,191.4*  --   --   --   MG  --   --   --  1.8  --  1.9 1.9  PHOS  --   --   --  5.0*  --   --   --   CALCIUM  7.6*  --   --  7.6*  --  7.9* 8.3*      Recent Labs  Lab 01/11/24 1647 01/11/24 1710 01/11/24 1851 01/12/24 0549 01/12/24 1507 01/13/24 0432 01/14/24 0705  PROCALCITON  --   --   --  >150.00  --   --   --   LATICACIDVEN  --  3.3* 4.5* 2.6* 1.7  --   --   INR 1.5*  --   --   --   --   --   --   BNP  --   --   --  7,829.5*  --   --   --   MG  --   --   --  1.8  --  1.9 1.9  CALCIUM  7.6*  --   --  7.6*  --  7.9* 8.3*    --------------------------------------------------------------------------------------------------------------- Lab Results  Component Value Date   CHOL 234 (H) 01/27/2021   HDL 40 (L) 01/27/2021  LDLCALC 166 (H) 01/27/2021   TRIG 140 01/27/2021   CHOLHDL 5.9 01/27/2021    Lab Results  Component Value Date   HGBA1C 5.5 01/27/2021   No results for input(s): "TSH", "T4TOTAL", "FREET4", "T3FREE", "THYROIDAB" in the last 72 hours. No results for input(s): "VITAMINB12", "FOLATE", "FERRITIN", "TIBC", "IRON", "RETICCTPCT" in the last 72 hours. ------------------------------------------------------------------------------------------------------------------ Cardiac  Enzymes No results for input(s): "CKMB", "TROPONINI", "MYOGLOBIN" in the last 168 hours.  Invalid input(s): "CK"  Micro Results Recent Results (from the past 240 hours)  Blood Culture (routine x 2)     Status: Abnormal (Preliminary result)   Collection Time: 01/11/24  4:47 PM   Specimen: BLOOD  Result Value Ref Range Status   Specimen Description BLOOD RIGHT ANTECUBITAL  Final   Special Requests   Final    BOTTLES DRAWN AEROBIC AND ANAEROBIC Blood Culture adequate volume   Culture  Setup Time   Final    GRAM NEGATIVE RODS IN BOTH AEROBIC AND ANAEROBIC BOTTLES CRITICAL RESULT CALLED TO, READ BACK BY AND VERIFIED WITH: PHARMD JEREMY FRENS ON 01/12/24 @ 1447 BY DRT Performed at [facility], [address], [place], [place] [address]    Culture ESCHERICHIA COLI (A)  Final   Report Status PENDING  Incomplete   Organism ID, Bacteria ESCHERICHIA COLI  Final   Organism ID, Bacteria ESCHERICHIA COLI  Final      Susceptibility   Escherichia coli - KIRBY BAUER*    CEFAZOLIN SENSITIVE Sensitive    Escherichia coli - MIC*    AMPICILLIN <=2 SENSITIVE Sensitive     CEFEPIME  <=0.12 SENSITIVE Sensitive     CEFTAZIDIME <=1 SENSITIVE Sensitive     CEFTRIAXONE  <=0.25 SENSITIVE Sensitive     CIPROFLOXACIN <=0.25 SENSITIVE Sensitive     GENTAMICIN <=1 SENSITIVE Sensitive     IMIPENEM <=0.25 SENSITIVE Sensitive     TRIMETH/SULFA <=20 SENSITIVE Sensitive     AMPICILLIN/SULBACTAM <=2 SENSITIVE Sensitive     PIP/TAZO <=4 SENSITIVE Sensitive ug/mL    * ESCHERICHIA COLI    ESCHERICHIA COLI  Blood Culture ID Panel (Reflexed)     Status: Abnormal   Collection Time: 01/11/24  4:47 PM  Result Value Ref Range Status   Enterococcus faecalis NOT DETECTED NOT DETECTED Final   Enterococcus Faecium NOT DETECTED NOT DETECTED Final   Listeria monocytogenes NOT DETECTED NOT DETECTED Final   Staphylococcus species NOT DETECTED NOT DETECTED Final   Staphylococcus aureus (BCID) NOT DETECTED NOT DETECTED  Final   Staphylococcus epidermidis NOT DETECTED NOT DETECTED Final   Staphylococcus lugdunensis NOT DETECTED NOT DETECTED Final   Streptococcus species NOT DETECTED NOT DETECTED Final   Streptococcus agalactiae NOT DETECTED NOT DETECTED Final   Streptococcus pneumoniae NOT DETECTED NOT DETECTED Final   Streptococcus pyogenes NOT DETECTED NOT DETECTED Final   A.calcoaceticus-baumannii NOT DETECTED NOT DETECTED Final   Bacteroides fragilis NOT DETECTED NOT DETECTED Final   Enterobacterales DETECTED (A) NOT DETECTED Final    Comment: Enterobacterales represent a large order of gram negative bacteria, not a single organism. CRITICAL RESULT CALLED TO, READ BACK BY AND VERIFIED WITH: PHARMD JEREMY FRENS ON 01/12/24 @ 1447 BY DRT    Enterobacter cloacae complex NOT DETECTED NOT DETECTED Final   Escherichia coli DETECTED (A) NOT DETECTED Final    Comment: CRITICAL RESULT CALLED TO, READ BACK BY AND VERIFIED WITH: PHARMD JEREMY FRENS ON 01/12/24 @ 1447 BY DRT    Klebsiella aerogenes NOT DETECTED NOT DETECTED Final   Klebsiella oxytoca NOT DETECTED  NOT DETECTED Final   Klebsiella pneumoniae NOT DETECTED NOT DETECTED Final   Proteus species NOT DETECTED NOT DETECTED Final   Salmonella species NOT DETECTED NOT DETECTED Final   Serratia marcescens NOT DETECTED NOT DETECTED Final   Haemophilus influenzae NOT DETECTED NOT DETECTED Final   Neisseria meningitidis NOT DETECTED NOT DETECTED Final   Pseudomonas aeruginosa NOT DETECTED NOT DETECTED Final   Stenotrophomonas maltophilia NOT DETECTED NOT DETECTED Final   Candida albicans NOT DETECTED NOT DETECTED Final   Candida auris NOT DETECTED NOT DETECTED Final   Candida glabrata NOT DETECTED NOT DETECTED Final   Candida krusei NOT DETECTED NOT DETECTED Final   Candida parapsilosis NOT DETECTED NOT DETECTED Final   Candida tropicalis NOT DETECTED NOT DETECTED Final   Cryptococcus neoformans/gattii NOT DETECTED NOT DETECTED Final   CTX-M ESBL NOT  DETECTED NOT DETECTED Final   Carbapenem resistance IMP NOT DETECTED NOT DETECTED Final   Carbapenem resistance KPC NOT DETECTED NOT DETECTED Final   Carbapenem resistance NDM NOT DETECTED NOT DETECTED Final   Carbapenem resist OXA 48 LIKE NOT DETECTED NOT DETECTED Final   Carbapenem resistance VIM NOT DETECTED NOT DETECTED Final    Comment: Performed at [facility], [address], [place], [place] [address]  Blood Culture (routine x 2)     Status: None (Preliminary result)   Collection Time: 01/11/24  5:32 PM   Specimen: BLOOD LEFT ARM  Result Value Ref Range Status   Specimen Description BLOOD LEFT ARM  Final   Special Requests   Final    BOTTLES DRAWN AEROBIC AND ANAEROBIC Blood Culture adequate volume   Culture  Setup Time   Final    GRAM NEGATIVE RODS AEROBIC BOTTLE ONLY CRITICAL VALUE NOTED.  VALUE IS CONSISTENT WITH PREVIOUSLY REPORTED AND CALLED VALUE. Performed at [facility], [address], [place], [place] [address]    Culture GRAM NEGATIVE RODS  Final   Report Status PENDING  Incomplete  Urine Culture     Status: Abnormal   Collection Time: 01/11/24  8:38 PM   Specimen: Urine, Catheterized  Result Value Ref Range Status   Specimen Description URINE, CATHETERIZED  Final   Special Requests NONE Reflexed from [id number]  Final   Culture (A)  Final    <10,000 COLONIES/mL INSIGNIFICANT GROWTH Performed at [facility], [address] [place], [place] [address]    Report Status 01/12/2024 FINAL  Final    Radiology Report US  RENAL Result Date: 01/13/2024 CLINICAL DATA:  73 year old male with urinary tract infection. EXAM: RENAL / URINARY TRACT ULTRASOUND COMPLETE COMPARISON:  Noncontrast CT Abdomen and Pelvis 12/12/2022. FINDINGS: Right Kidney: Renal measurements: 9.1 x 3.5 x 4.8 cm = volume: 80 mL. Small right kidney with echogenic cortex. But no hydronephrosis or mass. Some maintained corticomedullary differentiation. Left Kidney: Renal measurements:  10.6 x 4.2 x 4.5 cm = volume: 103 mL. No left hydronephrosis or mass. Some echogenic left renal cortex but maintained corticomedullary differentiation. Bladder: Impression from prostatomegaly suspected on the base of the bladder rather than layering debris or mass (series 1, image 24), appears similar to the CT appearance last month. Other: Marked abdominal aortic aneurysm redemonstrated, see details and follow-up recommendation on 12/12/2022. IMPRESSION: 1. Evidence of chronic kidney disease with no acute finding. 2. Known severe abdominal aortic aneurysm, see details and follow-up recommendation on 12/12/2022 CT. Electronically Signed   By: Marlise Simpers M.D.   On: 01/13/2024 11:31     Signature  -  Lynnwood Sauer M.D on 01/14/2024 at 10:18 AM   -  To page go to [email or web address]

## 2024-01-14 NOTE — Plan of Care (Signed)
 Pt has rested quietly throughout the night with no distress noted. Alert and oriented to person and place. On room air. SR on the monitor. Purewick intact to suction. No complaints voiced.     Problem: Education: Goal: Knowledge of General Education information will improve Description: Including pain rating scale, medication(s)/side effects and non-pharmacologic comfort measures Outcome: Progressing   Problem: Clinical Measurements: Goal: Respiratory complications will improve Outcome: Progressing Goal: Cardiovascular complication will be avoided Outcome: Progressing   Problem: Activity: Goal: Risk for activity intolerance will decrease Outcome: Progressing   Problem: Pain Managment: Goal: General experience of comfort will improve and/or be controlled Outcome: Progressing

## 2024-01-15 ENCOUNTER — Inpatient Hospital Stay (HOSPITAL_COMMUNITY)

## 2024-01-15 DIAGNOSIS — D696 Thrombocytopenia, unspecified: Secondary | ICD-10-CM

## 2024-01-15 DIAGNOSIS — D649 Anemia, unspecified: Secondary | ICD-10-CM | POA: Diagnosis not present

## 2024-01-15 DIAGNOSIS — R933 Abnormal findings on diagnostic imaging of other parts of digestive tract: Secondary | ICD-10-CM | POA: Diagnosis not present

## 2024-01-15 DIAGNOSIS — R6520 Severe sepsis without septic shock: Secondary | ICD-10-CM | POA: Diagnosis not present

## 2024-01-15 DIAGNOSIS — I714 Abdominal aortic aneurysm, without rupture, unspecified: Secondary | ICD-10-CM | POA: Diagnosis not present

## 2024-01-15 DIAGNOSIS — A419 Sepsis, unspecified organism: Secondary | ICD-10-CM | POA: Diagnosis not present

## 2024-01-15 LAB — CBC WITH DIFFERENTIAL/PLATELET
Abs Immature Granulocytes: 0.21 10*3/uL — ABNORMAL HIGH (ref 0.00–0.07)
Basophils Absolute: 0.1 10*3/uL (ref 0.0–0.1)
Basophils Relative: 1 %
Eosinophils Absolute: 0.4 10*3/uL (ref 0.0–0.5)
Eosinophils Relative: 4 %
HCT: 23.5 % — ABNORMAL LOW (ref 39.0–52.0)
Hemoglobin: 7.7 g/dL — ABNORMAL LOW (ref 13.0–17.0)
Immature Granulocytes: 2 %
Lymphocytes Relative: 18 %
Lymphs Abs: 1.7 10*3/uL (ref 0.7–4.0)
MCH: 30.2 pg (ref 26.0–34.0)
MCHC: 32.8 g/dL (ref 30.0–36.0)
MCV: 92.2 fL (ref 80.0–100.0)
Monocytes Absolute: 0.6 10*3/uL (ref 0.1–1.0)
Monocytes Relative: 6 %
Neutro Abs: 6.9 10*3/uL (ref 1.7–7.7)
Neutrophils Relative %: 69 %
Platelets: 95 10*3/uL — ABNORMAL LOW (ref 150–400)
RBC: 2.55 MIL/uL — ABNORMAL LOW (ref 4.22–5.81)
RDW: 15.9 % — ABNORMAL HIGH (ref 11.5–15.5)
WBC: 9.9 10*3/uL (ref 4.0–10.5)
nRBC: 0 % (ref 0.0–0.2)

## 2024-01-15 LAB — CULTURE, BLOOD (ROUTINE X 2): Special Requests: ADEQUATE

## 2024-01-15 LAB — COMPREHENSIVE METABOLIC PANEL WITH GFR
ALT: 29 U/L (ref 0–44)
AST: 28 U/L (ref 15–41)
Albumin: 1.9 g/dL — ABNORMAL LOW (ref 3.5–5.0)
Alkaline Phosphatase: 66 U/L (ref 38–126)
Anion gap: 7 (ref 5–15)
BUN: 46 mg/dL — ABNORMAL HIGH (ref 8–23)
CO2: 19 mmol/L — ABNORMAL LOW (ref 22–32)
Calcium: 8.3 mg/dL — ABNORMAL LOW (ref 8.9–10.3)
Chloride: 111 mmol/L (ref 98–111)
Creatinine, Ser: 2.11 mg/dL — ABNORMAL HIGH (ref 0.61–1.24)
GFR, Estimated: 32 mL/min — ABNORMAL LOW (ref >60)
Glucose, Bld: 76 mg/dL (ref 70–99)
Potassium: 4 mmol/L (ref 3.5–5.1)
Sodium: 137 mmol/L (ref 135–145)
Total Bilirubin: 0.4 mg/dL (ref 0.0–1.2)
Total Protein: 4.7 g/dL — ABNORMAL LOW (ref 6.5–8.1)

## 2024-01-15 LAB — C-REACTIVE PROTEIN: CRP: 6.9 mg/dL — ABNORMAL HIGH (ref <1.0)

## 2024-01-15 LAB — MAGNESIUM: Magnesium: 1.8 mg/dL (ref 1.7–2.4)

## 2024-01-15 LAB — PROCALCITONIN: Procalcitonin: 63.58 ng/mL

## 2024-01-15 MED ORDER — MIDODRINE HCL 5 MG PO TABS
5.0000 mg | ORAL_TABLET | Freq: Three times a day (TID) | ORAL | Status: DC
  Administered 2024-01-15 – 2024-01-18 (×9): 5 mg via ORAL
  Filled 2024-01-15 (×10): qty 1

## 2024-01-15 MED ORDER — CEFAZOLIN SODIUM-DEXTROSE 2-4 GM/100ML-% IV SOLN
2.0000 g | Freq: Three times a day (TID) | INTRAVENOUS | Status: DC
  Administered 2024-01-15 – 2024-01-16 (×5): 2 g via INTRAVENOUS
  Filled 2024-01-15 (×6): qty 100

## 2024-01-15 NOTE — TOC Initial Note (Addendum)
 Transition of Care [facility]) - Initial/Assessment Note    Patient Details  Name: Isaac Stokes MRN: [id number] Date of Birth: August 27, 1951  [facility] CM/SW Contact:    Jannice Mends, LCSW Phone Number: 01/15/2024, 8:54 AM  Clinical Narrative:                 8:54am-Patient admitted from home with [facility]. CSW will speak with Hospice team to determine patient's baseline and potential need to revoke Hospice to go to SNF for rehab.   9:53 AM-CSW confirmed with Hospice that patient lives alone and has had recent decline in memory so SNF rehab may be needed.   CSW spoke with patient's brother. Patient's brother reported that patient resides alone and he is currently unable to care for patient at home given patient's current physical needs and cognitive decline. Patient's brother expressed understanding of PT recommendation and is agreeable to SNF placement at time of discharge. Patient's brother reports preference for [facility] with a facility that would allow pt to smoke outside. CSW discussed revoking Hospice and adding Palliative care with [facility] and explained insurance authorization process. Will provide Medicare SNF ratings list. Once Hospice revokes care, insurance will revert to traditional Medicare A and B and start back with [facility] Medicare on 6/1. CSW will send out referrals for review and provide bed offers as available.   Skilled Nursing Rehab Facilities-   [email or web address]   Ratings out of 5 stars (5 the highest)  Name Address  Phone # Quality Care Staffing Health Inspection Overall  [facility] [address] [place] [phone] 3 1 4 3   [facility] [address], [place] [phone] 5 2 4 5   [facility] [address], [place] [phone] 2 1 1 1   [facility] [address], [place] [phone] 4 3 4 4   [facility]  [address], [place] [phone] 5 3 5 5   [facility] [address], [facility] [phone] 5 3 2 3   [facility] [address], [place] [phone] 5 1 2 2   [facility] [address], [place] [phone] 2 3 3 3   [address] ([place]) [address], [place] [phone] 1 3 3 2   [facility] [address] [place], [place] [phone] 3 1 2 1   [facility] ([place]) [address], [place] [phone] 3 1 1 1   [facility] [address] [place] [phone] 3 3 4 4   [facility] [address] [place] [phone] 4 4 3 3   [facility]) [address], [place] [phone] 1 1 4 2           [facility] [address] [place] [phone] 3 1 5 4   [facility] [address], [place] [phone] 4 1 1 1   [facility]  [address] [place] [phone] 2 3 2 2   [facility] [address] [phone] 2 2 4 4   [facility] [address] [place] [address], [place] [phone] 2 2 3 3           [facility] [address] [place] [phone] 2 1 2 1   [facility] (No [employer]) [address], [place] [phone] 5 4 5 5   [facility] [address], [place] [phone] 3 4 2 2   [facility] [address] [place] [phone] 3 1 2 1   [facility] [address] [place], [place] [phone] 3 2 2  2  [facility] [address] [place] [phone] 1 3 3 2   [facility] [address], [place] [phone] 2 2 3 3   [facility] [address] [place], [place] [phone] 2 1 4 3   [facility] [address], [facility] [phone] 2 1 1 1   [facility] [address] [place], [place] [phone] 2 1 2 1   [facility] [address] [place] [phone] 3 1 2 1           [facility] [address], [place] [phone] 4 1 2 1   [facility] [address] [place]   [phone] 3 4 4 4   [facility] (No [employer]) [address], [place] [phone] 3 2 4 4   [facility]) [address], [place] [phone] 2 2 3 3   [facility] [address] [place] [phone] 5 3 5 5   [facility] [address], [place] [phone] 2 1 1 1   [facility] [address] [place], [place] [phone] 3 5 5 5           [facility] [address] [place], [place] [phone] 5 4 5 5   [facility])  [address], [place] [phone] 1 1 2 1   [facility]) [address] [place] [phone]  2 4 4   [facility] [address], [place] [phone] 3 5 5 5   [address] [place], [place] [phone] 4 2 2 2   [facility]) [address] [place] [phone] 1 1 3 1       Barriers to Discharge: Continued Medical Work up   Patient Goals and CMS Choice            Expected Discharge Plan and Services In-house Referral: Clinical Social Work     Living arrangements for the past 2 months: Apartment                                      Prior Living Arrangements/Services Living arrangements for the past 2 months: Apartment Lives with:: Self Patient language and need for interpreter reviewed:: Yes Do you feel safe going back to the place where you live?: Yes      Need for Family Participation in Patient Care: Yes (Comment) Care giver support system in place?: Yes (comment) Current home services: DME (shower seat, wc) Criminal Activity/Legal Involvement Pertinent to Current Situation/Hospitalization: No - Comment as needed  Activities of Daily Living   ADL Screening (condition at time of admission) Independently performs ADLs?: No Does the patient have a NEW difficulty with bathing/dressing/toileting/self-feeding that is expected to last >3 days?: No Does the patient have a NEW difficulty with  getting in/out of bed, walking, or climbing stairs that is expected to last >3 days?: No Does the patient have a NEW difficulty with communication that is expected to last >3 days?: No Is the patient deaf or have difficulty hearing?: No Does the patient have difficulty seeing, even when wearing glasses/contacts?: No Does the patient have difficulty concentrating, remembering, or making decisions?: Yes  Permission Sought/Granted Permission sought to share information with : Facility Medical sales representative, Family Supports Permission granted to share information with : No  Share Information with NAME: Isaac Stokes  Permission granted to share info w AGENCY: [facility]  Permission granted to share info w  Relationship: Brother  Permission granted to share info w Contact Information: [phone]  Emotional Assessment Appearance:: Appears stated age Attitude/Demeanor/Rapport: Unable to Assess Affect (typically observed): Unable to Assess Orientation: : Oriented to Self, Oriented to Place Alcohol / Substance Use: Not Applicable Psych Involvement: No (comment)  Admission diagnosis:  Severe sepsis (HCC) [A41.9, R65.20] Patient Active Problem List   Diagnosis Date Noted   Severe sepsis (HCC) 01/11/2024   Syncope 09/07/2022   Aortic arch atherosclerosis (HCC)    Cerebrovascular accident (CVA) (HCC) 04/29/2021   CVA (cerebral vascular accident) (HCC) 03/27/2021   CKD (chronic kidney disease) stage 3, GFR 30-59 ml/min (HCC) 03/27/2021   Renal insufficiency 01/27/2021   Polyuria 01/27/2021   Chest pain 01/27/2021   Tobacco use 01/27/2021   Acute CVA (cerebrovascular accident) (HCC) 01/26/2021   PCP:  [facility] Pharmacy:   [facility] - [place], [place] - [address] [place] [address] Phone: [phone] Fax: [phone]     Social Drivers of Health (SDOH) Social History: SDOH Screenings   Food Insecurity: Patient Unable To Answer  (01/12/2024)  Housing: Patient Unable To Answer (01/12/2024)  Transportation Needs: Patient Unable To Answer (01/12/2024)  Utilities: Patient Unable To Answer (01/12/2024)  Social Connections: Patient Unable To Answer (01/12/2024)  Tobacco Use: High Risk (01/11/2024)   SDOH Interventions:     Readmission Risk Interventions     No data to display

## 2024-01-15 NOTE — Progress Notes (Signed)
 PROGRESS NOTE                                                                                                                                                                                                             Patient Demographics:    Isaac Stokes, is a 73 y.o. male, DOB - Jul 12, 1951, ZOX:[id number]  Outpatient Primary MD for the patient is [facility]    LOS - 4  Admit date - 01/11/2024    Chief Complaint  Patient presents with   Hypotension   Weakness       Brief Narrative (HPI from H&P)   73 y.o. male with medical history significant for infrarenal abdominal aortic aneurysm, prior CVA, chronic hypotension on midodrine , BPH, chronic HFpEF, current smoker, presented to hospital with generalized weakness decreased oral intake.  Hospice nurse checked on the patient and was noted to be hypotensive so EMS was called in.  Systolic blood pressure was noted to be in the 60s and patient received normal saline bolus and was brought into the hospital.  In the ED code sepsis was activated.  He was found to have E. coli bacteremia with UTI, urine likely being the source.   Subjective:   Patient in bed, appears comfortable, denies any headache, no fever, no chest pain or pressure, no shortness of breath , no abdominal pain, at home had some nonspecific epigastric and substernal heaviness which has now since resolved. No new focal weakness.   Assessment  & Plan :    Severe sepsis secondary to UTI, POA E. coli bacteremia. Follow urine culture and sensitivity including blood cultures.  History of BPH on Flomax , stable renal ultrasound, with empiric IV Rocephin  improving, continue to monitor, sepsis pathophysiology much improved.   Infrarenal abdominal aortic aneurysm measuring 6.6 cm now 7 cms Seen by vascular surgery outpatient. Declined surgical intervention, with home hospice.  Discussed with brother, DNR,  continue medical treatment.  No heroics.   Nonspecific upper abdominal discomfort.  CT suggestive of possible advanced duodenal ulcer or inflammation, GI consulted may require EGD.  PPI for now.    AKI on CKD 3B, suspect prerenal in the setting of poor oral intake Presented with BUN 64 and creatinine of 3.84. Baseline creatinine 2.19 with GFR of 31.  Proved after hydration and packed RBC transfusion monitor.Aaron Aas  Non-anion gap metabolic acidosis in the setting of lactic acidosis Received IV fluids and antibiotics.  Will discontinue IV fluids.  Encourage oral hydration.   Elevated troponin, suspect demand ischemia in the setting of severe sepsis Troponin 48, repeat 34, non-ACS pattern, likely due to mild demand mismatch from symptomatic anemia.  No chest pain.  No further workup.   Symptomatic anemia- Anemia of chronic disease in the setting of CKD 3B Status post 1 unit of packed RBC.  Hemoglobin today at 7.8.  Continue to monitor no signs of ongoing bleeding.   Left lower extremity edema and pain, rule out DVT Supportive care much improved, ultrasound negative for DVT.   Thrombocytopenia Platelet count of 73.  Continue to monitor.  Check CBC in AM.   BPH Resume home tamsulosin .   Closely monitor for urinary retention.  Continue intake and output charting.   Chronic HFpEF Euvolemic at this time.  Continue intake and output charting.   Tobacco use disorder Current smoker.  Continue Nicotine  patch   Chronic hypotension On midodrine  10 mg prior to admission.  Continue midodrine    Prior CVA Resume home regimen Fall precautions        Condition - Extremely Guarded  Family Communication  : Discussed with brother [phone]  over the phone on 05/15/2024, DNR, continue gentle medical treatment, may be open to SNF with palliative care  Code Status :  DNR  Consults  :  GI  PUD Prophylaxis : PPI   Procedures  :     CT chest -abdominal pelvis.    1. There is wall thickening  involving the pylorus and descending duodenum with surrounding hazy soft tissue edema. Findings are concerning for with acute duodenitis. There is a structure within the right upper quadrant of the abdomen adjacent to the proximal duodenum/pylorus has an air-fluid level. This may represent a duodenal diverticulum or ulcer. This measures approximately 4.4 x 4.0 cm. These changes are suboptimally visualized due to lack of IV an oral contrast. Consider further evaluation with upper endoscopy. 2. Small bilateral pleural effusions with overlying areas of platelike atelectasis and volume loss in the lower lobes. 3. Scattered patchy areas of ground-glass attenuation noted within bilateral upper lobes, and right middle lobe. Findings are nonspecific and may reflect atypical infection or inflammation. 4. Small volume of abdominopelvic ascites. 5. Infrarenal abdominal aortic aneurysm is identified. This has a maximum AP dimension of 7.8 cm. On the previous exam using the same measurements scheme as today this had a maximum AP dimension of 7.0 cm. Recommend referral to or continued care with vascular specialist. (Ref.: J Vasc Surg. 2018; 67:2-77 and J Am Coll Radiol 2013;10(10):789-794.) 6. Small amount of gas identified within the bladder. Correlate for any recent instrumentation. 7. Coronary artery calcifications. 8. Aortic Atherosclerosis (ICD10-I70.0) and Emphysema (ICD10-J43.9).  Renal US  - Non acute  Bilateral lower extremity venous duplex.  No DVT.      Disposition Plan  :    Status is: Inpatient  DVT Prophylaxis  :  Heparin     Lab Results  Component Value Date   PLT 95 (L) 01/15/2024    Diet :  Diet Order             Diet Heart Fluid consistency: Thin  Diet effective now                    Inpatient Medications  Scheduled Meds:  sodium chloride    Intravenous Once   escitalopram   5 mg Oral  Daily   heparin  injection (subcutaneous)  5,000 Units Subcutaneous Q8H   midodrine   5 mg Oral  TID WC   nicotine   7 mg Transdermal Daily   pantoprazole   40 mg Oral Daily   tamsulosin   0.4 mg Oral Daily   Continuous Infusions:  cefTRIAXone  (ROCEPHIN )  IV 2 g (01/14/24 1720)   PRN Meds:.acetaminophen , alum & mag hydroxide-simeth, bisacodyl , ipratropium-albuterol , LORazepam , melatonin, oxyCODONE , polyethylene glycol, prochlorperazine     Objective:   Vitals:   01/14/24 1945 01/14/24 2035 01/15/24 0326 01/15/24 0423  BP: 129/88  124/88 134/86  Pulse: 79  65   Resp: 18 18 18 12   Temp: (!) 97.5 F (36.4 C) 98.2 F (36.8 C) 98.6 F (37 C) 98.2 F (36.8 C)  TempSrc: Oral Oral Oral Oral  SpO2: 94%  95%   Weight:    71.3 kg    Wt Readings from Last 3 Encounters:  01/15/24 71.3 kg  11/06/23 63 kg  12/12/22 63 kg     Intake/Output Summary (Last 24 hours) at 01/15/2024 1002 Last data filed at 01/15/2024 0941 Gross per 24 hour  Intake 540 ml  Output 2300 ml  Net -1760 ml     Physical Exam  Awake mildly confused, No new F.N deficits, Normal affect [place].AT,PERRAL Supple Neck, No JVD,   Symmetrical Chest wall movement, Good air movement bilaterally, CTAB RRR,No Gallops,Rubs or new Murmurs,  +ve B.Sounds, Abd Soft, No tenderness,   No Cyanosis, Clubbing or edema       Data Review:    Recent Labs  Lab 01/11/24 1647 01/12/24 0549 01/13/24 0432 01/14/24 0705 01/15/24 0449  WBC 20.2* 18.5* 16.9* 17.3* 9.9  HGB 7.2* 7.8* 8.2* 8.8* 7.7*  HCT 22.7* 24.6* 24.1* 26.3* 23.5*  PLT 95* 73* 103* 115* 95*  MCV 98.3 100.0 92.0 91.6 92.2  MCH 31.2 31.7 31.3 30.7 30.2  MCHC 31.7 31.7 34.0 33.5 32.8  RDW 16.0* 16.2* 16.4* 16.1* 15.9*  LYMPHSABS 0.9  --  0.5* 1.1 1.7  MONOABS 0.8  --  0.3 0.7 0.6  EOSABS 0.0  --  0.2 0.2 0.4  BASOSABS 0.1  --  0.2* 0.1 0.1    Recent Labs  Lab 01/11/24 1647 01/11/24 1710 01/11/24 1851 01/12/24 0549 01/12/24 1507 01/13/24 0432 01/14/24 0705 01/15/24 0449  NA 135  --   --  136  --  137 139 137  K 5.1  --   --  5.0  --  4.3 4.2 4.0   CL 107  --   --  110  --  113* 112* 111  CO2 17*  --   --  15*  --  16* 19* 19*  ANIONGAP 11  --   --  11  --  8 8 7   GLUCOSE 80  --   --  61*  --  63* 96 76  BUN 64*  --   --  64*  --  64* 52* 46*  CREATININE 3.84*  --   --  3.34*  --  2.85* 2.43* 2.11*  AST 30  --   --  38  --   --  43* 28  ALT 15  --   --  19  --   --  37 29  ALKPHOS 42  --   --  54  --   --  82 66  BILITOT 0.5  --   --  0.9  --   --  0.5 0.4  ALBUMIN  2.4*  --   --  2.4*  --   --  2.1* 1.9*  CRP  --   --   --   --   --   --  12.2* 6.9*  PROCALCITON  --   --   --  >150.00  --   --  123.54  --   LATICACIDVEN  --  3.3* 4.5* 2.6* 1.7  --   --   --   INR 1.5*  --   --   --   --   --   --   --   BNP  --   --   --  1,610.9*  --   --   --   --   MG  --   --   --  1.8  --  1.9 1.9 1.8  PHOS  --   --   --  5.0*  --   --   --   --   CALCIUM  7.6*  --   --  7.6*  --  7.9* 8.3* 8.3*      Recent Labs  Lab 01/11/24 1647 01/11/24 1710 01/11/24 1851 01/12/24 0549 01/12/24 1507 01/13/24 0432 01/14/24 0705 01/15/24 0449  CRP  --   --   --   --   --   --  12.2* 6.9*  PROCALCITON  --   --   --  >150.00  --   --  123.54  --   LATICACIDVEN  --  3.3* 4.5* 2.6* 1.7  --   --   --   INR 1.5*  --   --   --   --   --   --   --   BNP  --   --   --  6,045.4*  --   --   --   --   MG  --   --   --  1.8  --  1.9 1.9 1.8  CALCIUM  7.6*  --   --  7.6*  --  7.9* 8.3* 8.3*    --------------------------------------------------------------------------------------------------------------- Lab Results  Component Value Date   CHOL 234 (H) 01/27/2021   HDL 40 (L) 01/27/2021   LDLCALC 166 (H) 01/27/2021   TRIG 140 01/27/2021   CHOLHDL 5.9 01/27/2021    Lab Results  Component Value Date   HGBA1C 5.5 01/27/2021   No results for input(s): "TSH", "T4TOTAL", "FREET4", "T3FREE", "THYROIDAB" in the last 72 hours. No results for input(s): "VITAMINB12", "FOLATE", "FERRITIN", "TIBC", "IRON", "RETICCTPCT" in the last 72  hours. ------------------------------------------------------------------------------------------------------------------ Cardiac Enzymes No results for input(s): "CKMB", "TROPONINI", "MYOGLOBIN" in the last 168 hours.  Invalid input(s): "CK"  Micro Results Recent Results (from the past 240 hours)  Blood Culture (routine x 2)     Status: Abnormal   Collection Time: 01/11/24  4:47 PM   Specimen: BLOOD  Result Value Ref Range Status   Specimen Description BLOOD RIGHT ANTECUBITAL  Final   Special Requests   Final    BOTTLES DRAWN AEROBIC AND ANAEROBIC Blood Culture adequate volume   Culture  Setup Time   Final    GRAM NEGATIVE RODS IN BOTH AEROBIC AND ANAEROBIC BOTTLES CRITICAL RESULT CALLED TO, READ BACK BY AND VERIFIED WITH: PHARMD JEREMY FRENS ON 01/12/24 @ 1447 BY DRT Performed at [facility], [address], [place], [place] [address]    Culture ESCHERICHIA COLI (A)  Final   Report Status 01/15/2024 FINAL  Final   Organism ID, Bacteria ESCHERICHIA COLI  Final   Organism ID,  Bacteria ESCHERICHIA COLI  Final      Susceptibility   Escherichia coli - KIRBY BAUER*    CEFAZOLIN  SENSITIVE Sensitive    Escherichia coli - MIC*    AMPICILLIN <=2 SENSITIVE Sensitive     CEFEPIME  <=0.12 SENSITIVE Sensitive     CEFTAZIDIME <=1 SENSITIVE Sensitive     CEFTRIAXONE  <=0.25 SENSITIVE Sensitive     CIPROFLOXACIN <=0.25 SENSITIVE Sensitive     GENTAMICIN <=1 SENSITIVE Sensitive     IMIPENEM <=0.25 SENSITIVE Sensitive     TRIMETH/SULFA <=20 SENSITIVE Sensitive     AMPICILLIN/SULBACTAM <=2 SENSITIVE Sensitive     PIP/TAZO <=4 SENSITIVE Sensitive ug/mL    * ESCHERICHIA COLI    ESCHERICHIA COLI  Blood Culture ID Panel (Reflexed)     Status: Abnormal   Collection Time: 01/11/24  4:47 PM  Result Value Ref Range Status   Enterococcus faecalis NOT DETECTED NOT DETECTED Final   Enterococcus Faecium NOT DETECTED NOT DETECTED Final   Listeria monocytogenes NOT DETECTED NOT DETECTED Final    Staphylococcus species NOT DETECTED NOT DETECTED Final   Staphylococcus aureus (BCID) NOT DETECTED NOT DETECTED Final   Staphylococcus epidermidis NOT DETECTED NOT DETECTED Final   Staphylococcus lugdunensis NOT DETECTED NOT DETECTED Final   Streptococcus species NOT DETECTED NOT DETECTED Final   Streptococcus agalactiae NOT DETECTED NOT DETECTED Final   Streptococcus pneumoniae NOT DETECTED NOT DETECTED Final   Streptococcus pyogenes NOT DETECTED NOT DETECTED Final   A.calcoaceticus-baumannii NOT DETECTED NOT DETECTED Final   Bacteroides fragilis NOT DETECTED NOT DETECTED Final   Enterobacterales DETECTED (A) NOT DETECTED Final    Comment: Enterobacterales represent a large order of gram negative bacteria, not a single organism. CRITICAL RESULT CALLED TO, READ BACK BY AND VERIFIED WITH: PHARMD JEREMY FRENS ON 01/12/24 @ 1447 BY DRT    Enterobacter cloacae complex NOT DETECTED NOT DETECTED Final   Escherichia coli DETECTED (A) NOT DETECTED Final    Comment: CRITICAL RESULT CALLED TO, READ BACK BY AND VERIFIED WITH: PHARMD JEREMY FRENS ON 01/12/24 @ 1447 BY DRT    Klebsiella aerogenes NOT DETECTED NOT DETECTED Final   Klebsiella oxytoca NOT DETECTED NOT DETECTED Final   Klebsiella pneumoniae NOT DETECTED NOT DETECTED Final   Proteus species NOT DETECTED NOT DETECTED Final   Salmonella species NOT DETECTED NOT DETECTED Final   Serratia marcescens NOT DETECTED NOT DETECTED Final   Haemophilus influenzae NOT DETECTED NOT DETECTED Final   Neisseria meningitidis NOT DETECTED NOT DETECTED Final   Pseudomonas aeruginosa NOT DETECTED NOT DETECTED Final   Stenotrophomonas maltophilia NOT DETECTED NOT DETECTED Final   Candida albicans NOT DETECTED NOT DETECTED Final   Candida auris NOT DETECTED NOT DETECTED Final   Candida glabrata NOT DETECTED NOT DETECTED Final   Candida krusei NOT DETECTED NOT DETECTED Final   Candida parapsilosis NOT DETECTED NOT DETECTED Final   Candida tropicalis NOT  DETECTED NOT DETECTED Final   Cryptococcus neoformans/gattii NOT DETECTED NOT DETECTED Final   CTX-M ESBL NOT DETECTED NOT DETECTED Final   Carbapenem resistance IMP NOT DETECTED NOT DETECTED Final   Carbapenem resistance KPC NOT DETECTED NOT DETECTED Final   Carbapenem resistance NDM NOT DETECTED NOT DETECTED Final   Carbapenem resist OXA 48 LIKE NOT DETECTED NOT DETECTED Final   Carbapenem resistance VIM NOT DETECTED NOT DETECTED Final    Comment: Performed at [facility], [address], [place], [place] [address]  Blood Culture (routine x 2)     Status: Abnormal  Collection Time: 01/11/24  5:32 PM   Specimen: BLOOD LEFT ARM  Result Value Ref Range Status   Specimen Description BLOOD LEFT ARM  Final   Special Requests   Final    BOTTLES DRAWN AEROBIC AND ANAEROBIC Blood Culture adequate volume   Culture  Setup Time   Final    GRAM NEGATIVE RODS AEROBIC BOTTLE ONLY CRITICAL VALUE NOTED.  VALUE IS CONSISTENT WITH PREVIOUSLY REPORTED AND CALLED VALUE.    Culture (A)  Final    ESCHERICHIA COLI SUSCEPTIBILITIES PERFORMED ON PREVIOUS CULTURE WITHIN THE LAST 5 DAYS. Performed at [facility], [address] [place], [place] [address]    Report Status 01/15/2024 FINAL  Final  Urine Culture     Status: Abnormal   Collection Time: 01/11/24  8:38 PM   Specimen: Urine, Catheterized  Result Value Ref Range Status   Specimen Description URINE, CATHETERIZED  Final   Special Requests NONE Reflexed from [id number]  Final   Culture (A)  Final    <10,000 COLONIES/mL INSIGNIFICANT GROWTH Performed at [facility], [address] [place], [place] [address]    Report Status 01/12/2024 FINAL  Final    Radiology Report CT CHEST ABDOMEN PELVIS WO CONTRAST Result Date: 01/15/2024 CLINICAL DATA:  Sepsis.  Hypotension and weakness. EXAM: CT CHEST, ABDOMEN AND PELVIS WITHOUT CONTRAST TECHNIQUE: Multidetector CT imaging of the chest, abdomen and pelvis was performed following the  standard protocol without IV contrast. RADIATION DOSE REDUCTION: This exam was performed according to the departmental dose-optimization program which includes automated exposure control, adjustment of the mA and/or kV according to patient size and/or use of iterative reconstruction technique. COMPARISON:  12/12/2022 FINDINGS: CT CHEST FINDINGS Cardiovascular: The heart size appears normal. Aortic atherosclerosis. Coronary artery calcifications. No pericardial effusion. Mediastinum/Nodes: Thyroid gland, trachea, and esophagus appear normal. No enlarged mediastinal or hilar lymph nodes. Lungs/Pleura: Small bilateral pleural effusions with overlying areas of platelike atelectasis and volume loss in the lower lobes. Emphysema with diffuse bronchial wall thickening. Scattered patchy areas of ground-glass attenuation noted within bilateral upper lobes, and right middle lobe. No consolidative change identified. Thickening along the posterior aspect of the major fissure noted. Musculoskeletal: No chest wall mass or suspicious bone lesions identified. CT ABDOMEN PELVIS FINDINGS Hepatobiliary: No focal liver abnormality. Gallbladder is decompressed. No bile duct dilatation. Pancreas: Unremarkable. No pancreatic ductal dilatation or surrounding inflammatory changes. Spleen: Normal in size without focal abnormality. Adrenals/Urinary Tract: Normal adrenal glands. No nephrolithiasis or hydronephrosis identified. There is a small amount of gas identified within the bladder. No bladder calculi or focal lesion noted. Stomach/Bowel: Assessment for bowel pathology is limited due to lack of IV contrast material. There is wall thickening involving the pylorus and descending duodenum with surrounding hazy soft tissue edema this is structure within the right upper quadrant of the abdomen adjacent to the proximal duodenum/pylorus has an air-fluid level, image 75/3. This may represent a duodenal diverticulum or ulcer. This measures  approximately 4.4 x 4.0 cm, image 74/3. Again this is difficult to further characterize due to lack of IV an oral contrast. The jejunum and remaining small bowel loops have a normal caliber without signs of wall thickening or inflammation. No pathologic dilatation of the colon. Moderate retained stool noted within the ascending colon and transverse colon. Vascular/Lymphatic: Extensive aortic atherosclerosis. Infrarenal abdominal aortic aneurysm is identified. This has a maximum AP dimension of 7.8 cm, image 88/3. On the previous exam using the same measurements scheme as today this had  a maximum AP dimension of 7.0 cm. No signs abdominopelvic adenopathy. Reproductive: Prostate is unremarkable. Other: There is a small volume of abdominopelvic ascites. No discrete fluid collections. No signs of pneumoperitoneum. Musculoskeletal: Thoracolumbar scoliosis with multilevel degenerative disc disease. No acute or suspicious osseous findings. IMPRESSION: 1. There is wall thickening involving the pylorus and descending duodenum with surrounding hazy soft tissue edema. Findings are concerning for with acute duodenitis. There is a structure within the right upper quadrant of the abdomen adjacent to the proximal duodenum/pylorus has an air-fluid level. This may represent a duodenal diverticulum or ulcer. This measures approximately 4.4 x 4.0 cm. These changes are suboptimally visualized due to lack of IV an oral contrast. Consider further evaluation with upper endoscopy. 2. Small bilateral pleural effusions with overlying areas of platelike atelectasis and volume loss in the lower lobes. 3. Scattered patchy areas of ground-glass attenuation noted within bilateral upper lobes, and right middle lobe. Findings are nonspecific and may reflect atypical infection or inflammation. 4. Small volume of abdominopelvic ascites. 5. Infrarenal abdominal aortic aneurysm is identified. This has a maximum AP dimension of 7.8 cm. On the previous  exam using the same measurements scheme as today this had a maximum AP dimension of 7.0 cm. Recommend referral to or continued care with vascular specialist. (Ref.: J Vasc Surg. 2018; 67:2-77 and J Am Coll Radiol 2013;10(10):789-794.) 6. Small amount of gas identified within the bladder. Correlate for any recent instrumentation. 7. Coronary artery calcifications. 8. Aortic Atherosclerosis (ICD10-I70.0) and Emphysema (ICD10-J43.9). Electronically Signed   By: Kimberley Penman M.D.   On: 01/15/2024 08:37   US  RENAL Result Date: 01/13/2024 CLINICAL DATA:  73 year old male with urinary tract infection. EXAM: RENAL / URINARY TRACT ULTRASOUND COMPLETE COMPARISON:  Noncontrast CT Abdomen and Pelvis 12/12/2022. FINDINGS: Right Kidney: Renal measurements: 9.1 x 3.5 x 4.8 cm = volume: 80 mL. Small right kidney with echogenic cortex. But no hydronephrosis or mass. Some maintained corticomedullary differentiation. Left Kidney: Renal measurements: 10.6 x 4.2 x 4.5 cm = volume: 103 mL. No left hydronephrosis or mass. Some echogenic left renal cortex but maintained corticomedullary differentiation. Bladder: Impression from prostatomegaly suspected on the base of the bladder rather than layering debris or mass (series 1, image 24), appears similar to the CT appearance last month. Other: Marked abdominal aortic aneurysm redemonstrated, see details and follow-up recommendation on 12/12/2022. IMPRESSION: 1. Evidence of chronic kidney disease with no acute finding. 2. Known severe abdominal aortic aneurysm, see details and follow-up recommendation on 12/12/2022 CT. Electronically Signed   By: Marlise Simpers M.D.   On: 01/13/2024 11:31     Signature  -   Lynnwood Sauer M.D on 01/15/2024 at 10:02 AM   -  To page go to [email or web address]

## 2024-01-15 NOTE — Plan of Care (Signed)
 Pt has rested quietly throughout the night with no distress noted. Alert and oriented to person and place. Pt get hostile with staff at times arguing and cussing. On room air. SR on the monitor. Purewick intact to suction. Pt up to BR with assist last evening for BM. No complaints voiced.     Problem: Clinical Measurements: Goal: Respiratory complications will improve Outcome: Progressing Goal: Cardiovascular complication will be avoided Outcome: Progressing   Problem: Activity: Goal: Risk for activity intolerance will decrease Outcome: Progressing   Problem: Coping: Goal: Level of anxiety will decrease Outcome: Progressing   Problem: Pain Managment: Goal: General experience of comfort will improve and/or be controlled Outcome: Progressing

## 2024-01-15 NOTE — Consult Note (Signed)
 Consultation  Referring Provider:  [facility]/ Zelda Hickman Primary Care Physician:  [facility] Primary Gastroenterologist:  unassigned  Reason for Consultation:  anemia, abnormal CT of stomach /pylorus  HPI: Isaac Stokes is a 73 y.o. 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  Also with chronic kidney disease stage IIIb. Patient was admitted from home 3 days ago after found by his home hospice nurse to be hypotensive. Diagnosed with UTI on admission and found to have E. coli bacteremia for which she has been on antibiotics. Patient has a known 6.6 cm infrarenal abdominal aortic aneurysm.  Per notes and per history by the patient today he had been evaluated outpatient by vascular surgery and had also seen a surgeon at [facility] and decision was made not to proceed with surgery due to high risk of procedure. Patient was noted on admission to be anemic with hemoglobin of 7.2/hematocrit 22.7 and platelet count of 95,000-hemoglobin down from baseline in the 10-11 range in 2024 Hemoccult is pending Hemoglobin has remained fairly stable since admission-at 7.7 today hematocrit of 23.5 and platelets 95,000. He underwent CT of the chest abdomen pelvis today without contrast for further evaluation of his sepsis and found to have small bilateral effusions COPD with diffuse bronchial wall thickening, unremarkable pancreas.  There is wall thickening involving the pylorus and descending duodenum with surrounding hazy soft tissue edema and there is a structure in the right upper quadrant of the abdomen adjacent to the proximal duodenum/pylorus which has an air-fluid level and measures 4.4 x 4 cm-this is felt difficult to characterize due to lack of IV and oral contrast, remainder of the small bowel has normal caliber with no evidence of wall thickening,  moderate retained stool, the infrarenal abdominal aortic aneurysm measures 7.8 cm increased in size small volume of abdominal pelvic ascites no pneumoperitoneum..  Patient is very pleasant, but somewhat difficult historian, he says he feels a lot better than when he was admitted, he is currently eating solid food for lunch and says he has a good appetite.  He has not been having any difficulty with nausea or vomiting, he feels that his bowel movements have been normal at home unaware of any melena or hematochezia.  He says he does have a sense of fullness in his abdomen which he thinks is from the aneurysm and he says sometimes he cannot eat very much because of the sense of fullness, also admits to intermittent abdominal discomfort. No regular NSAIDs, no EtOH. Uncertain about any prior endoscopic evaluation. Family at bedside not aware of any complaints of abdominal pain or nausea or vomiting, unaware of any melena or hematochezia.  They are aware that he is DNR and made decision not to have surgery for the aneurysm but brother at bedside does not believe they have made any decisions regarding other procedures sedation etc.     Past Medical History:  Diagnosis Date   AAA (abdominal aortic aneurysm) (HCC)    CVA (cerebral vascular accident) (HCC)    TIA (transient ischemic attack) 03/25/2021   3 this summer of 2022   Vertigo     Past Surgical History:  Procedure Laterality Date   BUBBLE STUDY  07/29/2021   Procedure: BUBBLE STUDY;  Surgeon: Lake Pilgrim, MD;  Location: [facility] ENDOSCOPY;  Service: Cardiovascular;;   TEE WITHOUT CARDIOVERSION N/A 07/29/2021  Procedure: TRANSESOPHAGEAL ECHOCARDIOGRAM (TEE);  Surgeon: Alroy Aspen Lela Purple, MD;  Location: [facility] ENDOSCOPY;  Service: Cardiovascular;  Laterality: N/A;    Prior to Admission medications   Medication Sig Start Date End Date Taking? Authorizing Provider  acetaminophen  (TYLENOL ) 500 MG tablet Take 1,000 mg by mouth every 6 (six) hours as  needed for mild pain (pain score 1-3).   Yes [provider]  bisacodyl  (DULCOLAX) 10 MG suppository Place 10 mg rectally as needed for moderate constipation.   Yes [provider]  escitalopram  (LEXAPRO ) 5 MG tablet Take 5 mg by mouth daily.   Yes [provider]  gabapentin (NEURONTIN) 600 MG tablet Take 600 mg by mouth 3 (three) times daily. 12/08/23  Yes [provider]  hydrOXYzine (ATARAX) 25 MG tablet Take 25 mg by mouth every 4 (four) hours as needed for anxiety, itching, nausea or vomiting. 06/08/23  Yes [provider]  LORazepam  (ATIVAN ) 0.5 MG tablet Take 0.5 mg by mouth every 4 (four) hours as needed for anxiety.   Yes [provider]  LOTRIMIN AF 1 % cream Apply 1 Application topically daily as needed (groin rash). 09/13/23  Yes [provider]  meclizine  (ANTIVERT ) 25 MG tablet Take 25 mg by mouth 3 (three) times daily as needed for dizziness.   Yes [provider]  midodrine  (PROAMATINE ) 10 MG tablet Take 10 mg by mouth 2 (two) times daily as needed (hypotension).   Yes [provider]  oxyCODONE  (OXY IR/ROXICODONE ) 5 MG immediate release tablet Take 5 mg by mouth every 4 (four) hours as needed for moderate pain (pain score 4-6), breakthrough pain or severe pain (pain score 7-10). 11/23/23  Yes [provider]  polyethylene glycol (MIRALAX  / GLYCOLAX ) 17 g packet Take 17 g by mouth daily as needed for mild constipation, moderate constipation or severe constipation.   Yes [provider]  prochlorperazine  (COMPAZINE ) 10 MG tablet Take 10 mg by mouth every 6 (six) hours as needed for nausea or vomiting.   Yes [provider]  SENNA PLUS 8.6-50 MG tablet Take 1 tablet by mouth 2 (two) times daily. 01/03/24  Yes [provider]  tamsulosin  (FLOMAX ) 0.4 MG CAPS capsule Take 0.4 mg by mouth daily.   Yes [provider]    Current Facility-Administered Medications   Medication Dose Route Frequency Provider Last Rate Last Admin   0.9 %  sodium chloride  infusion (Manually program via Guardrails IV Fluids)   Intravenous Once Bary Boss, DO   Held at 01/12/24 0327   acetaminophen  (TYLENOL ) tablet 650 mg  650 mg Oral Q6H PRN Hall, Carole N, DO       alum & mag hydroxide-simeth (MAALOX/MYLANTA) 200-200-20 MG/5ML suspension 30 mL  30 mL Oral Q6H PRN Singh, Prashant K, MD   30 mL at 01/14/24 0901   bisacodyl  (DULCOLAX) suppository 10 mg  10 mg Rectal PRN Singh, Prashant K, MD       ceFAZolin  (ANCEF ) IVPB 2g/100 mL premix  2 g Intravenous Q8H Pham, Minh Q, RPH-CPP 200 mL/hr at 01/15/24 1236 2 g at 01/15/24 1236   escitalopram  (LEXAPRO ) tablet 5 mg  5 mg Oral Daily Singh, Prashant K, MD   5 mg at 01/15/24 0927   heparin  injection 5,000 Units  5,000 Units Subcutaneous Q8H Singh, Prashant K, MD   5,000 Units at 01/15/24 1236   ipratropium-albuterol  (DUONEB) 0.5-2.5 (3) MG/3ML nebulizer solution 3 mL  3 mL Nebulization Q6H PRN Singh, Prashant K, MD  LORazepam  (ATIVAN ) tablet 0.5 mg  0.5 mg Oral BID PRN Singh, Prashant K, MD       melatonin tablet 5 mg  5 mg Oral QHS PRN Reesa Cannon N, DO       midodrine  (PROAMATINE ) tablet 5 mg  5 mg Oral TID WC Singh, Prashant K, MD   5 mg at 01/15/24 1236   nicotine  (NICODERM CQ  - dosed in mg/24 hr) patch 7 mg  7 mg Transdermal Daily Reesa Cannon N, DO   7 mg at 01/12/24 1308   oxyCODONE  (Oxy IR/ROXICODONE ) immediate release tablet 5 mg  5 mg Oral Q6H PRN Bary Boss, DO       pantoprazole  (PROTONIX ) EC tablet 40 mg  40 mg Oral Daily Singh, Prashant K, MD   40 mg at 01/15/24 0920   polyethylene glycol (MIRALAX  / GLYCOLAX ) packet 17 g  17 g Oral Daily PRN Bary Boss, DO       prochlorperazine  (COMPAZINE ) injection 5 mg  5 mg Intravenous Q6H PRN Reesa Cannon N, DO       tamsulosin  (FLOMAX ) capsule 0.4 mg  0.4 mg Oral Daily Hall, Carole N, DO   0.4 mg at 01/15/24 0919    Allergies as of 01/11/2024   (No Known  Allergies)    History reviewed. No pertinent family history.  Social History   Socioeconomic History   Marital status: Single    Spouse name: Not on file   Number of children: Not on file   Years of education: Not on file   Highest education level: Not on file  Occupational History   Not on file  Tobacco Use   Smoking status: Light Smoker    Current packs/day: 0.25    Types: Cigarettes   Smokeless tobacco: Never  Substance and Sexual Activity   Alcohol use: Never   Drug use: Never   Sexual activity: Not on file  Other Topics Concern   Not on file  Social History Narrative   Lives alone in [address]   Right Handed   Drinks 2-3 cups caffeine daily   Social Drivers of Health   Financial Resource Strain: Not on file  Food Insecurity: Patient Unable To Answer (01/12/2024)   Hunger Vital Sign    Worried About Running Out of Food in the Last Year: Patient unable to answer    Ran Out of Food in the Last Year: Patient unable to answer  Transportation Needs: Patient Unable To Answer (01/12/2024)   PRAPARE - Transportation    Lack of Transportation (Medical): Patient unable to answer    Lack of Transportation (Non-Medical): Patient unable to answer  Physical Activity: Not on file  Stress: Not on file  Social Connections: Patient Unable To Answer (01/12/2024)   Social Connection and Isolation Panel [NHANES]    Frequency of Communication with Friends and Family: Patient unable to answer    Frequency of Social Gatherings with Friends and Family: Patient unable to answer    Attends Religious Services: Patient unable to answer    Active Member of Clubs or Organizations: Patient unable to answer    Attends Banker Meetings: Patient unable to answer    Marital Status: Patient unable to answer  Intimate Partner Violence: Patient Unable To Answer (01/12/2024)   Humiliation, Afraid, Rape, and Kick questionnaire    Fear of Current or Ex-Partner: Patient unable to answer     Emotionally Abused: Patient unable to answer    Physically Abused: Patient unable  to answer    Sexually Abused: Patient unable to answer    Review of Systems: Pertinent positive and negative review of systems were noted in the above HPI section.  All other review of systems was otherwise negative.   Physical Exam: Vital signs in last 24 hours: Temp:  [97.5 F (36.4 C)-98.6 F (37 C)] 98.2 F (36.8 C) (05/19 1200) Pulse Rate:  [65-79] 65 (05/19 0326) Resp:  [12-18] 12 (05/19 0423) BP: (118-134)/(84-88) 123/84 (05/19 1200) SpO2:  [94 %-100 %] 97 % (05/19 1200) Weight:  [71.3 kg] 71.3 kg (05/19 0423) Last BM Date :  (PTA) General:   Alert,  Well-developed, thin, frail appearing older WM pleasant and cooperative in NAD- family at bedside Head:  Normocephalic and atraumatic. Eyes:  Sclera clear, no icterus.   Conjunctiva pink. Ears:  Normal auditory acuity. Nose:  No deformity, discharge,  or lesions. Mouth:  No deformity or lesions.   Neck:  Supple; no masses or thyromegaly. Lungs:  Clear throughout to auscultation.   No wheezes, crackles, or rhonchi.  Heart:  Regular rate and rhythm; no murmurs, clicks, rubs,  or gallops. Abdomen:  Soft,no focal tenderness, full feeling, no mass or HSM Rectal: not done Msk:  Symmetrical without gross deformities. . Pulses:  Normal pulses noted. Extremities:  Without clubbing or edema. Neurologic:  Alert and  oriented x3;  grossly normal neurologically, rambling historian Skin:  Intact without significant lesions or rashes.. Psych:  Alert and cooperative. Normal mood and affect.  Intake/Output from previous day: 05/18 0701 - 05/19 0700 In: 540 [P.O.:540] Out: 1300 [Urine:1300] Intake/Output this shift: Total I/O In: -  Out: 1000 [Urine:1000]  Lab Results: Recent Labs    01/13/24 0432 01/14/24 0705 01/15/24 0449  WBC 16.9* 17.3* 9.9  HGB 8.2* 8.8* 7.7*  HCT 24.1* 26.3* 23.5*  PLT 103* 115* 95*   BMET Recent Labs     01/13/24 0432 01/14/24 0705 01/15/24 0449  NA 137 139 137  K 4.3 4.2 4.0  CL 113* 112* 111  CO2 16* 19* 19*  GLUCOSE 63* 96 76  BUN 64* 52* 46*  CREATININE 2.85* 2.43* 2.11*  CALCIUM  7.9* 8.3* 8.3*   LFT Recent Labs    01/15/24 0449  PROT 4.7*  ALBUMIN  1.9*  AST 28  ALT 29  ALKPHOS 66  BILITOT 0.4   PT/INR No results for input(s): "LABPROT", "INR" in the last 72 hours. Hepatitis Panel No results for input(s): "HEPBSAG", "HCVAB", "HEPAIGM", "HEPBIGM" in the last 72 hours.   IMPRESSION:  #45 73 year old white male admitted 3 days ago with progressive weakness and found hypotensive at home. Found to have UTI and E. coli bacteremia for which she has been on antibiotics.  He has significantly improved since admission  #2 kidney injury on chronic kidney disease-improving #3 anemia-most consistent with anemia of chronic disease, no evidence for active GI bleeding.  He did get 1 unit of packed RBC since admission Hemoccult pending  #4 thrombocytopenia-new this admission or at least year. #5 congestive heart failure with preserved EF #6.  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  Patient is not opposed to this if we feel it is indicated.  He is a high risk candidate for sedation given his chronic hypotension.  He did not make a definite decision today regarding proceeding with the  procedure. Once daily PPI Will discuss further with the patient regarding conservative management versus per with endoscopic evaluation and his multitude of comorbidities and enlarging infrarenal abdominal aortic aneurysm. GI will follow with you   Derk Doubek EsterwoodPA-C  01/15/2024, 1:48 PM

## 2024-01-15 NOTE — Care Management Important Message (Signed)
 Important Message  Patient Details  Name: Isaac Stokes MRN: [id number] Date of Birth: 12/05/1950   Important Message Given:  Yes - Medicare IM     Wynonia Hedges 01/15/2024, 3:47 PM

## 2024-01-15 NOTE — Progress Notes (Signed)
   01/15/24 1458  Mobility  Activity Ambulated with assistance in hallway  Level of Assistance Contact guard assist, steadying assist  Assistive Device Front wheel walker  Distance Ambulated (ft) 30 ft  Activity Response Tolerated fair  Mobility Referral Yes  Mobility visit 1 Mobility  Mobility Specialist Start Time (ACUTE ONLY) 1558  Mobility Specialist Stop Time (ACUTE ONLY) 1630  Mobility Specialist Time Calculation (min) (ACUTE ONLY) 32 min   Mobility Specialist: Progress Note  Pre-Mobility:      HR 69, BP 133/83 (100)  Pt agreeable to mobility session - received in chair. Pt was asymptomatic throughout session, pt asking to go outside to smoke. MS assisted pt with ordering dinner.  Pt with studder, needs time to answer questions. Pt needing to have a BM. Returned to [facility] with all needs met - call bell within reach. RN present to assist pt.   Isla Mari, BS Mobility Specialist Please contact via SecureChat or  Rehab office at [phone].

## 2024-01-15 NOTE — NC FL2 (Signed)
 [place]  MEDICAID [id number] LEVEL OF CARE FORM     IDENTIFICATION  Patient Name: Isaac Stokes Birthdate: [birth year] Sex: male Admission Date (Current Location): 01/11/2024  [facility] and [place] Number:  Producer, television/film/video and Address:  [facility], [address] [place], [place] [address]      Provider Number: [id number]  Attending Physician Name and Address:  Cala Castleman, MD  Relative Name and Phone Number:       Current Level of Care: Hospital Recommended Level of Care: Skilled Nursing Facility Prior Approval Number:    Date Approved/Denied:   PASRR Number: [id number]  Discharge Plan: SNF    Current Diagnoses: Patient Active Problem List   Diagnosis Date Noted   Severe sepsis (HCC) 01/11/2024   Syncope 09/07/2022   Aortic arch atherosclerosis (HCC)    Cerebrovascular accident (CVA) (HCC) 04/29/2021   CVA (cerebral vascular accident) (HCC) 03/27/2021   CKD (chronic kidney disease) stage 3, GFR 30-59 ml/min (HCC) 03/27/2021   Renal insufficiency 01/27/2021   Polyuria 01/27/2021   Chest pain 01/27/2021   Tobacco use 01/27/2021   Acute CVA (cerebrovascular accident) (HCC) 01/26/2021    Orientation RESPIRATION BLADDER Height & Weight     Self, Place  Normal Incontinent, External catheter Weight: 157 lb 3 oz (71.3 kg) Height:     BEHAVIORAL SYMPTOMS/MOOD NEUROLOGICAL BOWEL NUTRITION STATUS      Continent Diet (See dc summary)  AMBULATORY STATUS COMMUNICATION OF NEEDS Skin   Limited Assist Verbally Normal                       Personal Care Assistance Level of Assistance  Bathing, Feeding, Dressing Bathing Assistance: Limited assistance Feeding assistance: Limited assistance Dressing Assistance: Limited assistance     Functional Limitations Info             SPECIAL CARE FACTORS FREQUENCY  PT (By licensed PT), OT (By licensed OT)     PT Frequency: 5x/week OT Frequency: 5x/week            Contractures  Contractures Info: Not present    Additional Factors Info  Code Status, Allergies Code Status Info: DNR Allergies Info: Quinolones           Current Medications (01/15/2024):  This is the current hospital active medication list Current Facility-Administered Medications  Medication Dose Route Frequency Provider Last Rate Last Admin   0.9 %  sodium chloride  infusion (Manually program via Guardrails IV Fluids)   Intravenous Once Bary Boss, DO   Held at 01/12/24 0327   acetaminophen  (TYLENOL ) tablet 650 mg  650 mg Oral Q6H PRN Hall, Carole N, DO       alum & mag hydroxide-simeth (MAALOX/MYLANTA) 200-200-20 MG/5ML suspension 30 mL  30 mL Oral Q6H PRN Singh, Prashant K, MD   30 mL at 01/14/24 0901   bisacodyl  (DULCOLAX) suppository 10 mg  10 mg Rectal PRN Singh, Prashant K, MD       ceFAZolin  (ANCEF ) IVPB 2g/100 mL premix  2 g Intravenous Q8H Pham, Minh Q, RPH-CPP 200 mL/hr at 01/15/24 1236 2 g at 01/15/24 1236   escitalopram  (LEXAPRO ) tablet 5 mg  5 mg Oral Daily Singh, Prashant K, MD   5 mg at 01/15/24 5621   heparin  injection 5,000 Units  5,000 Units Subcutaneous Q8H Singh, Prashant K, MD   5,000 Units at 01/15/24 1236   ipratropium-albuterol  (DUONEB) 0.5-2.5 (3) MG/3ML nebulizer solution 3 mL  3 mL  Nebulization Q6H PRN Singh, Prashant K, MD       LORazepam  (ATIVAN ) tablet 0.5 mg  0.5 mg Oral BID PRN Singh, Prashant K, MD       melatonin tablet 5 mg  5 mg Oral QHS PRN Reesa Cannon N, DO       midodrine  (PROAMATINE ) tablet 5 mg  5 mg Oral TID WC Singh, Prashant K, MD   5 mg at 01/15/24 1236   nicotine  (NICODERM CQ  - dosed in mg/24 hr) patch 7 mg  7 mg Transdermal Daily Reesa Cannon N, DO   7 mg at 01/12/24 9604   oxyCODONE  (Oxy IR/ROXICODONE ) immediate release tablet 5 mg  5 mg Oral Q6H PRN Bary Boss, DO       pantoprazole  (PROTONIX ) EC tablet 40 mg  40 mg Oral Daily Singh, Prashant K, MD   40 mg at 01/15/24 0920   polyethylene glycol (MIRALAX  / GLYCOLAX ) packet 17 g  17 g Oral Daily  PRN Bary Boss, DO       prochlorperazine  (COMPAZINE ) injection 5 mg  5 mg Intravenous Q6H PRN Reesa Cannon N, DO       tamsulosin  (FLOMAX ) capsule 0.4 mg  0.4 mg Oral Daily Reesa Cannon N, DO   0.4 mg at 01/15/24 0919     Discharge Medications: Please see discharge summary for a list of discharge medications.  Relevant Imaging Results:  Relevant Lab Results:   Additional Information SSN: [id number]. OP Palliative to follow at SNF.  Nerissa Constantin S Alvetta Hidrogo, LCSW

## 2024-01-15 NOTE — Progress Notes (Signed)
 E.coli is pan sens. Ok to optimize ceftriaxone  to cefazolin  per Dr. Zelda Hickman.  Ivery Marking, PharmD, BCIDP, AAHIVP, CPP Infectious Disease Pharmacist 01/15/2024 11:01 AM

## 2024-01-16 ENCOUNTER — Inpatient Hospital Stay (HOSPITAL_COMMUNITY)

## 2024-01-16 DIAGNOSIS — R6520 Severe sepsis without septic shock: Secondary | ICD-10-CM

## 2024-01-16 DIAGNOSIS — A419 Sepsis, unspecified organism: Secondary | ICD-10-CM

## 2024-01-16 DIAGNOSIS — R933 Abnormal findings on diagnostic imaging of other parts of digestive tract: Secondary | ICD-10-CM

## 2024-01-16 LAB — CBC WITH DIFFERENTIAL/PLATELET
Abs Immature Granulocytes: 0.5 10*3/uL — ABNORMAL HIGH (ref 0.00–0.07)
Basophils Absolute: 0.1 10*3/uL (ref 0.0–0.1)
Basophils Relative: 1 %
Eosinophils Absolute: 0.6 10*3/uL — ABNORMAL HIGH (ref 0.0–0.5)
Eosinophils Relative: 8 %
HCT: 24.5 % — ABNORMAL LOW (ref 39.0–52.0)
Hemoglobin: 8.1 g/dL — ABNORMAL LOW (ref 13.0–17.0)
Immature Granulocytes: 7 %
Lymphocytes Relative: 23 %
Lymphs Abs: 1.6 10*3/uL (ref 0.7–4.0)
MCH: 30.3 pg (ref 26.0–34.0)
MCHC: 33.1 g/dL (ref 30.0–36.0)
MCV: 91.8 fL (ref 80.0–100.0)
Monocytes Absolute: 0.8 10*3/uL (ref 0.1–1.0)
Monocytes Relative: 11 %
Neutro Abs: 3.6 10*3/uL (ref 1.7–7.7)
Neutrophils Relative %: 50 %
Platelets: 103 10*3/uL — ABNORMAL LOW (ref 150–400)
RBC: 2.67 MIL/uL — ABNORMAL LOW (ref 4.22–5.81)
RDW: 15.6 % — ABNORMAL HIGH (ref 11.5–15.5)
WBC: 7.2 10*3/uL (ref 4.0–10.5)
nRBC: 0 % (ref 0.0–0.2)

## 2024-01-16 LAB — COMPREHENSIVE METABOLIC PANEL WITH GFR
ALT: 18 U/L (ref 0–44)
AST: 21 U/L (ref 15–41)
Albumin: 2 g/dL — ABNORMAL LOW (ref 3.5–5.0)
Alkaline Phosphatase: 57 U/L (ref 38–126)
Anion gap: 8 (ref 5–15)
BUN: 34 mg/dL — ABNORMAL HIGH (ref 8–23)
CO2: 20 mmol/L — ABNORMAL LOW (ref 22–32)
Calcium: 8.4 mg/dL — ABNORMAL LOW (ref 8.9–10.3)
Chloride: 106 mmol/L (ref 98–111)
Creatinine, Ser: 1.9 mg/dL — ABNORMAL HIGH (ref 0.61–1.24)
GFR, Estimated: 37 mL/min — ABNORMAL LOW (ref >60)
Glucose, Bld: 83 mg/dL (ref 70–99)
Potassium: 4.1 mmol/L (ref 3.5–5.1)
Sodium: 134 mmol/L — ABNORMAL LOW (ref 135–145)
Total Bilirubin: 0.2 mg/dL (ref 0.0–1.2)
Total Protein: 5.1 g/dL — ABNORMAL LOW (ref 6.5–8.1)

## 2024-01-16 LAB — TYPE AND SCREEN
ABO/RH(D): O POS
Antibody Screen: NEGATIVE

## 2024-01-16 LAB — PROCALCITONIN: Procalcitonin: 41.36 ng/mL

## 2024-01-16 LAB — C-REACTIVE PROTEIN: CRP: 3.5 mg/dL — ABNORMAL HIGH (ref <1.0)

## 2024-01-16 LAB — MAGNESIUM: Magnesium: 1.8 mg/dL (ref 1.7–2.4)

## 2024-01-16 MED ORDER — IOHEXOL 300 MG/ML  SOLN
100.0000 mL | Freq: Once | INTRAMUSCULAR | Status: AC | PRN
  Administered 2024-01-16: 100 mL via ORAL

## 2024-01-16 MED ORDER — IOHEXOL 300 MG/ML  SOLN
100.0000 mL | Freq: Once | INTRAMUSCULAR | Status: AC | PRN
  Administered 2024-01-16: 50 mL via ORAL

## 2024-01-16 NOTE — TOC Progression Note (Addendum)
 Transition of Care [facility]) - Progression Note    Patient Details  Name: Isaac Stokes MRN: [id number] Date of Birth: Feb 07, 1951  [facility] CM/SW Contact  Jannice Mends, LCSW Phone Number: 01/16/2024, 10:09 AM  Clinical Narrative:    10:09 AM-CSW left voicemail for patient's brother to discuss SNF offers.   4:41 PM-Left another voicemail for brother. Hospice is completing revocation form with brother's daughter in law due to transportation issues.     Barriers to Discharge: Continued Medical Work up  Expected Discharge Plan and Services In-house Referral: Clinical Social Work     Living arrangements for the past 2 months: Apartment                                       Social Determinants of Health (SDOH) Interventions SDOH Screenings   Food Insecurity: Patient Unable To Answer (01/12/2024)  Housing: Patient Unable To Answer (01/12/2024)  Transportation Needs: Patient Unable To Answer (01/12/2024)  Utilities: Patient Unable To Answer (01/12/2024)  Social Connections: Patient Unable To Answer (01/12/2024)  Tobacco Use: High Risk (01/11/2024)    Readmission Risk Interventions     No data to display

## 2024-01-16 NOTE — Plan of Care (Signed)
 Pt has rested quietly throughout the night with no distress noted. Alert and oriented to person and place. On room air. SR on the monitor. Purewick intact to suction. No outbursts tonight. No complaints voiced.     Problem: Clinical Measurements: Goal: Respiratory complications will improve Outcome: Progressing Goal: Cardiovascular complication will be avoided Outcome: Progressing   Problem: Activity: Goal: Risk for activity intolerance will decrease Outcome: Progressing   Problem: Coping: Goal: Level of anxiety will decrease Outcome: Progressing   Problem: Pain Managment: Goal: General experience of comfort will improve and/or be controlled Outcome: Progressing

## 2024-01-16 NOTE — Progress Notes (Signed)
 PROGRESS NOTE                                                                                                                                                                                                             Patient Demographics:    Isaac Stokes, is a 73 y.o. male, DOB - 1951/03/11, ZOX:[id number]  Outpatient Primary MD for the patient is [facility]    LOS - 5  Admit date - 01/11/2024    Chief Complaint  Patient presents with   Hypotension   Weakness       Brief Narrative (HPI from H&P)   73 y.o. male with medical history significant for infrarenal abdominal aortic aneurysm, prior CVA, chronic hypotension on midodrine , BPH, chronic HFpEF, current smoker, presented to hospital with generalized weakness decreased oral intake.  Hospice nurse checked on the patient and was noted to be hypotensive so EMS was called in.  Systolic blood pressure was noted to be in the 60s and patient received normal saline bolus and was brought into the hospital.  In the ED code sepsis was activated.  He was found to have E. coli bacteremia with UTI, urine likely being the source.   Subjective:   Patient in bed, appears comfortable, denies any headache, no fever, no chest pain or pressure, no shortness of breath , no abdominal pain. No focal weakness.   Assessment  & Plan :    Severe sepsis secondary to UTI, POA E. coli bacteremia. Follow urine culture and sensitivity including blood cultures.  History of BPH on Flomax , stable renal ultrasound, with empiric IV Rocephin  improving, continue to monitor, sepsis pathophysiology much improved.  Repeat blood cultures on 01/16/2024.   Infrarenal abdominal aortic aneurysm measuring 6.6 cm now 7 cms Seen by vascular surgery outpatient. Declined surgical intervention, with home hospice.  Discussed with brother, DNR, continue medical treatment.  No heroics.   Nonspecific upper  abdominal discomfort.  CT suggestive of possible advanced duodenal ulcer or inflammation, GI consulted may require EGD, GI pursuing barium swallow first on 01/16/2024 follow.  PPI for now.    AKI on CKD 3B, suspect prerenal in the setting of poor oral intake Presented with BUN 64 and creatinine of 3.84. Baseline creatinine 2.19 with GFR of 31.  Proved after hydration and packed RBC transfusion monitor..   Non-anion  gap metabolic acidosis in the setting of lactic acidosis Received IV fluids and antibiotics.  Will discontinue IV fluids.  Encourage oral hydration.   Elevated troponin, suspect demand ischemia in the setting of severe sepsis Troponin 48, repeat 34, non-ACS pattern, likely due to mild demand mismatch from symptomatic anemia.  No chest pain.  No further workup.   Symptomatic anemia- Anemia of chronic disease in the setting of CKD 3B Status post 1 unit of packed RBC.  Hemoglobin today at 7.8.  Continue to monitor no signs of ongoing bleeding.   Left lower extremity edema and pain, rule out DVT Supportive care much improved, ultrasound negative for DVT.   Thrombocytopenia Platelet count of 73.  Continue to monitor.  Check CBC in AM.   BPH Resume home tamsulosin .   Closely monitor for urinary retention.  Continue intake and output charting.   Chronic HFpEF Euvolemic at this time.  Continue intake and output charting.   Tobacco use disorder Current smoker.  Continue Nicotine  patch   Chronic hypotension On midodrine  10 mg prior to admission.  Continue midodrine    Prior CVA Resume home regimen Fall precautions        Condition - Extremely Guarded  Family Communication  : Discussed with brother [phone]  over the phone on 05/15/2024, DNR, continue gentle medical treatment, may be open to SNF with palliative care  Code Status :  DNR  Consults  :  GI  PUD Prophylaxis : PPI   Procedures  :     Barium swallow ordered 01/16/2024.    CT chest -abdominal pelvis.     1. There is wall thickening involving the pylorus and descending duodenum with surrounding hazy soft tissue edema. Findings are concerning for with acute duodenitis. There is a structure within the right upper quadrant of the abdomen adjacent to the proximal duodenum/pylorus has an air-fluid level. This may represent a duodenal diverticulum or ulcer. This measures approximately 4.4 x 4.0 cm. These changes are suboptimally visualized due to lack of IV an oral contrast. Consider further evaluation with upper endoscopy. 2. Small bilateral pleural effusions with overlying areas of platelike atelectasis and volume loss in the lower lobes. 3. Scattered patchy areas of ground-glass attenuation noted within bilateral upper lobes, and right middle lobe. Findings are nonspecific and may reflect atypical infection or inflammation. 4. Small volume of abdominopelvic ascites. 5. Infrarenal abdominal aortic aneurysm is identified. This has a maximum AP dimension of 7.8 cm. On the previous exam using the same measurements scheme as today this had a maximum AP dimension of 7.0 cm. Recommend referral to or continued care with vascular specialist. (Ref.: J Vasc Surg. 2018; 67:2-77 and J Am Coll Radiol 2013;10(10):789-794.) 6. Small amount of gas identified within the bladder. Correlate for any recent instrumentation. 7. Coronary artery calcifications. 8. Aortic Atherosclerosis (ICD10-I70.0) and Emphysema (ICD10-J43.9).  Renal US  - Non acute  Bilateral lower extremity venous duplex.  No DVT.      Disposition Plan  :    Status is: Inpatient  DVT Prophylaxis  :  Heparin     Lab Results  Component Value Date   PLT 103 (L) 01/16/2024    Diet :  Diet Order             Diet Heart Fluid consistency: Thin  Diet effective now                    Inpatient Medications  Scheduled Meds:  sodium chloride    Intravenous Once  escitalopram   5 mg Oral Daily   heparin  injection (subcutaneous)  5,000 Units  Subcutaneous Q8H   midodrine   5 mg Oral TID WC   nicotine   7 mg Transdermal Daily   pantoprazole   40 mg Oral Daily   tamsulosin   0.4 mg Oral Daily   Continuous Infusions:   ceFAZolin  (ANCEF ) IV 2 g (01/16/24 0559)   PRN Meds:.acetaminophen , alum & mag hydroxide-simeth, bisacodyl , ipratropium-albuterol , LORazepam , melatonin, oxyCODONE , polyethylene glycol, prochlorperazine     Objective:   Vitals:   01/15/24 2303 01/16/24 0333 01/16/24 0500 01/16/24 0813  BP: 139/75 (!) 129/91    Pulse: 60 61  60  Resp: 17 12  (!) 28  Temp: 98 F (36.7 C) 98.2 F (36.8 C)  98.4 F (36.9 C)  TempSrc: Oral Oral  Oral  SpO2: 100%   97%  Weight:   65.5 kg     Wt Readings from Last 3 Encounters:  01/16/24 65.5 kg  11/06/23 63 kg  12/12/22 63 kg     Intake/Output Summary (Last 24 hours) at 01/16/2024 1001 Last data filed at 01/16/2024 0700 Gross per 24 hour  Intake --  Output 1700 ml  Net -1700 ml     Physical Exam  Awake mildly confused, No new F.N deficits, Normal affect [place].AT,PERRAL Supple Neck, No JVD,   Symmetrical Chest wall movement, Good air movement bilaterally, CTAB RRR,No Gallops,Rubs or new Murmurs,  +ve B.Sounds, Abd Soft, No tenderness,   No Cyanosis, Clubbing or edema       Data Review:    Recent Labs  Lab 01/11/24 1647 01/12/24 0549 01/13/24 0432 01/14/24 0705 01/15/24 0449 01/16/24 0452  WBC 20.2* 18.5* 16.9* 17.3* 9.9 7.2  HGB 7.2* 7.8* 8.2* 8.8* 7.7* 8.1*  HCT 22.7* 24.6* 24.1* 26.3* 23.5* 24.5*  PLT 95* 73* 103* 115* 95* 103*  MCV 98.3 100.0 92.0 91.6 92.2 91.8  MCH 31.2 31.7 31.3 30.7 30.2 30.3  MCHC 31.7 31.7 34.0 33.5 32.8 33.1  RDW 16.0* 16.2* 16.4* 16.1* 15.9* 15.6*  LYMPHSABS 0.9  --  0.5* 1.1 1.7 1.6  MONOABS 0.8  --  0.3 0.7 0.6 0.8  EOSABS 0.0  --  0.2 0.2 0.4 0.6*  BASOSABS 0.1  --  0.2* 0.1 0.1 0.1    Recent Labs  Lab 01/11/24 1647 01/11/24 1710 01/11/24 1851 01/12/24 0549 01/12/24 1507 01/13/24 0432 01/14/24 0705  01/15/24 0449 01/16/24 0452  NA 135  --   --  136  --  137 139 137 134*  K 5.1  --   --  5.0  --  4.3 4.2 4.0 4.1  CL 107  --   --  110  --  113* 112* 111 106  CO2 17*  --   --  15*  --  16* 19* 19* 20*  ANIONGAP 11  --   --  11  --  8 8 7 8   GLUCOSE 80  --   --  61*  --  63* 96 76 83  BUN 64*  --   --  64*  --  64* 52* 46* 34*  CREATININE 3.84*  --   --  3.34*  --  2.85* 2.43* 2.11* 1.90*  AST 30  --   --  38  --   --  43* 28 21  ALT 15  --   --  19  --   --  37 29 18  ALKPHOS 42  --   --  54  --   --  82 66 57  BILITOT 0.5  --   --  0.9  --   --  0.5 0.4 0.2  ALBUMIN  2.4*  --   --  2.4*  --   --  2.1* 1.9* 2.0*  CRP  --   --   --   --   --   --  12.2* 6.9* 3.5*  PROCALCITON  --   --   --  >150.00  --   --  123.54 63.58  --   LATICACIDVEN  --  3.3* 4.5* 2.6* 1.7  --   --   --   --   INR 1.5*  --   --   --   --   --   --   --   --   BNP  --   --   --  5,409.8*  --   --   --   --   --   MG  --   --   --  1.8  --  1.9 1.9 1.8 1.8  PHOS  --   --   --  5.0*  --   --   --   --   --   CALCIUM  7.6*  --   --  7.6*  --  7.9* 8.3* 8.3* 8.4*      Recent Labs  Lab 01/11/24 1647 01/11/24 1710 01/11/24 1851 01/12/24 0549 01/12/24 1507 01/13/24 0432 01/14/24 0705 01/15/24 0449 01/16/24 0452  CRP  --   --   --   --   --   --  12.2* 6.9* 3.5*  PROCALCITON  --   --   --  >150.00  --   --  123.54 63.58  --   LATICACIDVEN  --  3.3* 4.5* 2.6* 1.7  --   --   --   --   INR 1.5*  --   --   --   --   --   --   --   --   BNP  --   --   --  1,191.4*  --   --   --   --   --   MG  --   --   --  1.8  --  1.9 1.9 1.8 1.8  CALCIUM  7.6*  --   --  7.6*  --  7.9* 8.3* 8.3* 8.4*    --------------------------------------------------------------------------------------------------------------- Lab Results  Component Value Date   CHOL 234 (H) 01/27/2021   HDL 40 (L) 01/27/2021   LDLCALC 166 (H) 01/27/2021   TRIG 140 01/27/2021   CHOLHDL 5.9 01/27/2021    Lab Results  Component Value Date   HGBA1C  5.5 01/27/2021   No results for input(s): "TSH", "T4TOTAL", "FREET4", "T3FREE", "THYROIDAB" in the last 72 hours. No results for input(s): "VITAMINB12", "FOLATE", "FERRITIN", "TIBC", "IRON", "RETICCTPCT" in the last 72 hours. ------------------------------------------------------------------------------------------------------------------ Cardiac Enzymes No results for input(s): "CKMB", "TROPONINI", "MYOGLOBIN" in the last 168 hours.  Invalid input(s): "CK"  Micro Results Recent Results (from the past 240 hours)  Blood Culture (routine x 2)     Status: Abnormal   Collection Time: 01/11/24  4:47 PM   Specimen: BLOOD  Result Value Ref Range Status   Specimen Description BLOOD RIGHT ANTECUBITAL  Final   Special Requests   Final    BOTTLES DRAWN AEROBIC AND ANAEROBIC Blood Culture adequate volume   Culture  Setup Time   Final    GRAM NEGATIVE RODS IN BOTH AEROBIC AND ANAEROBIC  BOTTLES CRITICAL RESULT CALLED TO, READ BACK BY AND VERIFIED WITH: PHARMD JEREMY FRENS ON 01/12/24 @ 1447 BY DRT Performed at [facility], [address], [place], [place] [address]    Culture ESCHERICHIA COLI (A)  Final   Report Status 01/15/2024 FINAL  Final   Organism ID, Bacteria ESCHERICHIA COLI  Final   Organism ID, Bacteria ESCHERICHIA COLI  Final      Susceptibility   Escherichia coli - KIRBY BAUER*    CEFAZOLIN  SENSITIVE Sensitive    Escherichia coli - MIC*    AMPICILLIN <=2 SENSITIVE Sensitive     CEFEPIME  <=0.12 SENSITIVE Sensitive     CEFTAZIDIME <=1 SENSITIVE Sensitive     CEFTRIAXONE  <=0.25 SENSITIVE Sensitive     CIPROFLOXACIN <=0.25 SENSITIVE Sensitive     GENTAMICIN <=1 SENSITIVE Sensitive     IMIPENEM <=0.25 SENSITIVE Sensitive     TRIMETH/SULFA <=20 SENSITIVE Sensitive     AMPICILLIN/SULBACTAM <=2 SENSITIVE Sensitive     PIP/TAZO <=4 SENSITIVE Sensitive ug/mL    * ESCHERICHIA COLI    ESCHERICHIA COLI  Blood Culture ID Panel (Reflexed)     Status: Abnormal   Collection Time:  01/11/24  4:47 PM  Result Value Ref Range Status   Enterococcus faecalis NOT DETECTED NOT DETECTED Final   Enterococcus Faecium NOT DETECTED NOT DETECTED Final   Listeria monocytogenes NOT DETECTED NOT DETECTED Final   Staphylococcus species NOT DETECTED NOT DETECTED Final   Staphylococcus aureus (BCID) NOT DETECTED NOT DETECTED Final   Staphylococcus epidermidis NOT DETECTED NOT DETECTED Final   Staphylococcus lugdunensis NOT DETECTED NOT DETECTED Final   Streptococcus species NOT DETECTED NOT DETECTED Final   Streptococcus agalactiae NOT DETECTED NOT DETECTED Final   Streptococcus pneumoniae NOT DETECTED NOT DETECTED Final   Streptococcus pyogenes NOT DETECTED NOT DETECTED Final   A.calcoaceticus-baumannii NOT DETECTED NOT DETECTED Final   Bacteroides fragilis NOT DETECTED NOT DETECTED Final   Enterobacterales DETECTED (A) NOT DETECTED Final    Comment: Enterobacterales represent a large order of gram negative bacteria, not a single organism. CRITICAL RESULT CALLED TO, READ BACK BY AND VERIFIED WITH: PHARMD JEREMY FRENS ON 01/12/24 @ 1447 BY DRT    Enterobacter cloacae complex NOT DETECTED NOT DETECTED Final   Escherichia coli DETECTED (A) NOT DETECTED Final    Comment: CRITICAL RESULT CALLED TO, READ BACK BY AND VERIFIED WITH: PHARMD JEREMY FRENS ON 01/12/24 @ 1447 BY DRT    Klebsiella aerogenes NOT DETECTED NOT DETECTED Final   Klebsiella oxytoca NOT DETECTED NOT DETECTED Final   Klebsiella pneumoniae NOT DETECTED NOT DETECTED Final   Proteus species NOT DETECTED NOT DETECTED Final   Salmonella species NOT DETECTED NOT DETECTED Final   Serratia marcescens NOT DETECTED NOT DETECTED Final   Haemophilus influenzae NOT DETECTED NOT DETECTED Final   Neisseria meningitidis NOT DETECTED NOT DETECTED Final   Pseudomonas aeruginosa NOT DETECTED NOT DETECTED Final   Stenotrophomonas maltophilia NOT DETECTED NOT DETECTED Final   Candida albicans NOT DETECTED NOT DETECTED Final   Candida  auris NOT DETECTED NOT DETECTED Final   Candida glabrata NOT DETECTED NOT DETECTED Final   Candida krusei NOT DETECTED NOT DETECTED Final   Candida parapsilosis NOT DETECTED NOT DETECTED Final   Candida tropicalis NOT DETECTED NOT DETECTED Final   Cryptococcus neoformans/gattii NOT DETECTED NOT DETECTED Final   CTX-M ESBL NOT DETECTED NOT DETECTED Final   Carbapenem resistance IMP NOT DETECTED NOT DETECTED Final   Carbapenem resistance KPC NOT DETECTED NOT DETECTED Final  Carbapenem resistance NDM NOT DETECTED NOT DETECTED Final   Carbapenem resist OXA 48 LIKE NOT DETECTED NOT DETECTED Final   Carbapenem resistance VIM NOT DETECTED NOT DETECTED Final    Comment: Performed at [facility], [address] [place], [place] [address]  Blood Culture (routine x 2)     Status: Abnormal   Collection Time: 01/11/24  5:32 PM   Specimen: BLOOD LEFT ARM  Result Value Ref Range Status   Specimen Description BLOOD LEFT ARM  Final   Special Requests   Final    BOTTLES DRAWN AEROBIC AND ANAEROBIC Blood Culture adequate volume   Culture  Setup Time   Final    GRAM NEGATIVE RODS IN BOTH AEROBIC AND ANAEROBIC BOTTLES CRITICAL VALUE NOTED.  VALUE IS CONSISTENT WITH PREVIOUSLY REPORTED AND CALLED VALUE.    Culture (A)  Final    ESCHERICHIA COLI SUSCEPTIBILITIES PERFORMED ON PREVIOUS CULTURE WITHIN THE LAST 5 DAYS. Performed at [facility], [address] [place], [place] [address]    Report Status 01/15/2024 FINAL  Final  Urine Culture     Status: Abnormal   Collection Time: 01/11/24  8:38 PM   Specimen: Urine, Catheterized  Result Value Ref Range Status   Specimen Description URINE, CATHETERIZED  Final   Special Requests NONE Reflexed from [id number]  Final   Culture (A)  Final    <10,000 COLONIES/mL INSIGNIFICANT GROWTH Performed at [facility], [address], [place], [place] [address]    Report Status 01/12/2024 FINAL  Final    Radiology Report CT CHEST ABDOMEN PELVIS  WO CONTRAST Result Date: 01/15/2024 CLINICAL DATA:  Sepsis.  Hypotension and weakness. EXAM: CT CHEST, ABDOMEN AND PELVIS WITHOUT CONTRAST TECHNIQUE: Multidetector CT imaging of the chest, abdomen and pelvis was performed following the standard protocol without IV contrast. RADIATION DOSE REDUCTION: This exam was performed according to the departmental dose-optimization program which includes automated exposure control, adjustment of the mA and/or kV according to patient size and/or use of iterative reconstruction technique. COMPARISON:  12/12/2022 FINDINGS: CT CHEST FINDINGS Cardiovascular: The heart size appears normal. Aortic atherosclerosis. Coronary artery calcifications. No pericardial effusion. Mediastinum/Nodes: Thyroid gland, trachea, and esophagus appear normal. No enlarged mediastinal or hilar lymph nodes. Lungs/Pleura: Small bilateral pleural effusions with overlying areas of platelike atelectasis and volume loss in the lower lobes. Emphysema with diffuse bronchial wall thickening. Scattered patchy areas of ground-glass attenuation noted within bilateral upper lobes, and right middle lobe. No consolidative change identified. Thickening along the posterior aspect of the major fissure noted. Musculoskeletal: No chest wall mass or suspicious bone lesions identified. CT ABDOMEN PELVIS FINDINGS Hepatobiliary: No focal liver abnormality. Gallbladder is decompressed. No bile duct dilatation. Pancreas: Unremarkable. No pancreatic ductal dilatation or surrounding inflammatory changes. Spleen: Normal in size without focal abnormality. Adrenals/Urinary Tract: Normal adrenal glands. No nephrolithiasis or hydronephrosis identified. There is a small amount of gas identified within the bladder. No bladder calculi or focal lesion noted. Stomach/Bowel: Assessment for bowel pathology is limited due to lack of IV contrast material. There is wall thickening involving the pylorus and descending duodenum with surrounding  hazy soft tissue edema this is structure within the right upper quadrant of the abdomen adjacent to the proximal duodenum/pylorus has an air-fluid level, image 75/3. This may represent a duodenal diverticulum or ulcer. This measures approximately 4.4 x 4.0 cm, image 74/3. Again this is difficult to further characterize due to lack of IV an oral contrast. The jejunum and remaining small  bowel loops have a normal caliber without signs of wall thickening or inflammation. No pathologic dilatation of the colon. Moderate retained stool noted within the ascending colon and transverse colon. Vascular/Lymphatic: Extensive aortic atherosclerosis. Infrarenal abdominal aortic aneurysm is identified. This has a maximum AP dimension of 7.8 cm, image 88/3. On the previous exam using the same measurements scheme as today this had a maximum AP dimension of 7.0 cm. No signs abdominopelvic adenopathy. Reproductive: Prostate is unremarkable. Other: There is a small volume of abdominopelvic ascites. No discrete fluid collections. No signs of pneumoperitoneum. Musculoskeletal: Thoracolumbar scoliosis with multilevel degenerative disc disease. No acute or suspicious osseous findings. IMPRESSION: 1. There is wall thickening involving the pylorus and descending duodenum with surrounding hazy soft tissue edema. Findings are concerning for with acute duodenitis. There is a structure within the right upper quadrant of the abdomen adjacent to the proximal duodenum/pylorus has an air-fluid level. This may represent a duodenal diverticulum or ulcer. This measures approximately 4.4 x 4.0 cm. These changes are suboptimally visualized due to lack of IV an oral contrast. Consider further evaluation with upper endoscopy. 2. Small bilateral pleural effusions with overlying areas of platelike atelectasis and volume loss in the lower lobes. 3. Scattered patchy areas of ground-glass attenuation noted within bilateral upper lobes, and right middle lobe.  Findings are nonspecific and may reflect atypical infection or inflammation. 4. Small volume of abdominopelvic ascites. 5. Infrarenal abdominal aortic aneurysm is identified. This has a maximum AP dimension of 7.8 cm. On the previous exam using the same measurements scheme as today this had a maximum AP dimension of 7.0 cm. Recommend referral to or continued care with vascular specialist. (Ref.: J Vasc Surg. 2018; 67:2-77 and J Am Coll Radiol 2013;10(10):789-794.) 6. Small amount of gas identified within the bladder. Correlate for any recent instrumentation. 7. Coronary artery calcifications. 8. Aortic Atherosclerosis (ICD10-I70.0) and Emphysema (ICD10-J43.9). Electronically Signed   By: Kimberley Penman M.D.   On: 01/15/2024 08:37     Signature  -   Lynnwood Sauer M.D on 01/16/2024 at 10:01 AM   -  To page go to [email or web address]

## 2024-01-16 NOTE — Progress Notes (Signed)
 Physical Therapy Treatment Patient Details Name: Isaac Stokes MRN: [id number] DOB: [birth year] Today's Date: 01/16/2024   History of Present Illness 73 y.o. male presented to hospital 5/15 with generalized weakness decreased oral intake, hypotensive, found to have severe sepsis secondary to UTI. PMH: infrarenal abdominal aortic aneurysm, prior CVA, chronic hypotension on midodrine , BPH, chronic HFpEF, current smoker.    PT Comments  Good progress towards acute functional goals, updated as appropriate. Brother reports patient's cognition still off by about 50% from baseline. Pt able to stand with CGA and RW to steady himself. Progressed from min assist initially with gait for impaired balance, to CGA with practice throughout session, and RW for support at all times. Needs several cues throughout to safely mobilize and perform tasks. Patient will benefit from continued inpatient follow up therapy, <3 hours/day. Patient will continue to benefit from skilled physical therapy services to further improve independence with functional mobility.     If plan is discharge home, recommend the following: A little help with walking and/or transfers;A little help with bathing/dressing/bathroom;Supervision due to cognitive status;Assist for transportation;Assistance with cooking/housework   Can travel by private vehicle     Yes  Equipment Recommendations  None recommended by PT    Recommendations for Other Services       Precautions / Restrictions Precautions Precautions: Fall Recall of Precautions/Restrictions: Impaired Restrictions Weight Bearing Restrictions Per Provider Order: No     Mobility  Bed Mobility Overal bed mobility: Needs Assistance Bed Mobility: Supine to Sit     Supine to sit: Supervision     General bed mobility comments: Supervision for safety, requires cues to facilitate, a little delayed, no physical assist needed today.    Transfers Overall transfer level: Needs  assistance Equipment used: Rolling walker (2 wheels) Transfers: Sit to/from Stand Sit to Stand: Contact guard assist           General transfer comment: CGA for safety from bed and toilet with use of rail. RW to to steady himself upon rising. Cues for technique and safety.    Ambulation/Gait Ambulation/Gait assistance: Min assist Gait Distance (Feet): 75 Feet (+15) Assistive device: Rolling walker (2 wheels) Gait Pattern/deviations: Step-through pattern, Decreased stride length, Staggering left, Leaning posteriorly Gait velocity: dec Gait velocity interpretation: <1.8 ft/sec, indicate of risk for recurrent falls   General Gait Details: Initially with min assist for balance, improved to CGA in hallway, cues for safety, awareness, and directions back to room. No buckling noted. Fair control of RW. Some instability with turns.   Stairs             Wheelchair Mobility     Tilt Bed    Modified Rankin (Stroke Patients Only)       Balance Overall balance assessment: Needs assistance Sitting-balance support: No upper extremity supported, Feet supported Sitting balance-Leahy Scale: Good     Standing balance support: Single extremity supported, During functional activity Standing balance-Leahy Scale: Poor                              Communication Communication Communication: No apparent difficulties  Cognition Arousal: Alert Behavior During Therapy: WFL for tasks assessed/performed   PT - Cognitive impairments: Orientation, Memory, Awareness, Safety/Judgement, Problem solving                       PT - Cognition Comments: Brother states pt cognition approximately 50% back to baseline. Following commands: Impaired  Following commands impaired: Follows one step commands inconsistently, Follows one step commands with increased time    Cueing Cueing Techniques: Verbal cues, Gestural cues, Tactile cues  Exercises      General Comments General  comments (skin integrity, edema, etc.): VSS. Brother present, supportive. Concerned pt cannot safely care for himself at home      Pertinent Vitals/Pain Pain Assessment Pain Assessment: No/denies pain    Home Living                          Prior Function            PT Goals (current goals can now be found in the care plan section) Acute Rehab PT Goals Patient Stated Goal: be able to care for himself again before returning home PT Goal Formulation: With patient Time For Goal Achievement: 01/27/24 Potential to Achieve Goals: Fair Progress towards PT goals: Progressing toward goals    Frequency    Min 2X/week      PT Plan      Co-evaluation              AM-PAC PT "6 Clicks" Mobility   Outcome Measure  Help needed turning from your back to your side while in a flat bed without using bedrails?: None Help needed moving from lying on your back to sitting on the side of a flat bed without using bedrails?: None Help needed moving to and from a bed to a chair (including a wheelchair)?: A Little Help needed standing up from a chair using your arms (e.g., wheelchair or bedside chair)?: A Little Help needed to walk in hospital room?: A Little Help needed climbing 3-5 steps with a railing? : Total 6 Click Score: 18    End of Session Equipment Utilized During Treatment: Gait belt Activity Tolerance: Patient tolerated treatment well Patient left: with bed alarm set;with call bell/phone within reach;in bed Nurse Communication: Mobility status PT Visit Diagnosis: Unsteadiness on feet (R26.81);Muscle weakness (generalized) (M62.81);Difficulty in walking, not elsewhere classified (R26.2);Other symptoms and signs involving the nervous system (R29.898)     Time: 1610-9604 PT Time Calculation (min) (ACUTE ONLY): 24 min  Charges:    $Gait Training: 8-22 mins $Therapeutic Activity: 8-22 mins PT General Charges $$ ACUTE PT VISIT: 1 Visit                      Jory Ng, PT, DPT [facility]  Rehabilitation Services Physical Therapist Office: [phone] Website: [email or web address]    Alinda Irani 01/16/2024, 4:02 PM

## 2024-01-16 NOTE — Progress Notes (Signed)
 [facility] ROUNDING NOTE   Subjective: Patient feels 'great' and wants to go home.  Wondering if he can go outside and smoke.  Denies abdominal pain, nausea, vomiting.  Non bloody bowel movements. Upper GI series did not reveal any findings concerning for large ulcer, diverticulum or mass lesion   Objective: Vital signs in last 24 hours: Temp:  [97.3 F (36.3 C)-98.4 F (36.9 C)] 97.6 F (36.4 C) (05/20 1608) Pulse Rate:  [60-66] 66 (05/20 1608) Resp:  [12-28] 16 (05/20 1608) BP: (129-148)/(75-91) 143/82 (05/20 1608) SpO2:  [97 %-100 %] 98 % (05/20 1608) Weight:  [65.5 kg] 65.5 kg (05/20 0500) Last BM Date :  (PTA) General: NAD,  frail elderly Caucasian male Lungs:  CTA b/l, no w/r/r Heart:  RRR, no m/r/g Abdomen:  Soft, NT, ND, +BS Ext:  No c/c/e    Intake/Output from previous day: 05/19 0701 - 05/20 0700 In: -  Out: 2700 [Urine:2700] Intake/Output this shift: No intake/output data recorded.   Lab Results: Recent Labs    01/14/24 0705 01/15/24 0449 01/16/24 0452  WBC 17.3* 9.9 7.2  HGB 8.8* 7.7* 8.1*  PLT 115* 95* 103*  MCV 91.6 92.2 91.8   BMET Recent Labs    01/14/24 0705 01/15/24 0449 01/16/24 0452  NA 139 137 134*  K 4.2 4.0 4.1  CL 112* 111 106  CO2 19* 19* 20*  GLUCOSE 96 76 83  BUN 52* 46* 34*  CREATININE 2.43* 2.11* 1.90*  CALCIUM  8.3* 8.3* 8.4*   LFT Recent Labs    01/14/24 0705 01/15/24 0449 01/16/24 0452  PROT 5.5* 4.7* 5.1*  ALBUMIN  2.1* 1.9* 2.0*  AST 43* 28 21  ALT 37 29 18  ALKPHOS 82 66 57  BILITOT 0.5 0.4 0.2   PT/INR No results for input(s): "INR" in the last 72 hours.    Imaging/Other results: DG UGI W SINGLE CM (SOL OR THIN BA) Result Date: 01/16/2024 CLINICAL DATA:  73 year old with epigastric pain. Recent CT raised concern for wall thickening involving the pylorus and descending duodenum. Concern for a duodenal diverticulum versus ulcer. EXAM: DG UGI W SINGLE CM TECHNIQUE: Scout radiograph was  obtained. Single contrast examination was performed using water-soluble contrast. This exam was performed by Elene Griffes, MD. FLUOROSCOPY: Radiation Exposure Index (as provided by the fluoroscopic device): 153.10 mGy Kerma COMPARISON:  CT chest, abdomen pelvis 01/15/2024 FINDINGS: Scout Radiograph: Nonobstructive bowel gas pattern. Gas-filled structure in the region of the duodenal bulb or distal stomach. Levoscoliosis in the lumbar spine. Esophagus: Normal appearance. No mucosal lesions or strictures visualized. Esophageal motility:  Within normal limits. Stomach: Normal appearance. Limited evaluation on this single contrast examination. Gastric emptying: Normal emptying. Duodenum: Normal appearance of the duodenum. There appeared to intermittent dilatation of the duodenal bulb but no focal diverticulum. No gross abnormality to suggest a large ulcer. Contrast moved through the duodenum into the jejunum. No focal narrowing in the duodenum. Other:  None. IMPRESSION: 1. Normal appearance of the duodenum. No evidence for a large duodenal diverticulum. No gross duodenal abnormality. If there is clinical concern in this area, consider further evaluation with endoscopy or CT with IV contrast when the patient's renal function is appropriate. 2. No gross abnormality to the esophagus or stomach. Electronically Signed   By: Elene Griffes M.D.   On: 01/16/2024 15:54   CT CHEST ABDOMEN PELVIS WO CONTRAST Result Date: 01/15/2024 CLINICAL DATA:  Sepsis.  Hypotension and weakness. EXAM: CT CHEST, ABDOMEN AND PELVIS WITHOUT CONTRAST TECHNIQUE: Multidetector CT  imaging of the chest, abdomen and pelvis was performed following the standard protocol without IV contrast. RADIATION DOSE REDUCTION: This exam was performed according to the departmental dose-optimization program which includes automated exposure control, adjustment of the mA and/or kV according to patient size and/or use of iterative reconstruction technique. COMPARISON:   12/12/2022 FINDINGS: CT CHEST FINDINGS Cardiovascular: The heart size appears normal. Aortic atherosclerosis. Coronary artery calcifications. No pericardial effusion. Mediastinum/Nodes: Thyroid gland, trachea, and esophagus appear normal. No enlarged mediastinal or hilar lymph nodes. Lungs/Pleura: Small bilateral pleural effusions with overlying areas of platelike atelectasis and volume loss in the lower lobes. Emphysema with diffuse bronchial wall thickening. Scattered patchy areas of ground-glass attenuation noted within bilateral upper lobes, and right middle lobe. No consolidative change identified. Thickening along the posterior aspect of the major fissure noted. Musculoskeletal: No chest wall mass or suspicious bone lesions identified. CT ABDOMEN PELVIS FINDINGS Hepatobiliary: No focal liver abnormality. Gallbladder is decompressed. No bile duct dilatation. Pancreas: Unremarkable. No pancreatic ductal dilatation or surrounding inflammatory changes. Spleen: Normal in size without focal abnormality. Adrenals/Urinary Tract: Normal adrenal glands. No nephrolithiasis or hydronephrosis identified. There is a small amount of gas identified within the bladder. No bladder calculi or focal lesion noted. Stomach/Bowel: Assessment for bowel pathology is limited due to lack of IV contrast material. There is wall thickening involving the pylorus and descending duodenum with surrounding hazy soft tissue edema this is structure within the right upper quadrant of the abdomen adjacent to the proximal duodenum/pylorus has an air-fluid level, image 75/3. This may represent a duodenal diverticulum or ulcer. This measures approximately 4.4 x 4.0 cm, image 74/3. Again this is difficult to further characterize due to lack of IV an oral contrast. The jejunum and remaining small bowel loops have a normal caliber without signs of wall thickening or inflammation. No pathologic dilatation of the colon. Moderate retained stool noted within  the ascending colon and transverse colon. Vascular/Lymphatic: Extensive aortic atherosclerosis. Infrarenal abdominal aortic aneurysm is identified. This has a maximum AP dimension of 7.8 cm, image 88/3. On the previous exam using the same measurements scheme as today this had a maximum AP dimension of 7.0 cm. No signs abdominopelvic adenopathy. Reproductive: Prostate is unremarkable. Other: There is a small volume of abdominopelvic ascites. No discrete fluid collections. No signs of pneumoperitoneum. Musculoskeletal: Thoracolumbar scoliosis with multilevel degenerative disc disease. No acute or suspicious osseous findings. IMPRESSION: 1. There is wall thickening involving the pylorus and descending duodenum with surrounding hazy soft tissue edema. Findings are concerning for with acute duodenitis. There is a structure within the right upper quadrant of the abdomen adjacent to the proximal duodenum/pylorus has an air-fluid level. This may represent a duodenal diverticulum or ulcer. This measures approximately 4.4 x 4.0 cm. These changes are suboptimally visualized due to lack of IV an oral contrast. Consider further evaluation with upper endoscopy. 2. Small bilateral pleural effusions with overlying areas of platelike atelectasis and volume loss in the lower lobes. 3. Scattered patchy areas of ground-glass attenuation noted within bilateral upper lobes, and right middle lobe. Findings are nonspecific and may reflect atypical infection or inflammation. 4. Small volume of abdominopelvic ascites. 5. Infrarenal abdominal aortic aneurysm is identified. This has a maximum AP dimension of 7.8 cm. On the previous exam using the same measurements scheme as today this had a maximum AP dimension of 7.0 cm. Recommend referral to or continued care with vascular specialist. (Ref.: J Vasc Surg. 2018; 67:2-77 and J Am Coll Radiol 2013;10(10):789-794.) 6. Small  amount of gas identified within the bladder. Correlate for any recent  instrumentation. 7. Coronary artery calcifications. 8. Aortic Atherosclerosis (ICD10-I70.0) and Emphysema (ICD10-J43.9). Electronically Signed   By: Kimberley Penman M.D.   On: 01/15/2024 08:37      Assessment and Plan:  73 year old male with large AAA, not deemed to be a surgical candidate, admitted with urosepsis/E. Coli bacteremia. CT abdomen/pelvis without contrast showed suspected wall thickening of the pylorus/duodenum with soft tissue edema and a 4cm structure with air-fluid level in the duodenum. Differential includes ulcer vs diverticulum.  Subsequent upper GI series did not show any concerning findings.  Patient does not have a clinical history to suggest large ulcer, and is high risk for sedation. Based on this, will not pursue endoscopic evaluation or further evaluation of nonspecific CT findings. Can consider a repeat CT with PO/IV contrast as outpatient.  GI will sign off at this time.      Elois Hair, MD  01/16/2024, 7:54 PM [facility]

## 2024-01-16 NOTE — Progress Notes (Signed)
 [facility] [phone] [facility] Liaison Note?   [name] is a current [facility] patient with a terminal diagnosis of cerebral vascular disease. He had several days of worsening weakness and altered mental status and decision was made for him to be assessed in the ED. He was subsequently admitted on 5.14 with a diagnosis of Sepsis related to UTI. Per Dr. Tessie Fila with [facility] this is a related hospital admission.  [name] remains on antibiotics for treatment of Sepsis. Therapy has worked with him and is recommending rehab. He remains forgetful and not able to make his own decisions. His brother had indicated that he believe the patient would benefit from rehab and is amenable to revoking his hospice service. He however is have transportation issues and would not be able to sign electronically. He requests that we reach out to his daughter in law to complete the revocation process.  He remains inpatient appropriate with the need for IVAB. Vital Signs: 98.2/100/12    134/86   spO2 97% room air I/O:  240/700 Abnormal labs: BUN 52, Creatinine 2.43, Ca+ 8.3, Albumin  2.1, GFR 27 Diagnostics: None new  IV/PRN Meds: Rocephin  2g q8H Problem list as per progress note, Dr. Lynnwood Sauer 5.19.25  Severe sepsis secondary to UTI, POA E. coli bacteremia. Follow urine culture and sensitivity including blood cultures.  History of BPH on Flomax , stable renal ultrasound, with empiric IV Rocephin  improving, continue to monitor, sepsis pathophysiology much improved.   Infrarenal abdominal aortic aneurysm measuring 6.6 cm now 7 cms Seen by vascular surgery outpatient. Declined surgical intervention, with home hospice.  Discussed with brother, DNR, continue medical treatment.  No heroics.   Nonspecific upper abdominal discomfort.  CT suggestive of possible advanced duodenal ulcer or inflammation, GI consulted may require EGD.  PPI for now.     AKI on CKD 3B, suspect prerenal in the  setting of poor oral intake Presented with BUN 64 and creatinine of 3.84. Baseline creatinine 2.19 with GFR of 31.  Proved after hydration and packed RBC transfusion monitor.Isaac Stokes  Discharge Planning:? Ongoing, likely revocation for SNF Family contact: Talked with brother by phone IDT:? Updated?   Goals of Care: DNR Transfer/Med list submitted to medical records to be entered under Media tab. Please call with any hospice related questions or concerns. Isaac Stokes, [facility] hospital liaison [phone]

## 2024-01-17 DIAGNOSIS — R6520 Severe sepsis without septic shock: Secondary | ICD-10-CM | POA: Diagnosis not present

## 2024-01-17 DIAGNOSIS — A419 Sepsis, unspecified organism: Secondary | ICD-10-CM | POA: Diagnosis not present

## 2024-01-17 LAB — CBC WITH DIFFERENTIAL/PLATELET
Abs Immature Granulocytes: 0 10*3/uL (ref 0.00–0.07)
Basophils Absolute: 0.2 10*3/uL — ABNORMAL HIGH (ref 0.0–0.1)
Basophils Relative: 2 %
Eosinophils Absolute: 0.4 10*3/uL (ref 0.0–0.5)
Eosinophils Relative: 5 %
HCT: 25.3 % — ABNORMAL LOW (ref 39.0–52.0)
Hemoglobin: 8.4 g/dL — ABNORMAL LOW (ref 13.0–17.0)
Lymphocytes Relative: 22 %
Lymphs Abs: 1.9 10*3/uL (ref 0.7–4.0)
MCH: 30.7 pg (ref 26.0–34.0)
MCHC: 33.2 g/dL (ref 30.0–36.0)
MCV: 92.3 fL (ref 80.0–100.0)
Monocytes Absolute: 0.9 10*3/uL (ref 0.1–1.0)
Monocytes Relative: 10 %
Neutro Abs: 5.2 10*3/uL (ref 1.7–7.7)
Neutrophils Relative %: 61 %
Platelets: 149 10*3/uL — ABNORMAL LOW (ref 150–400)
RBC: 2.74 MIL/uL — ABNORMAL LOW (ref 4.22–5.81)
RDW: 15.5 % (ref 11.5–15.5)
WBC: 8.5 10*3/uL (ref 4.0–10.5)
nRBC: 0 % (ref 0.0–0.2)
nRBC: 0 /100{WBCs}

## 2024-01-17 LAB — COMPREHENSIVE METABOLIC PANEL WITH GFR
ALT: 12 U/L (ref 0–44)
AST: 22 U/L (ref 15–41)
Albumin: 2.1 g/dL — ABNORMAL LOW (ref 3.5–5.0)
Alkaline Phosphatase: 55 U/L (ref 38–126)
Anion gap: 5 (ref 5–15)
BUN: 30 mg/dL — ABNORMAL HIGH (ref 8–23)
CO2: 22 mmol/L (ref 22–32)
Calcium: 8.2 mg/dL — ABNORMAL LOW (ref 8.9–10.3)
Chloride: 104 mmol/L (ref 98–111)
Creatinine, Ser: 1.83 mg/dL — ABNORMAL HIGH (ref 0.61–1.24)
GFR, Estimated: 38 mL/min — ABNORMAL LOW (ref >60)
Glucose, Bld: 87 mg/dL (ref 70–99)
Potassium: 4.4 mmol/L (ref 3.5–5.1)
Sodium: 131 mmol/L — ABNORMAL LOW (ref 135–145)
Total Bilirubin: 0.5 mg/dL (ref 0.0–1.2)
Total Protein: 5.3 g/dL — ABNORMAL LOW (ref 6.5–8.1)

## 2024-01-17 LAB — PROCALCITONIN: Procalcitonin: 27.81 ng/mL

## 2024-01-17 LAB — GLUCOSE, CAPILLARY: Glucose-Capillary: 80 mg/dL (ref 70–99)

## 2024-01-17 LAB — MAGNESIUM: Magnesium: 1.8 mg/dL (ref 1.7–2.4)

## 2024-01-17 LAB — C-REACTIVE PROTEIN: CRP: 2 mg/dL — ABNORMAL HIGH (ref <1.0)

## 2024-01-17 MED ORDER — OXYCODONE HCL 5 MG PO TABS
5.0000 mg | ORAL_TABLET | Freq: Four times a day (QID) | ORAL | Status: AC | PRN
  Administered 2024-01-17 (×2): 5 mg via ORAL
  Filled 2024-01-17 (×2): qty 1

## 2024-01-17 MED ORDER — SODIUM CHLORIDE 0.9 % IV SOLN
2.0000 g | INTRAVENOUS | Status: DC
  Administered 2024-01-17 – 2024-01-18 (×2): 2 g via INTRAVENOUS
  Filled 2024-01-17 (×2): qty 20

## 2024-01-17 NOTE — Progress Notes (Signed)
 Physical Therapy Treatment Patient Details Name: Isaac Stokes MRN: [id number] DOB: 03/01/1951 Today's Date: 01/17/2024   History of Present Illness 73 y.o. male presented to hospital 5/15 with generalized weakness decreased oral intake, hypotensive, found to have severe sepsis secondary to UTI. PMH: infrarenal abdominal aortic aneurysm, prior CVA, chronic hypotension on midodrine , BPH, chronic HFpEF, current smoker.    PT Comments  Pt seen for PT tx with pt agreeable. Pt pleasant throughout session, slightly anxious, but showing improving insight re: safety with mobility. Pt is able to complete bed mobility with mod I, sit<>stand with supervision with cuing re: hand placement, & ambulate around nurses station x 2 with RW & supervision with impaired gait pattern as noted below. Pt performed 5x sit<>stand from recliner without BUE support & min assist with focus on BLE strengthening & generalized endurance training. Pt is making good progress with mobility, very eager to return home.    If plan is discharge home, recommend the following: A little help with walking and/or transfers;A little help with bathing/dressing/bathroom;Supervision due to cognitive status;Assist for transportation;Assistance with cooking/housework   Can travel by private vehicle     Yes  Equipment Recommendations  None recommended by PT    Recommendations for Other Services       Precautions / Restrictions Precautions Precautions: Fall Restrictions Weight Bearing Restrictions Per Provider Order: No     Mobility  Bed Mobility Overal bed mobility: Needs Assistance Bed Mobility: Supine to Sit     Supine to sit: Modified independent (Device/Increase time), HOB elevated, Used rails (exit L side of bed, use of bed rails)          Transfers Overall transfer level: Needs assistance Equipment used: Rolling walker (2 wheels) Transfers: Sit to/from Stand Sit to Stand: Supervision           General  transfer comment: education re: hand placement during sit<>stand from EOB with RW    Ambulation/Gait Ambulation/Gait assistance: Supervision Gait Distance (Feet): 200 Feet Assistive device: Rolling walker (2 wheels) Gait Pattern/deviations: Decreased step length - left, Decreased step length - right, Decreased stride length Gait velocity: slightly decreased     General Gait Details: decreased step length LLE, decreased heel strike LLE, no overt LOB. Pt showing good awareness, reporting he should not look around as that could cause him to lose his balance & fall.   Stairs             Wheelchair Mobility     Tilt Bed    Modified Rankin (Stroke Patients Only)       Balance Overall balance assessment: Needs assistance   Sitting balance-Leahy Scale: Good     Standing balance support: Bilateral upper extremity supported, Reliant on assistive device for balance, During functional activity Standing balance-Leahy Scale: Fair                              Hotel manager: No apparent difficulties  Cognition Arousal: Alert Behavior During Therapy: WFL for tasks assessed/performed   PT - Cognitive impairments: Difficult to assess                       PT - Cognition Comments: Oriented to place, time, able to [facility] male purewick without assistance, does require cuing re: proper hand placement to push to standing. Eager to go outside & smoke but declining asking for nicotine  patch. Slightly anxious.   Following commands impaired:  Follows one step commands with increased time, Only follows one step commands consistently    Cueing Cueing Techniques: Verbal cues, Gestural cues, Tactile cues  Exercises Other Exercises Other Exercises: Pt performed 5x sit<>stand from recliner without BUE support with min assist with focus on BLE strengthening & overall endurance training.    General Comments        Pertinent Vitals/Pain Pain  Assessment Pain Assessment: No/denies pain    Home Living                          Prior Function            PT Goals (current goals can now be found in the care plan section) Acute Rehab PT Goals Patient Stated Goal: be able to care for himself again before returning home PT Goal Formulation: With patient Time For Goal Achievement: 01/27/24 Potential to Achieve Goals: Good Progress towards PT goals: Progressing toward goals    Frequency    Min 2X/week      PT Plan      Co-evaluation              AM-PAC PT "6 Clicks" Mobility   Outcome Measure  Help needed turning from your back to your side while in a flat bed without using bedrails?: None Help needed moving from lying on your back to sitting on the side of a flat bed without using bedrails?: None Help needed moving to and from a bed to a chair (including a wheelchair)?: A Little Help needed standing up from a chair using your arms (e.g., wheelchair or bedside chair)?: A Little Help needed to walk in hospital room?: A Little Help needed climbing 3-5 steps with a railing? : A Little 6 Click Score: 20    End of Session   Activity Tolerance: Patient tolerated treatment well Patient left: in chair;with chair alarm set;with call bell/phone within reach Nurse Communication: Mobility status PT Visit Diagnosis: Unsteadiness on feet (R26.81);Muscle weakness (generalized) (M62.81);Difficulty in walking, not elsewhere classified (R26.2);Other symptoms and signs involving the nervous system (R29.898)     Time: 1610-9604 PT Time Calculation (min) (ACUTE ONLY): 24 min  Charges:    $Therapeutic Activity: 23-37 mins PT General Charges $$ ACUTE PT VISIT: 1 Visit                     [name], PT, DPT 01/17/24, 2:38 PM   [name] 01/17/2024, 2:36 PM

## 2024-01-17 NOTE — Progress Notes (Signed)
 [facility] [phone] [facility] Liaison Note?    [name] is a current [facility] patient with a terminal diagnosis of cerebral vascular disease. He had several days of worsening weakness and altered mental status and decision was made for him to be assessed in the ED. He was subsequently admitted on 5.14 with a diagnosis of Sepsis related to UTI. Per Dr. Tessie Fila with [facility] this is a related hospital admission.   [name] remains on antibiotics for treatment of Sepsis. Therapy has worked with him and is recommending rehab. He remains forgetful and not able to make his own decisions. Patient had additional evaluation by GI and they found no acute issues. Family continues to verbalize desire for patient to revoke hospice and go to rehab but they have not been able to complete the revocation forms yet.  He remains inpatient appropriate with the need for IVAB.  Vital Signs: 97.3/65/22   148/85    spO2 98% room air I/O:  560/not recorded Abnormal labs: Na+ 134, BUN 34, Creatinine 1.90, Ca+ 8.4, Albumin  2, GFR 37 Diagnostics:  DG UGI W SINGLE CM IMPRESSION: 1. Normal appearance of the duodenum. No evidence for a large duodenal diverticulum. No gross duodenal abnormality. If there is clinical concern in this area, consider further evaluation with endoscopy or CT with IV contrast when the patient's renal function is appropriate. 2. No gross abnormality to the esophagus or stomach. Electronically Signed   By: Elene Griffes M.D.   On: 01/16/2024 15:54  IV/PRN Meds: Rocephin  2g q8H Problem list as per progress note, Dr. Lynnwood Sauer 5.20.25 Severe sepsis secondary to UTI, POA E. coli bacteremia. Follow urine culture and sensitivity including blood cultures.  History of BPH on Flomax , stable renal ultrasound, with empiric IV Rocephin  improving, continue to monitor, sepsis pathophysiology much improved.  Repeat blood cultures on 01/16/2024.   Infrarenal abdominal  aortic aneurysm measuring 6.6 cm now 7 cms Seen by vascular surgery outpatient. Declined surgical intervention, with home hospice.  Discussed with brother, DNR, continue medical treatment.  No heroics.   Nonspecific upper abdominal discomfort.  CT suggestive of possible advanced duodenal ulcer or inflammation, GI consulted may require EGD, GI pursuing barium swallow first on 01/16/2024 follow.  PPI for now.     AKI on CKD 3B, suspect prerenal in the setting of poor oral intake Presented with BUN 64 and creatinine of 3.84. Baseline creatinine 2.19 with GFR of 31.  Proved after hydration and packed RBC transfusion monitor.Isaac Stokes   Discharge Planning:? Ongoing, likely revocation for SNF Family contact: Communication from homecare SW IDT:? Updated?   Goals of Care: DNR Transfer/Med list submitted to medical records to be entered under Media tab. Please call with any hospice related questions or concerns. Dwane Gitelman, BSN RN Hospice hospital liaison [phone]

## 2024-01-17 NOTE — TOC Progression Note (Addendum)
 Transition of Care [facility]) - Progression Note    Patient Details  Name: RIK WADEL MRN: [id number] Date of Birth: 1950-10-15  [facility] CM/SW Contact  Jannice Mends, LCSW Phone Number: 01/17/2024, 9:46 AM  Clinical Narrative:    CSW spoke with patient's brother and provided SNF offers and ratings. He has selected [facility]. Brother stated CSW could contact either of his sons Royston Cornea or Autry Legions if we have trouble reaching him. CSW notified [facility] of choice. Hospice completed revocation forms with brother.      Barriers to Discharge: Continued Medical Work up  Expected Discharge Plan and Services In-house Referral: Clinical Social Work     Living arrangements for the past 2 months: Apartment                                       Social Determinants of Health (SDOH) Interventions SDOH Screenings   Food Insecurity: Patient Unable To Answer (01/12/2024)  Housing: Patient Unable To Answer (01/12/2024)  Transportation Needs: Patient Unable To Answer (01/12/2024)  Utilities: Patient Unable To Answer (01/12/2024)  Social Connections: Patient Unable To Answer (01/12/2024)  Tobacco Use: High Risk (01/11/2024)    Readmission Risk Interventions     No data to display

## 2024-01-17 NOTE — Progress Notes (Signed)
 PROGRESS NOTE                                                                                                                                                                                                             Patient Demographics:    Isaac Stokes, is a 73 y.o. male, DOB - 03-Jun-1951, VHQ:[id number]  Outpatient Primary MD for the patient is [facility]    LOS - 6  Admit date - 01/11/2024    Chief Complaint  Patient presents with   Hypotension   Weakness       Brief Narrative (HPI from H&P)   73 y.o. male with medical history significant for infrarenal abdominal aortic aneurysm, prior CVA, chronic hypotension on midodrine , BPH, chronic HFpEF, current smoker, presented to hospital with generalized weakness decreased oral intake.  Hospice nurse checked on the patient and was noted to be hypotensive so EMS was called in.  Systolic blood pressure was noted to be in the 60s and patient received normal saline bolus and was brought into the hospital.  In the ED code sepsis was activated.  He was found to have E. coli bacteremia with UTI, urine likely being the source.   Subjective:   Patient in bed, appears comfortable, denies any headache, no fever, no chest pain or pressure, no shortness of breath , no abdominal pain. No focal weakness.   Assessment  & Plan :    Severe sepsis secondary to UTI, POA E. coli bacteremia.  Follow urine culture and sensitivity including blood cultures, of note initial blood cultures growing 2 different gram-negative bacteria.  History of BPH on Flomax , stable renal ultrasound, with empiric IV Rocephin  improving, continue to monitor, sepsis pathophysiology much improved.  Repeat blood cultures on 01/16/2024 so far negative, UC inconclusive.  Will get ID input.  Per GI no further GI workup needed.   Infrarenal abdominal aortic aneurysm measuring 6.6 cm now 7 cms Seen by vascular  surgery outpatient. Declined surgical intervention, with home hospice.  Discussed with brother, DNR, continue medical treatment.  No heroics.   Nonspecific upper abdominal discomfort.  CT suggestive of possible advanced duodenal ulcer or inflammation, GI consulted may require EGD, GI requested a barium swallow which was unremarkable, per GI no further workup needed.  PPI for now.    AKI on CKD 3B, suspect  prerenal in the setting of poor oral intake Presented with BUN 64 and creatinine of 3.84. Baseline creatinine 2.19 with GFR of 31.  Proved after hydration and packed RBC transfusion monitor..   Mild hyponatremia.  SIADH.  Monitor.    Non-anion gap metabolic acidosis in the setting of lactic acidosis Received IV fluids and antibiotics.  Will discontinue IV fluids.  Encourage oral hydration.   Elevated troponin, suspect demand ischemia in the setting of severe sepsis Troponin 48, repeat 34, non-ACS pattern, likely due to mild demand mismatch from symptomatic anemia.  No chest pain.  No further workup.   Symptomatic anemia- Anemia of chronic disease in the setting of CKD 3B Status post 1 unit of packed RBC.  Hemoglobin today at 7.8.  Continue to monitor no signs of ongoing bleeding.   Left lower extremity edema and pain, rule out DVT Supportive care much improved, ultrasound negative for DVT.   Thrombocytopenia Platelet count of 73.  Continue to monitor.  Check CBC in AM.   BPH Resume home tamsulosin .   Closely monitor for urinary retention.  Continue intake and output charting.   Chronic HFpEF Euvolemic at this time.  Continue intake and output charting.   Tobacco use disorder Current smoker.  Continue Nicotine  patch   Chronic hypotension On midodrine  10 mg prior to admission.  Continue midodrine    Prior CVA Resume home regimen Fall precautions        Condition - Extremely Guarded  Family Communication  : Discussed with brother [phone]  over the phone on 05/15/2024,  DNR, continue gentle medical treatment, may be open to SNF with palliative care  Code Status :  DNR  Consults  :  GI  PUD Prophylaxis : PPI   Procedures  :     Barium swallow  01/16/2024.  Non acute  CT chest -abdominal pelvis.    1. There is wall thickening involving the pylorus and descending duodenum with surrounding hazy soft tissue edema. Findings are concerning for with acute duodenitis. There is a structure within the right upper quadrant of the abdomen adjacent to the proximal duodenum/pylorus has an air-fluid level. This may represent a duodenal diverticulum or ulcer. This measures approximately 4.4 x 4.0 cm. These changes are suboptimally visualized due to lack of IV an oral contrast. Consider further evaluation with upper endoscopy. 2. Small bilateral pleural effusions with overlying areas of platelike atelectasis and volume loss in the lower lobes. 3. Scattered patchy areas of ground-glass attenuation noted within bilateral upper lobes, and right middle lobe. Findings are nonspecific and may reflect atypical infection or inflammation. 4. Small volume of abdominopelvic ascites. 5. Infrarenal abdominal aortic aneurysm is identified. This has a maximum AP dimension of 7.8 cm. On the previous exam using the same measurements scheme as today this had a maximum AP dimension of 7.0 cm. Recommend referral to or continued care with vascular specialist. (Ref.: J Vasc Surg. 2018; 67:2-77 and J Am Coll Radiol 2013;10(10):789-794.) 6. Small amount of gas identified within the bladder. Correlate for any recent instrumentation. 7. Coronary artery calcifications. 8. Aortic Atherosclerosis (ICD10-I70.0) and Emphysema (ICD10-J43.9).  Renal US  - Non acute  Bilateral lower extremity venous duplex.  No DVT.      Disposition Plan  :    Status is: Inpatient  DVT Prophylaxis  :  Heparin     Lab Results  Component Value Date   PLT 149 (L) 01/17/2024    Diet :  Diet Order  Diet Heart  Fluid consistency: Thin  Diet effective now                    Inpatient Medications  Scheduled Meds:  sodium chloride    Intravenous Once   escitalopram   5 mg Oral Daily   heparin  injection (subcutaneous)  5,000 Units Subcutaneous Q8H   midodrine   5 mg Oral TID WC   nicotine   7 mg Transdermal Daily   pantoprazole   40 mg Oral Daily   tamsulosin   0.4 mg Oral Daily   Continuous Infusions:  cefTRIAXone  (ROCEPHIN )  IV 2 g (01/17/24 0905)   PRN Meds:.acetaminophen , alum & mag hydroxide-simeth, bisacodyl , ipratropium-albuterol , LORazepam , melatonin, oxyCODONE , polyethylene glycol, prochlorperazine     Objective:   Vitals:   01/17/24 0727 01/17/24 0731 01/17/24 0800 01/17/24 0900  BP: (!) 143/90  134/82 98/71  Pulse:  60 66 70  Resp:  (!) 27 (!) 22 20  Temp:  (!) 97.2 F (36.2 C)    TempSrc:  Oral    SpO2:   93% 99%  Weight:        Wt Readings from Last 3 Encounters:  01/17/24 69.7 kg  11/06/23 63 kg  12/12/22 63 kg     Intake/Output Summary (Last 24 hours) at 01/17/2024 1019 Last data filed at 01/17/2024 0517 Gross per 24 hour  Intake 567.83 ml  Output --  Net 567.83 ml     Physical Exam  Awake mildly confused, No new F.N deficits, Normal affect [place].AT,PERRAL Supple Neck, No JVD,   Symmetrical Chest wall movement, Good air movement bilaterally, CTAB RRR,No Gallops,Rubs or new Murmurs,  +ve B.Sounds, Abd Soft, No tenderness,   No Cyanosis, Clubbing or edema       Data Review:    Recent Labs  Lab 01/13/24 0432 01/14/24 0705 01/15/24 0449 01/16/24 0452 01/17/24 0419  WBC 16.9* 17.3* 9.9 7.2 8.5  HGB 8.2* 8.8* 7.7* 8.1* 8.4*  HCT 24.1* 26.3* 23.5* 24.5* 25.3*  PLT 103* 115* 95* 103* 149*  MCV 92.0 91.6 92.2 91.8 92.3  MCH 31.3 30.7 30.2 30.3 30.7  MCHC 34.0 33.5 32.8 33.1 33.2  RDW 16.4* 16.1* 15.9* 15.6* 15.5  LYMPHSABS 0.5* 1.1 1.7 1.6 1.9  MONOABS 0.3 0.7 0.6 0.8 0.9  EOSABS 0.2 0.2 0.4 0.6* 0.4  BASOSABS 0.2* 0.1 0.1 0.1 0.2*    Recent  Labs  Lab 01/11/24 1647 01/11/24 1647 01/11/24 1710 01/11/24 1851 01/12/24 0549 01/12/24 1507 01/13/24 0432 01/14/24 0705 01/15/24 0449 01/16/24 0452 01/17/24 0419  NA 135  --   --   --  136  --  137 139 137 134* 131*  K 5.1  --   --   --  5.0  --  4.3 4.2 4.0 4.1 4.4  CL 107  --   --   --  110  --  113* 112* 111 106 104  CO2 17*  --   --   --  15*  --  16* 19* 19* 20* 22  ANIONGAP 11  --   --   --  11  --  8 8 7 8 5   GLUCOSE 80  --   --   --  61*  --  63* 96 76 83 87  BUN 64*  --   --   --  64*  --  64* 52* 46* 34* 30*  CREATININE 3.84*  --   --   --  3.34*  --  2.85* 2.43* 2.11* 1.90* 1.83*  AST 30  --   --   --  38  --   --  43* 28 21 22   ALT 15  --   --   --  19  --   --  37 29 18 12   ALKPHOS 42  --   --   --  54  --   --  82 66 57 55  BILITOT 0.5  --   --   --  0.9  --   --  0.5 0.4 0.2 0.5  ALBUMIN  2.4*  --   --   --  2.4*  --   --  2.1* 1.9* 2.0* 2.1*  CRP  --   --   --   --   --   --   --  12.2* 6.9* 3.5* 2.0*  PROCALCITON  --   --   --   --  >150.00  --   --  123.54 63.58 41.36 27.81  LATICACIDVEN  --   --  3.3* 4.5* 2.6* 1.7  --   --   --   --   --   INR 1.5*  --   --   --   --   --   --   --   --   --   --   BNP  --   --   --   --  1,610.9*  --   --   --   --   --   --   MG  --    < >  --   --  1.8  --  1.9 1.9 1.8 1.8 1.8  PHOS  --   --   --   --  5.0*  --   --   --   --   --   --   CALCIUM  7.6*  --   --   --  7.6*  --  7.9* 8.3* 8.3* 8.4* 8.2*   < > = values in this interval not displayed.      Recent Labs  Lab 01/11/24 1647 01/11/24 1647 01/11/24 1710 01/11/24 1851 01/12/24 0549 01/12/24 1507 01/13/24 0432 01/14/24 0705 01/15/24 0449 01/16/24 0452 01/17/24 0419  CRP  --   --   --   --   --   --   --  12.2* 6.9* 3.5* 2.0*  PROCALCITON  --   --   --   --  >150.00  --   --  123.54 63.58 41.36 27.81  LATICACIDVEN  --   --  3.3* 4.5* 2.6* 1.7  --   --   --   --   --   INR 1.5*  --   --   --   --   --   --   --   --   --   --   BNP  --   --   --   --   6,045.4*  --   --   --   --   --   --   MG  --    < >  --   --  1.8  --  1.9 1.9 1.8 1.8 1.8  CALCIUM  7.6*  --   --   --  7.6*  --  7.9* 8.3* 8.3* 8.4* 8.2*   < > = values in this interval not displayed.    --------------------------------------------------------------------------------------------------------------- Lab Results  Component Value Date   CHOL 234 (H) 01/27/2021   HDL  40 (L) 01/27/2021   LDLCALC 166 (H) 01/27/2021   TRIG 140 01/27/2021   CHOLHDL 5.9 01/27/2021    Lab Results  Component Value Date   HGBA1C 5.5 01/27/2021   No results for input(s): "TSH", "T4TOTAL", "FREET4", "T3FREE", "THYROIDAB" in the last 72 hours. No results for input(s): "VITAMINB12", "FOLATE", "FERRITIN", "TIBC", "IRON", "RETICCTPCT" in the last 72 hours. ------------------------------------------------------------------------------------------------------------------ Cardiac Enzymes No results for input(s): "CKMB", "TROPONINI", "MYOGLOBIN" in the last 168 hours.  Invalid input(s): "CK"  Micro Results Recent Results (from the past 240 hours)  Blood Culture (routine x 2)     Status: Abnormal   Collection Time: 01/11/24  4:47 PM   Specimen: BLOOD  Result Value Ref Range Status   Specimen Description BLOOD RIGHT ANTECUBITAL  Final   Special Requests   Final    BOTTLES DRAWN AEROBIC AND ANAEROBIC Blood Culture adequate volume   Culture  Setup Time   Final    GRAM NEGATIVE RODS IN BOTH AEROBIC AND ANAEROBIC BOTTLES CRITICAL RESULT CALLED TO, READ BACK BY AND VERIFIED WITH: PHARMD JEREMY FRENS ON 01/12/24 @ 1447 BY DRT Performed at [facility], [address], [place], [place] [address]    Culture ESCHERICHIA COLI (A)  Final   Report Status 01/15/2024 FINAL  Final   Organism ID, Bacteria ESCHERICHIA COLI  Final   Organism ID, Bacteria ESCHERICHIA COLI  Final      Susceptibility   Escherichia coli - KIRBY BAUER*    CEFAZOLIN  SENSITIVE Sensitive    Escherichia coli - MIC*     AMPICILLIN <=2 SENSITIVE Sensitive     CEFEPIME  <=0.12 SENSITIVE Sensitive     CEFTAZIDIME <=1 SENSITIVE Sensitive     CEFTRIAXONE  <=0.25 SENSITIVE Sensitive     CIPROFLOXACIN <=0.25 SENSITIVE Sensitive     GENTAMICIN <=1 SENSITIVE Sensitive     IMIPENEM <=0.25 SENSITIVE Sensitive     TRIMETH/SULFA <=20 SENSITIVE Sensitive     AMPICILLIN/SULBACTAM <=2 SENSITIVE Sensitive     PIP/TAZO <=4 SENSITIVE Sensitive ug/mL    * ESCHERICHIA COLI    ESCHERICHIA COLI  Blood Culture ID Panel (Reflexed)     Status: Abnormal   Collection Time: 01/11/24  4:47 PM  Result Value Ref Range Status   Enterococcus faecalis NOT DETECTED NOT DETECTED Final   Enterococcus Faecium NOT DETECTED NOT DETECTED Final   Listeria monocytogenes NOT DETECTED NOT DETECTED Final   Staphylococcus species NOT DETECTED NOT DETECTED Final   Staphylococcus aureus (BCID) NOT DETECTED NOT DETECTED Final   Staphylococcus epidermidis NOT DETECTED NOT DETECTED Final   Staphylococcus lugdunensis NOT DETECTED NOT DETECTED Final   Streptococcus species NOT DETECTED NOT DETECTED Final   Streptococcus agalactiae NOT DETECTED NOT DETECTED Final   Streptococcus pneumoniae NOT DETECTED NOT DETECTED Final   Streptococcus pyogenes NOT DETECTED NOT DETECTED Final   A.calcoaceticus-baumannii NOT DETECTED NOT DETECTED Final   Bacteroides fragilis NOT DETECTED NOT DETECTED Final   Enterobacterales DETECTED (A) NOT DETECTED Final    Comment: Enterobacterales represent a large order of gram negative bacteria, not a single organism. CRITICAL RESULT CALLED TO, READ BACK BY AND VERIFIED WITH: PHARMD JEREMY FRENS ON 01/12/24 @ 1447 BY DRT    Enterobacter cloacae complex NOT DETECTED NOT DETECTED Final   Escherichia coli DETECTED (A) NOT DETECTED Final    Comment: CRITICAL RESULT CALLED TO, READ BACK BY AND VERIFIED WITH: PHARMD JEREMY FRENS ON 01/12/24 @ 1447 BY DRT    Klebsiella aerogenes NOT DETECTED NOT DETECTED Final   Klebsiella  oxytoca NOT  DETECTED NOT DETECTED Final   Klebsiella pneumoniae NOT DETECTED NOT DETECTED Final   Proteus species NOT DETECTED NOT DETECTED Final   Salmonella species NOT DETECTED NOT DETECTED Final   Serratia marcescens NOT DETECTED NOT DETECTED Final   Haemophilus influenzae NOT DETECTED NOT DETECTED Final   Neisseria meningitidis NOT DETECTED NOT DETECTED Final   Pseudomonas aeruginosa NOT DETECTED NOT DETECTED Final   Stenotrophomonas maltophilia NOT DETECTED NOT DETECTED Final   Candida albicans NOT DETECTED NOT DETECTED Final   Candida auris NOT DETECTED NOT DETECTED Final   Candida glabrata NOT DETECTED NOT DETECTED Final   Candida krusei NOT DETECTED NOT DETECTED Final   Candida parapsilosis NOT DETECTED NOT DETECTED Final   Candida tropicalis NOT DETECTED NOT DETECTED Final   Cryptococcus neoformans/gattii NOT DETECTED NOT DETECTED Final   CTX-M ESBL NOT DETECTED NOT DETECTED Final   Carbapenem resistance IMP NOT DETECTED NOT DETECTED Final   Carbapenem resistance KPC NOT DETECTED NOT DETECTED Final   Carbapenem resistance NDM NOT DETECTED NOT DETECTED Final   Carbapenem resist OXA 48 LIKE NOT DETECTED NOT DETECTED Final   Carbapenem resistance VIM NOT DETECTED NOT DETECTED Final    Comment: Performed at [facility], [address], [place], [place] [address]  Blood Culture (routine x 2)     Status: Abnormal (Preliminary result)   Collection Time: 01/11/24  5:32 PM   Specimen: BLOOD LEFT ARM  Result Value Ref Range Status   Specimen Description BLOOD LEFT ARM  Final   Special Requests   Final    BOTTLES DRAWN AEROBIC AND ANAEROBIC Blood Culture adequate volume   Culture  Setup Time   Final    GRAM NEGATIVE RODS IN BOTH AEROBIC AND ANAEROBIC BOTTLES CRITICAL VALUE NOTED.  VALUE IS CONSISTENT WITH PREVIOUSLY REPORTED AND CALLED VALUE.    Culture (A)  Final    ESCHERICHIA COLI SUSCEPTIBILITIES PERFORMED ON PREVIOUS CULTURE WITHIN THE LAST 5 DAYS. KLEBSIELLA  PNEUMONIAE SUSCEPTIBILITIES TO FOLLOW CRITICAL RESULT CALLED TO, READ BACK BY AND VERIFIED WITH: PHARMD V BRYK [id number] AT 705 AM BY CM Performed at [facility], [address] [place], [place] [address]    Report Status PENDING  Incomplete  Urine Culture     Status: Abnormal   Collection Time: 01/11/24  8:38 PM   Specimen: Urine, Catheterized  Result Value Ref Range Status   Specimen Description URINE, CATHETERIZED  Final   Special Requests NONE Reflexed from [id number]  Final   Culture (A)  Final    <10,000 COLONIES/mL INSIGNIFICANT GROWTH Performed at [facility], [address], [place], [place] [address]    Report Status 01/12/2024 FINAL  Final  Culture, blood (Routine X 2) w Reflex to ID Panel     Status: None (Preliminary result)   Collection Time: 01/16/24 10:53 AM   Specimen: BLOOD  Result Value Ref Range Status   Specimen Description BLOOD SITE NOT SPECIFIED  Final   Special Requests   Final    BOTTLES DRAWN AEROBIC AND ANAEROBIC Blood Culture results may not be optimal due to an inadequate volume of blood received in culture bottles   Culture   Final    NO GROWTH < 24 HOURS Performed at [facility], [address] [place], [place] [address]    Report Status PENDING  Incomplete  Culture, blood (Routine X 2) w Reflex to ID Panel     Status: None (Preliminary result)  Collection Time: 01/16/24 10:53 AM   Specimen: BLOOD  Result Value Ref Range Status   Specimen Description BLOOD SITE NOT SPECIFIED  Final   Special Requests   Final    BOTTLES DRAWN AEROBIC AND ANAEROBIC Blood Culture adequate volume   Culture   Final    NO GROWTH < 24 HOURS Performed at [facility], [address], [place], [place] [address]    Report Status PENDING  Incomplete    Radiology Report DG UGI W SINGLE CM (SOL OR THIN BA) Result Date: 01/16/2024 CLINICAL DATA:  73 year old with epigastric pain. Recent CT raised concern for wall thickening involving the pylorus  and descending duodenum. Concern for a duodenal diverticulum versus ulcer. EXAM: DG UGI W SINGLE CM TECHNIQUE: Scout radiograph was obtained. Single contrast examination was performed using water-soluble contrast. This exam was performed by Elene Griffes, MD. FLUOROSCOPY: Radiation Exposure Index (as provided by the fluoroscopic device): 153.10 mGy Kerma COMPARISON:  CT chest, abdomen pelvis 01/15/2024 FINDINGS: Scout Radiograph: Nonobstructive bowel gas pattern. Gas-filled structure in the region of the duodenal bulb or distal stomach. Levoscoliosis in the lumbar spine. Esophagus: Normal appearance. No mucosal lesions or strictures visualized. Esophageal motility:  Within normal limits. Stomach: Normal appearance. Limited evaluation on this single contrast examination. Gastric emptying: Normal emptying. Duodenum: Normal appearance of the duodenum. There appeared to intermittent dilatation of the duodenal bulb but no focal diverticulum. No gross abnormality to suggest a large ulcer. Contrast moved through the duodenum into the jejunum. No focal narrowing in the duodenum. Other:  None. IMPRESSION: 1. Normal appearance of the duodenum. No evidence for a large duodenal diverticulum. No gross duodenal abnormality. If there is clinical concern in this area, consider further evaluation with endoscopy or CT with IV contrast when the patient's renal function is appropriate. 2. No gross abnormality to the esophagus or stomach. Electronically Signed   By: Elene Griffes M.D.   On: 01/16/2024 15:54     Signature  -   Lynnwood Sauer M.D on 01/17/2024 at 10:19 AM   -  To page go to [email or web address]

## 2024-01-17 NOTE — TOC Progression Note (Signed)
 Transition of Care [facility]) - Progression Note    Patient Details  Name: Isaac Stokes MRN: [id number] Date of Birth: [birth year]  Transition of Care [facility]) CM/SW Contact  Jannice Mends, LCSW Phone Number: 01/17/2024, 4:12 PM  Clinical Narrative:    CSW received update that patient is progressing well with mobility and is hoping to discharge home. CSW met with patient to discuss plan as [facility] has concerns with patient returning home. Patient reported that he would like to return home to [facility] apartments where his friends are. CSW answered questions about [facility] and rehab process, including what insurance pays for and likely need for PTAR to transport since his family is having transportation issues. Patient remarked that his brother is a Optometrist and he would like to speak with him tonight before making his decision. [facility] able to visit patient tomorrow to answer further questions.       Barriers to Discharge: Continued Medical Work up  Expected Discharge Plan and Services In-house Referral: Clinical Social Work     Living arrangements for the past 2 months: Apartment                                       Social Determinants of Health (SDOH) Interventions SDOH Screenings   Food Insecurity: Patient Unable To Answer (01/12/2024)  Housing: Patient Unable To Answer (01/12/2024)  Transportation Needs: Patient Unable To Answer (01/12/2024)  Utilities: Patient Unable To Answer (01/12/2024)  Social Connections: Patient Unable To Answer (01/12/2024)  Tobacco Use: High Risk (01/11/2024)    Readmission Risk Interventions     No data to display

## 2024-01-17 NOTE — Progress Notes (Signed)
 [facility] [phone] [facility]  Hospice hospital liaison note:  Patient revoked hospice benefit in order to seek skilled rehab.   Liaison will continue to follow for outpatient palliative services in facility.   Thank you Dwane Gitelman, BSN, RN Hospice hospital liaison  [phone]

## 2024-01-17 NOTE — Plan of Care (Signed)

## 2024-01-17 NOTE — Progress Notes (Signed)
   01/17/24 0910  Mobility  Activity Ambulated with assistance to bathroom  Level of Assistance Contact guard assist, steadying assist  Assistive Device Front wheel walker  Distance Ambulated (ft) 15 ft  Activity Response Tolerated well  Mobility Referral Yes  Mobility visit 1 Mobility  Mobility Specialist Start Time (ACUTE ONLY) 0910  Mobility Specialist Stop Time (ACUTE ONLY) 0950  Mobility Specialist Time Calculation (min) (ACUTE ONLY) 40 min   Mobility Specialist: Progress Note  Pre-Mobility:      HR 70, SpO2 89% RA Post-Mobility:    HR 59, SpO2 98% RA  Pt agreeable to mobility session - received in chair. C/o pain in groin. Pt with dark green stool, pericare completed ind.  Returned to bed with all needs met - call bell within reach. Bed alarm on.   __________________________________________________________________________  Pre-Mobility:      HR 66, SpO2 91% RA Post-Mobility:    HR 61, SpO2 92% RA  Pt agreeable to mobility session - received in chair. Pt was asymptomatic throughout session with no complaints. Returned to bed with all needs met - call bell within reach. Bed alarm on.  Isla Mari, BS Mobility Specialist Please contact via SecureChat or  Rehab office at [phone].

## 2024-01-17 NOTE — Progress Notes (Signed)
 PHARMACY - PHYSICIAN COMMUNICATION CRITICAL VALUE ALERT - BLOOD CULTURE IDENTIFICATION (BCID)  Isaac Stokes is an 73 y.o. male who presented to [facility] on 01/11/2024 with a chief complaint of weakness and hypotension.  Assessment:  Admitted for sepsis, blood cx initially growing E.coli in 1/4 bottles, now a different bottle from the same set is growing Klebsiella pneumoniae; C/S report due by tomorrow per micro but will not show on BCID panel.  Name of physician (or Provider) Contacted: PSingh MD  Current antibiotics: cefazolin   Changes to prescribed antibiotics recommended:  Recommendations accepted by provider -- change back to ceftriaxone .   Lonnie Roberts, PharmD, BCPS  01/17/2024  7:16 AM

## 2024-01-18 DIAGNOSIS — R933 Abnormal findings on diagnostic imaging of other parts of digestive tract: Secondary | ICD-10-CM | POA: Diagnosis not present

## 2024-01-18 DIAGNOSIS — B961 Klebsiella pneumoniae [K. pneumoniae] as the cause of diseases classified elsewhere: Secondary | ICD-10-CM | POA: Diagnosis not present

## 2024-01-18 DIAGNOSIS — B9620 Unspecified Escherichia coli [E. coli] as the cause of diseases classified elsewhere: Secondary | ICD-10-CM

## 2024-01-18 DIAGNOSIS — A419 Sepsis, unspecified organism: Secondary | ICD-10-CM

## 2024-01-18 DIAGNOSIS — R7881 Bacteremia: Secondary | ICD-10-CM

## 2024-01-18 LAB — CBC WITH DIFFERENTIAL/PLATELET
Abs Immature Granulocytes: 1.17 10*3/uL — ABNORMAL HIGH (ref 0.00–0.07)
Basophils Absolute: 0.1 10*3/uL (ref 0.0–0.1)
Basophils Relative: 1 %
Eosinophils Absolute: 0.5 10*3/uL (ref 0.0–0.5)
Eosinophils Relative: 5 %
HCT: 26 % — ABNORMAL LOW (ref 39.0–52.0)
Hemoglobin: 8.6 g/dL — ABNORMAL LOW (ref 13.0–17.0)
Immature Granulocytes: 11 %
Lymphocytes Relative: 22 %
Lymphs Abs: 2.2 10*3/uL (ref 0.7–4.0)
MCH: 30.6 pg (ref 26.0–34.0)
MCHC: 33.1 g/dL (ref 30.0–36.0)
MCV: 92.5 fL (ref 80.0–100.0)
Monocytes Absolute: 0.9 10*3/uL (ref 0.1–1.0)
Monocytes Relative: 8 %
Neutro Abs: 5.4 10*3/uL (ref 1.7–7.7)
Neutrophils Relative %: 53 %
Platelets: 178 10*3/uL (ref 150–400)
RBC: 2.81 MIL/uL — ABNORMAL LOW (ref 4.22–5.81)
RDW: 15.5 % (ref 11.5–15.5)
Smear Review: NORMAL
WBC: 10.3 10*3/uL (ref 4.0–10.5)
nRBC: 0 % (ref 0.0–0.2)

## 2024-01-18 LAB — COMPREHENSIVE METABOLIC PANEL WITH GFR
ALT: 8 U/L (ref 0–44)
AST: 18 U/L (ref 15–41)
Albumin: 2.1 g/dL — ABNORMAL LOW (ref 3.5–5.0)
Alkaline Phosphatase: 58 U/L (ref 38–126)
Anion gap: 7 (ref 5–15)
BUN: 24 mg/dL — ABNORMAL HIGH (ref 8–23)
CO2: 21 mmol/L — ABNORMAL LOW (ref 22–32)
Calcium: 8.2 mg/dL — ABNORMAL LOW (ref 8.9–10.3)
Chloride: 105 mmol/L (ref 98–111)
Creatinine, Ser: 2 mg/dL — ABNORMAL HIGH (ref 0.61–1.24)
GFR, Estimated: 34 mL/min — ABNORMAL LOW (ref >60)
Glucose, Bld: 81 mg/dL (ref 70–99)
Potassium: 4.4 mmol/L (ref 3.5–5.1)
Sodium: 133 mmol/L — ABNORMAL LOW (ref 135–145)
Total Bilirubin: 0.3 mg/dL (ref 0.0–1.2)
Total Protein: 5.4 g/dL — ABNORMAL LOW (ref 6.5–8.1)

## 2024-01-18 LAB — CULTURE, BLOOD (ROUTINE X 2): Special Requests: ADEQUATE

## 2024-01-18 LAB — C-REACTIVE PROTEIN: CRP: 1.3 mg/dL — ABNORMAL HIGH (ref <1.0)

## 2024-01-18 LAB — PROCALCITONIN: Procalcitonin: 17.32 ng/mL

## 2024-01-18 MED ORDER — LORAZEPAM 0.5 MG PO TABS: 0.5000 mg | ORAL_TABLET | ORAL | 0 refills | Status: AC | PRN

## 2024-01-18 MED ORDER — PANTOPRAZOLE SODIUM 40 MG PO TBEC: 40.0000 mg | DELAYED_RELEASE_TABLET | Freq: Every day | ORAL | Status: AC

## 2024-01-18 MED ORDER — NICOTINE 21 MG/24HR TD PT24: 21.0000 mg | MEDICATED_PATCH | Freq: Every day | TRANSDERMAL | Status: AC

## 2024-01-18 NOTE — TOC Progression Note (Signed)
 Transition of Care [facility]) - Progression Note    Patient Details  Name: Isaac Stokes MRN: [id number] Date of Birth: June 26, 1951  Transition of Care [facility] CM/SW Contact  Jannice Mends, LCSW Phone Number: 01/18/2024, 12:42 PM  Clinical Narrative:    CSW met with patient and neighbor at bedside. Patient requesting to discharge today and is agreeable to SNF. Rhonda with [facility] was able to come by and answer his questions. Will arrange PTAR.      Barriers to Discharge: Barriers Resolved  Expected Discharge Plan and Services In-house Referral: Clinical Social Work     Living arrangements for the past 2 months: Apartment Expected Discharge Date: 01/18/24                                     Social Determinants of Health (SDOH) Interventions SDOH Screenings   Food Insecurity: Patient Unable To Answer (01/12/2024)  Housing: Patient Unable To Answer (01/12/2024)  Transportation Needs: Patient Unable To Answer (01/12/2024)  Utilities: Patient Unable To Answer (01/12/2024)  Social Connections: Patient Unable To Answer (01/12/2024)  Tobacco Use: High Risk (01/11/2024)    Readmission Risk Interventions     No data to display

## 2024-01-18 NOTE — Discharge Instructions (Signed)
 Follow with Primary MD [facility] in 7 days   Get CBC, CMP, 2 view Chest X ray -  checked next visit with your primary MD or SNF MD    Activity: As tolerated with Full fall precautions use walker/cane & assistance as needed  Disposition SNF  Diet: Heart Healthy with 1.5 L total fluid restriction per day  Special Instructions: If you have smoked or chewed Tobacco  in the last 2 yrs please stop smoking, stop any regular Alcohol  and or any Recreational drug use.  On your next visit with your primary care physician please Get Medicines reviewed and adjusted.  Please request your Prim.MD to go over all Hospital Tests and Procedure/Radiological results at the follow up, please get all Hospital records sent to your Prim MD by signing hospital release before you go home.  If you experience worsening of your admission symptoms, develop shortness of breath, life threatening emergency, suicidal or homicidal thoughts you must seek medical attention immediately by calling 911 or calling your MD immediately  if symptoms less severe.  You Must read complete instructions/literature along with all the possible adverse reactions/side effects for all the Medicines you take and that have been prescribed to you. Take any new Medicines after you have completely understood and accpet all the possible adverse reactions/side effects.   Do not drive when taking Pain medications.  Do not take more than prescribed Pain, Sleep and Anxiety Medications  Wear Seat belts while driving.

## 2024-01-18 NOTE — Plan of Care (Signed)
  Problem: Education: Goal: Knowledge of General Education information will improve Description: Including pain rating scale, medication(s)/side effects and non-pharmacologic comfort measures 01/18/2024 1421 by Raynaldo Call, RN Outcome: Adequate for Discharge 01/18/2024 1107 by Raynaldo Call, RN Outcome: Progressing   Problem: Health Behavior/Discharge Planning: Goal: Ability to manage health-related needs will improve 01/18/2024 1421 by Raynaldo Call, RN Outcome: Adequate for Discharge 01/18/2024 1107 by Raynaldo Call, RN Outcome: Progressing   Problem: Clinical Measurements: Goal: Ability to maintain clinical measurements within normal limits will improve 01/18/2024 1421 by Raynaldo Call, RN Outcome: Adequate for Discharge 01/18/2024 1107 by Raynaldo Call, RN Outcome: Progressing Goal: Will remain free from infection 01/18/2024 1421 by Raynaldo Call, RN Outcome: Adequate for Discharge 01/18/2024 1107 by Raynaldo Call, RN Outcome: Progressing Goal: Diagnostic test results will improve 01/18/2024 1421 by Raynaldo Call, RN Outcome: Adequate for Discharge 01/18/2024 1107 by Raynaldo Call, RN Outcome: Progressing Goal: Respiratory complications will improve 01/18/2024 1421 by Raynaldo Call, RN Outcome: Adequate for Discharge 01/18/2024 1107 by Raynaldo Call, RN Outcome: Progressing Goal: Cardiovascular complication will be avoided 01/18/2024 1421 by Raynaldo Call, RN Outcome: Adequate for Discharge 01/18/2024 1107 by Raynaldo Call, RN Outcome: Progressing   Problem: Activity: Goal: Risk for activity intolerance will decrease 01/18/2024 1421 by Raynaldo Call, RN Outcome: Adequate for Discharge 01/18/2024 1107 by Raynaldo Call, RN Outcome: Progressing   Problem: Nutrition: Goal: Adequate nutrition will be maintained 01/18/2024 1421 by Raynaldo Call, RN Outcome: Adequate for Discharge 01/18/2024 1107 by  Raynaldo Call, RN Outcome: Progressing   Problem: Coping: Goal: Level of anxiety will decrease 01/18/2024 1421 by Raynaldo Call, RN Outcome: Adequate for Discharge 01/18/2024 1107 by Raynaldo Call, RN Outcome: Progressing   Problem: Elimination: Goal: Will not experience complications related to bowel motility 01/18/2024 1421 by Raynaldo Call, RN Outcome: Adequate for Discharge 01/18/2024 1107 by Raynaldo Call, RN Outcome: Progressing Goal: Will not experience complications related to urinary retention 01/18/2024 1421 by Raynaldo Call, RN Outcome: Adequate for Discharge 01/18/2024 1107 by Raynaldo Call, RN Outcome: Progressing   Problem: Pain Managment: Goal: General experience of comfort will improve and/or be controlled 01/18/2024 1421 by Raynaldo Call, RN Outcome: Adequate for Discharge 01/18/2024 1107 by Raynaldo Call, RN Outcome: Progressing   Problem: Safety: Goal: Ability to remain free from injury will improve 01/18/2024 1421 by Raynaldo Call, RN Outcome: Adequate for Discharge 01/18/2024 1107 by Raynaldo Call, RN Outcome: Progressing   Problem: Skin Integrity: Goal: Risk for impaired skin integrity will decrease 01/18/2024 1421 by Raynaldo Call, RN Outcome: Adequate for Discharge 01/18/2024 1107 by Raynaldo Call, RN Outcome: Progressing

## 2024-01-18 NOTE — Discharge Summary (Signed)
 Isaac Stokes ZOX:[id number] DOB: [birth year] DOA: 01/11/2024  PCP: [facility]  Admit date: 01/11/2024  Discharge date: 01/18/2024  Admitted From: Home   Disposition:  SNF   Recommendations for Outpatient Follow-up:   Follow up with PCP in 1-2 weeks  PCP Please obtain BMP/CBC, 2 view CXR in 1week,  (see Discharge instructions)   PCP Please follow up on the following pending results:    Home Health: None   Equipment/Devices: None  Consultations: GI, ID Discharge Condition: Stable    CODE STATUS: Full    Diet Recommendation: Heart Healthy 1.5 L fluid restriction per day  Diet Order             Diet Heart Fluid consistency: Thin  Diet effective now                    Chief Complaint  Patient presents with   Hypotension   Weakness     Brief history of present illness from the day of admission and additional interim summary    73 y.o. male with medical history significant for infrarenal abdominal aortic aneurysm, prior CVA, chronic hypotension on midodrine , BPH, chronic HFpEF, current smoker, presented to hospital with generalized weakness decreased oral intake.  Hospice nurse checked on the patient and was noted to be hypotensive so EMS was called in.  Systolic blood pressure was noted to be in the 60s and patient received normal saline bolus and was brought into the hospital.  In the ED code sepsis was activated.  He was found to have E. coli bacteremia with UTI, urine likely being the source.                                                                  Hospital Course   Severe sepsis secondary to UTI, POA E. coli and Klebsiella bacteremia.  Follow urine culture and sensitivity including blood cultures, of note initial blood cultures growing 2 different gram-negative bacteria.   History of BPH on Flomax , stable renal ultrasound, in by ID and GI, finished his IV antibiotic course today.  Per ID no further workup needed no further antibiotics.  Clinically sepsis has resolved.   Infrarenal abdominal aortic aneurysm measuring 6.6 cm now 7 cms Seen by vascular surgery outpatient. Declined surgical intervention, with home hospice.  Discussed with brother, DNR, continue medical treatment.  No heroics.   Nonspecific upper abdominal discomfort.  CT suggestive of possible advanced duodenal ulcer or inflammation, GI consulted may require EGD, GI requested a barium swallow which was unremarkable, per GI no further workup needed.  PPI for now.     AKI on CKD 3B, suspect prerenal in the setting of poor oral intake Presented with BUN 64 and creatinine of 3.84. Baseline creatinine 2.19 with GFR of  31.  Improved after hydration and packed RBC transfusion monitor..   Mild hyponatremia.  SIADH.  Monitor on 1.5 L total fluid restriction per day.     Non-anion gap metabolic acidosis in the setting of lactic acidosis Resolved after hydration   Elevated troponin, suspect demand ischemia in the setting of severe sepsis Troponin 48, repeat 34, non-ACS pattern, likely due to mild demand mismatch from symptomatic anemia.  No chest pain.  No further workup.   Symptomatic anemia- Anemia of chronic disease in the setting of CKD 3B Status post 1 unit of packed RBC.  Stable H&H posttransfusion.  Continue to monitor intermittently at SNF.   Left lower extremity edema and pain, rule out DVT Supportive care much improved, ultrasound negative for DVT.   Thrombocytopenia Platelet count of 73.  Continue to monitor.  Check CBC in AM.   BPH Resume home tamsulosin .   Closely monitor for urinary retention.  Continue intake and output charting.   Chronic HFpEF Euvolemic at this time.  Continue intake and output charting.   Tobacco use disorder Current smoker.  Continue Nicotine  patch   Chronic  hypotension On midodrine  10 mg prior to admission.  Continue midodrine    Prior CVA Resume home regimen Fall precautions    Discharge diagnosis     Principal Problem:   Sepsis [facility]) Active Problems:   Imaging of gastrointestinal tract abnormal    Discharge instructions    Discharge Instructions     Discharge instructions   Complete by: As directed    Follow with Primary MD [facility] in 7 days   Get CBC, CMP, 2 view Chest X ray -  checked next visit with your primary MD or SNF MD    Activity: As tolerated with Full fall precautions use walker/cane & assistance as needed  Disposition SNF  Diet: Heart Healthy with 1.5 L total fluid restriction per day  Special Instructions: If you have smoked or chewed Tobacco  in the last 2 yrs please stop smoking, stop any regular Alcohol  and or any Recreational drug use.  On your next visit with your primary care physician please Get Medicines reviewed and adjusted.  Please request your Prim.MD to go over all Hospital Tests and Procedure/Radiological results at the follow up, please get all Hospital records sent to your Prim MD by signing hospital release before you go home.  If you experience worsening of your admission symptoms, develop shortness of breath, life threatening emergency, suicidal or homicidal thoughts you must seek medical attention immediately by calling 911 or calling your MD immediately  if symptoms less severe.  You Must read complete instructions/literature along with all the possible adverse reactions/side effects for all the Medicines you take and that have been prescribed to you. Take any new Medicines after you have completely understood and accpet all the possible adverse reactions/side effects.   Do not drive when taking Pain medications.  Do not take more than prescribed Pain, Sleep and Anxiety Medications  Wear Seat belts while driving.   Increase activity slowly   Complete by: As  directed        Discharge Medications   Allergies as of 01/18/2024       Reactions   Quinolones    Quinolones contraindicated in patients with aortic aneurysm due to risk of rupture. Patient with 7 cm AAA.        Medication List     STOP taking these medications    meclizine  25 MG tablet  Commonly known as: ANTIVERT    oxyCODONE  5 MG immediate release tablet Commonly known as: Oxy IR/ROXICODONE        TAKE these medications    acetaminophen  500 MG tablet Commonly known as: TYLENOL  Take 1,000 mg by mouth every 6 (six) hours as needed for mild pain (pain score 1-3).   bisacodyl  10 MG suppository Commonly known as: DULCOLAX Place 10 mg rectally as needed for moderate constipation.   escitalopram  5 MG tablet Commonly known as: LEXAPRO  Take 5 mg by mouth daily.   gabapentin 600 MG tablet Commonly known as: NEURONTIN Take 600 mg by mouth 3 (three) times daily.   hydrOXYzine 25 MG tablet Commonly known as: ATARAX Take 25 mg by mouth every 4 (four) hours as needed for anxiety, itching, nausea or vomiting.   LORazepam  0.5 MG tablet Commonly known as: ATIVAN  Take 1 tablet (0.5 mg total) by mouth every 4 (four) hours as needed for anxiety.   Lotrimin AF 1 % cream Generic drug: clotrimazole Apply 1 Application topically daily as needed (groin rash).   midodrine  10 MG tablet Commonly known as: PROAMATINE  Take 10 mg by mouth 2 (two) times daily as needed (hypotension).   nicotine  21 mg/24hr patch Commonly known as: NICODERM CQ  - dosed in mg/24 hours Place 1 patch (21 mg total) onto the skin daily. Start taking on: Jan 19, 2024   pantoprazole  40 MG tablet Commonly known as: PROTONIX  Take 1 tablet (40 mg total) by mouth daily. Start taking on: Jan 19, 2024   polyethylene glycol 17 g packet Commonly known as: MIRALAX  / GLYCOLAX  Take 17 g by mouth daily as needed for mild constipation, moderate constipation or severe constipation.   prochlorperazine  10 MG  tablet Commonly known as: COMPAZINE  Take 10 mg by mouth every 6 (six) hours as needed for nausea or vomiting.   Senna Plus 8.6-50 MG tablet Generic drug: senna-docusate Take 1 tablet by mouth 2 (two) times daily.   tamsulosin  0.4 MG Caps capsule Commonly known as: FLOMAX  Take 0.4 mg by mouth daily.         Contact information for follow-up providers     [facility] Schedule an appointment as soon as possible for a visit in 1 week(s).   Contact information: [address] [place] [address] [phone]              Contact information for after-discharge care     Destination     [facility] Preferred SNF .   Service: Skilled Nursing Contact information: [address] [place]  [phone]                     Major procedures and Radiology Reports - PLEASE review detailed and final reports thoroughly  -       DG UGI W SINGLE CM (SOL OR THIN BA) Result Date: 01/16/2024 CLINICAL DATA:  73 year old with epigastric pain. Recent CT raised concern for wall thickening involving the pylorus and descending duodenum. Concern for a duodenal diverticulum versus ulcer. EXAM: DG UGI W SINGLE CM TECHNIQUE: Scout radiograph was obtained. Single contrast examination was performed using water-soluble contrast. This exam was performed by Elene Griffes, MD. FLUOROSCOPY: Radiation Exposure Index (as provided by the fluoroscopic device): 153.10 mGy Kerma COMPARISON:  CT chest, abdomen pelvis 01/15/2024 FINDINGS: Scout Radiograph: Nonobstructive bowel gas pattern. Gas-filled structure in the region of the duodenal bulb or distal stomach. Levoscoliosis in the lumbar spine. Esophagus: Normal appearance. No mucosal  lesions or strictures visualized. Esophageal motility:  Within normal limits. Stomach: Normal appearance. Limited evaluation on this single contrast examination. Gastric emptying: Normal emptying.  Duodenum: Normal appearance of the duodenum. There appeared to intermittent dilatation of the duodenal bulb but no focal diverticulum. No gross abnormality to suggest a large ulcer. Contrast moved through the duodenum into the jejunum. No focal narrowing in the duodenum. Other:  None. IMPRESSION: 1. Normal appearance of the duodenum. No evidence for a large duodenal diverticulum. No gross duodenal abnormality. If there is clinical concern in this area, consider further evaluation with endoscopy or CT with IV contrast when the patient's renal function is appropriate. 2. No gross abnormality to the esophagus or stomach. Electronically Signed   By: Elene Griffes M.D.   On: 01/16/2024 15:54   CT CHEST ABDOMEN PELVIS WO CONTRAST Result Date: 01/15/2024 CLINICAL DATA:  Sepsis.  Hypotension and weakness. EXAM: CT CHEST, ABDOMEN AND PELVIS WITHOUT CONTRAST TECHNIQUE: Multidetector CT imaging of the chest, abdomen and pelvis was performed following the standard protocol without IV contrast. RADIATION DOSE REDUCTION: This exam was performed according to the departmental dose-optimization program which includes automated exposure control, adjustment of the mA and/or kV according to patient size and/or use of iterative reconstruction technique. COMPARISON:  12/12/2022 FINDINGS: CT CHEST FINDINGS Cardiovascular: The heart size appears normal. Aortic atherosclerosis. Coronary artery calcifications. No pericardial effusion. Mediastinum/Nodes: Thyroid gland, trachea, and esophagus appear normal. No enlarged mediastinal or hilar lymph nodes. Lungs/Pleura: Small bilateral pleural effusions with overlying areas of platelike atelectasis and volume loss in the lower lobes. Emphysema with diffuse bronchial wall thickening. Scattered patchy areas of ground-glass attenuation noted within bilateral upper lobes, and right middle lobe. No consolidative change identified. Thickening along the posterior aspect of the major fissure noted.  Musculoskeletal: No chest wall mass or suspicious bone lesions identified. CT ABDOMEN PELVIS FINDINGS Hepatobiliary: No focal liver abnormality. Gallbladder is decompressed. No bile duct dilatation. Pancreas: Unremarkable. No pancreatic ductal dilatation or surrounding inflammatory changes. Spleen: Normal in size without focal abnormality. Adrenals/Urinary Tract: Normal adrenal glands. No nephrolithiasis or hydronephrosis identified. There is a small amount of gas identified within the bladder. No bladder calculi or focal lesion noted. Stomach/Bowel: Assessment for bowel pathology is limited due to lack of IV contrast material. There is wall thickening involving the pylorus and descending duodenum with surrounding hazy soft tissue edema this is structure within the right upper quadrant of the abdomen adjacent to the proximal duodenum/pylorus has an air-fluid level, image 75/3. This may represent a duodenal diverticulum or ulcer. This measures approximately 4.4 x 4.0 cm, image 74/3. Again this is difficult to further characterize due to lack of IV an oral contrast. The jejunum and remaining small bowel loops have a normal caliber without signs of wall thickening or inflammation. No pathologic dilatation of the colon. Moderate retained stool noted within the ascending colon and transverse colon. Vascular/Lymphatic: Extensive aortic atherosclerosis. Infrarenal abdominal aortic aneurysm is identified. This has a maximum AP dimension of 7.8 cm, image 88/3. On the previous exam using the same measurements scheme as today this had a maximum AP dimension of 7.0 cm. No signs abdominopelvic adenopathy. Reproductive: Prostate is unremarkable. Other: There is a small volume of abdominopelvic ascites. No discrete fluid collections. No signs of pneumoperitoneum. Musculoskeletal: Thoracolumbar scoliosis with multilevel degenerative disc disease. No acute or suspicious osseous findings. IMPRESSION: 1. There is wall thickening  involving the pylorus and descending duodenum with surrounding hazy soft tissue edema. Findings are concerning for with acute  duodenitis. There is a structure within the right upper quadrant of the abdomen adjacent to the proximal duodenum/pylorus has an air-fluid level. This may represent a duodenal diverticulum or ulcer. This measures approximately 4.4 x 4.0 cm. These changes are suboptimally visualized due to lack of IV an oral contrast. Consider further evaluation with upper endoscopy. 2. Small bilateral pleural effusions with overlying areas of platelike atelectasis and volume loss in the lower lobes. 3. Scattered patchy areas of ground-glass attenuation noted within bilateral upper lobes, and right middle lobe. Findings are nonspecific and may reflect atypical infection or inflammation. 4. Small volume of abdominopelvic ascites. 5. Infrarenal abdominal aortic aneurysm is identified. This has a maximum AP dimension of 7.8 cm. On the previous exam using the same measurements scheme as today this had a maximum AP dimension of 7.0 cm. Recommend referral to or continued care with vascular specialist. (Ref.: J Vasc Surg. 2018; 67:2-77 and J Am Coll Radiol 2013;10(10):789-794.) 6. Small amount of gas identified within the bladder. Correlate for any recent instrumentation. 7. Coronary artery calcifications. 8. Aortic Atherosclerosis (ICD10-I70.0) and Emphysema (ICD10-J43.9). Electronically Signed   By: Kimberley Penman M.D.   On: 01/15/2024 08:37   VAS US  LOWER EXTREMITY VENOUS (DVT) Result Date: 01/14/2024  Lower Venous DVT Study Patient Name:  Isaac Stokes  Date of Exam:   01/12/2024 Medical Rec #: [id number]         Accession #:    [id number] Date of Birth: 1950/12/21         Patient Gender: M Patient Age:   65 years Exam Location:  [facility] Procedure:      VAS US  LOWER EXTREMITY VENOUS (DVT) Referring Phys: Kennie Peach HALL --------------------------------------------------------------------------------   Indications: Swelling, Pain, Edema, and left leg.  Comparison Study: 03/28/21 - Negative Performing Technologist: Franky Ivanoff Sturdivant-Jones RDMS, RVT  Examination Guidelines: A complete evaluation includes B-mode imaging, spectral Doppler, color Doppler, and power Doppler as needed of all accessible portions of each vessel. Bilateral testing is considered an integral part of a complete examination. Limited examinations for reoccurring indications may be performed as noted. The reflux portion of the exam is performed with the patient in reverse Trendelenburg.  +---------+---------------+---------+-----------+----------+--------------+ RIGHT    CompressibilityPhasicitySpontaneityPropertiesThrombus Aging +---------+---------------+---------+-----------+----------+--------------+ CFV      Full           Yes      Yes                                 +---------+---------------+---------+-----------+----------+--------------+ SFJ      Full                                                        +---------+---------------+---------+-----------+----------+--------------+ FV Prox  Full                                                        +---------+---------------+---------+-----------+----------+--------------+ FV Mid   Full                                                        +---------+---------------+---------+-----------+----------+--------------+  FV DistalFull                                                        +---------+---------------+---------+-----------+----------+--------------+ PFV      Full                                                        +---------+---------------+---------+-----------+----------+--------------+ POP      Full           Yes      Yes                                 +---------+---------------+---------+-----------+----------+--------------+ PTV      Full                                                         +---------+---------------+---------+-----------+----------+--------------+ PERO     Full                                                        +---------+---------------+---------+-----------+----------+--------------+   +---------+---------------+---------+-----------+----------+--------------+ LEFT     CompressibilityPhasicitySpontaneityPropertiesThrombus Aging +---------+---------------+---------+-----------+----------+--------------+ CFV      Full           Yes      Yes                                 +---------+---------------+---------+-----------+----------+--------------+ SFJ      Full                                                        +---------+---------------+---------+-----------+----------+--------------+ FV Prox  Full                                                        +---------+---------------+---------+-----------+----------+--------------+ FV Mid   Full                                                        +---------+---------------+---------+-----------+----------+--------------+ FV DistalFull                                                        +---------+---------------+---------+-----------+----------+--------------+  PFV      Full                                                        +---------+---------------+---------+-----------+----------+--------------+ POP      Full           Yes      Yes                                 +---------+---------------+---------+-----------+----------+--------------+ PTV      Full                                                        +---------+---------------+---------+-----------+----------+--------------+ PERO     Full                                                        +---------+---------------+---------+-----------+----------+--------------+     Summary: RIGHT: - There is no evidence of deep vein thrombosis in the lower extremity.  - A cystic structure is found  in the popliteal fossa. - Measuring 4.5 x 3.3 x 1.8 cm  LEFT: - There is no evidence of deep vein thrombosis in the lower extremity.  - No cystic structure found in the popliteal fossa.  *See table(s) above for measurements and observations. Electronically signed by Runell Countryman on 01/14/2024 at 9:34:29 AM.    Final    US  RENAL Result Date: 01/13/2024 CLINICAL DATA:  73 year old male with urinary tract infection. EXAM: RENAL / URINARY TRACT ULTRASOUND COMPLETE COMPARISON:  Noncontrast CT Abdomen and Pelvis 12/12/2022. FINDINGS: Right Kidney: Renal measurements: 9.1 x 3.5 x 4.8 cm = volume: 80 mL. Small right kidney with echogenic cortex. But no hydronephrosis or mass. Some maintained corticomedullary differentiation. Left Kidney: Renal measurements: 10.6 x 4.2 x 4.5 cm = volume: 103 mL. No left hydronephrosis or mass. Some echogenic left renal cortex but maintained corticomedullary differentiation. Bladder: Impression from prostatomegaly suspected on the base of the bladder rather than layering debris or mass (series 1, image 24), appears similar to the CT appearance last month. Other: Marked abdominal aortic aneurysm redemonstrated, see details and follow-up recommendation on 12/12/2022. IMPRESSION: 1. Evidence of chronic kidney disease with no acute finding. 2. Known severe abdominal aortic aneurysm, see details and follow-up recommendation on 12/12/2022 CT. Electronically Signed   By: Marlise Simpers M.D.   On: 01/13/2024 11:31   DG Chest Port 1 View Result Date: 01/11/2024 CLINICAL DATA:  Sepsis. EXAM: PORTABLE CHEST 1 VIEW COMPARISON:  Chest CT dated 12/12/2022. FINDINGS: Faint bibasilar interstitial densities may represent atelectasis. Atypical infiltrate is not excluded. No consolidative changes. There is no pleural effusion or pneumothorax. The cardiac silhouette is within normal limits. Atherosclerotic calcification of the aorta. No acute osseous pathology. IMPRESSION: Faint bibasilar atelectasis versus  infiltrate. No consolidative changes. Electronically Signed   By: Angus Bark M.D.   On: 01/11/2024 17:38    Micro Results    Recent Results (  from the past 240 hours)  Blood Culture (routine x 2)     Status: Abnormal   Collection Time: 01/11/24  4:47 PM   Specimen: BLOOD  Result Value Ref Range Status   Specimen Description BLOOD RIGHT ANTECUBITAL  Final   Special Requests   Final    BOTTLES DRAWN AEROBIC AND ANAEROBIC Blood Culture adequate volume   Culture  Setup Time   Final    GRAM NEGATIVE RODS IN BOTH AEROBIC AND ANAEROBIC BOTTLES CRITICAL RESULT CALLED TO, READ BACK BY AND VERIFIED WITH: PHARMD JEREMY FRENS ON 01/12/24 @ 1447 BY DRT Performed at [facility], [address] [place], [place] [address]    Culture ESCHERICHIA COLI (A)  Final   Report Status 01/15/2024 FINAL  Final   Organism ID, Bacteria ESCHERICHIA COLI  Final   Organism ID, Bacteria ESCHERICHIA COLI  Final      Susceptibility   Escherichia coli - KIRBY BAUER*    CEFAZOLIN  SENSITIVE Sensitive    Escherichia coli - MIC*    AMPICILLIN <=2 SENSITIVE Sensitive     CEFEPIME  <=0.12 SENSITIVE Sensitive     CEFTAZIDIME <=1 SENSITIVE Sensitive     CEFTRIAXONE  <=0.25 SENSITIVE Sensitive     CIPROFLOXACIN <=0.25 SENSITIVE Sensitive     GENTAMICIN <=1 SENSITIVE Sensitive     IMIPENEM <=0.25 SENSITIVE Sensitive     TRIMETH/SULFA <=20 SENSITIVE Sensitive     AMPICILLIN/SULBACTAM <=2 SENSITIVE Sensitive     PIP/TAZO <=4 SENSITIVE Sensitive ug/mL    * ESCHERICHIA COLI    ESCHERICHIA COLI  Blood Culture ID Panel (Reflexed)     Status: Abnormal   Collection Time: 01/11/24  4:47 PM  Result Value Ref Range Status   Enterococcus faecalis NOT DETECTED NOT DETECTED Final   Enterococcus Faecium NOT DETECTED NOT DETECTED Final   Listeria monocytogenes NOT DETECTED NOT DETECTED Final   Staphylococcus species NOT DETECTED NOT DETECTED Final   Staphylococcus aureus (BCID) NOT DETECTED NOT DETECTED Final    Staphylococcus epidermidis NOT DETECTED NOT DETECTED Final   Staphylococcus lugdunensis NOT DETECTED NOT DETECTED Final   Streptococcus species NOT DETECTED NOT DETECTED Final   Streptococcus agalactiae NOT DETECTED NOT DETECTED Final   Streptococcus pneumoniae NOT DETECTED NOT DETECTED Final   Streptococcus pyogenes NOT DETECTED NOT DETECTED Final   A.calcoaceticus-baumannii NOT DETECTED NOT DETECTED Final   Bacteroides fragilis NOT DETECTED NOT DETECTED Final   Enterobacterales DETECTED (A) NOT DETECTED Final    Comment: Enterobacterales represent a large order of gram negative bacteria, not a single organism. CRITICAL RESULT CALLED TO, READ BACK BY AND VERIFIED WITH: PHARMD JEREMY FRENS ON 01/12/24 @ 1447 BY DRT    Enterobacter cloacae complex NOT DETECTED NOT DETECTED Final   Escherichia coli DETECTED (A) NOT DETECTED Final    Comment: CRITICAL RESULT CALLED TO, READ BACK BY AND VERIFIED WITH: PHARMD JEREMY FRENS ON 01/12/24 @ 1447 BY DRT    Klebsiella aerogenes NOT DETECTED NOT DETECTED Final   Klebsiella oxytoca NOT DETECTED NOT DETECTED Final   Klebsiella pneumoniae NOT DETECTED NOT DETECTED Final   Proteus species NOT DETECTED NOT DETECTED Final   Salmonella species NOT DETECTED NOT DETECTED Final   Serratia marcescens NOT DETECTED NOT DETECTED Final   Haemophilus influenzae NOT DETECTED NOT DETECTED Final   Neisseria meningitidis NOT DETECTED NOT DETECTED Final   Pseudomonas aeruginosa NOT DETECTED NOT DETECTED Final   Stenotrophomonas maltophilia NOT DETECTED NOT DETECTED Final   Candida albicans NOT DETECTED NOT DETECTED Final  Candida auris NOT DETECTED NOT DETECTED Final   Candida glabrata NOT DETECTED NOT DETECTED Final   Candida krusei NOT DETECTED NOT DETECTED Final   Candida parapsilosis NOT DETECTED NOT DETECTED Final   Candida tropicalis NOT DETECTED NOT DETECTED Final   Cryptococcus neoformans/gattii NOT DETECTED NOT DETECTED Final   CTX-M ESBL NOT DETECTED NOT  DETECTED Final   Carbapenem resistance IMP NOT DETECTED NOT DETECTED Final   Carbapenem resistance KPC NOT DETECTED NOT DETECTED Final   Carbapenem resistance NDM NOT DETECTED NOT DETECTED Final   Carbapenem resist OXA 48 LIKE NOT DETECTED NOT DETECTED Final   Carbapenem resistance VIM NOT DETECTED NOT DETECTED Final    Comment: Performed at [facility], [address] [place], [place] [address]  Blood Culture (routine x 2)     Status: Abnormal   Collection Time: 01/11/24  5:32 PM   Specimen: BLOOD LEFT ARM  Result Value Ref Range Status   Specimen Description BLOOD LEFT ARM  Final   Special Requests   Final    BOTTLES DRAWN AEROBIC AND ANAEROBIC Blood Culture adequate volume   Culture  Setup Time   Final    GRAM NEGATIVE RODS IN BOTH AEROBIC AND ANAEROBIC BOTTLES CRITICAL VALUE NOTED.  VALUE IS CONSISTENT WITH PREVIOUSLY REPORTED AND CALLED VALUE.    Culture (A)  Final    ESCHERICHIA COLI SUSCEPTIBILITIES PERFORMED ON PREVIOUS CULTURE WITHIN THE LAST 5 DAYS. KLEBSIELLA PNEUMONIAE CRITICAL RESULT CALLED TO, READ BACK BY AND VERIFIED WITH: PHARMD V BRYK [id number] AT 705 AM BY CM Performed at [facility], [address], [place], [place] [address]    Report Status 01/18/2024 FINAL  Final   Organism ID, Bacteria KLEBSIELLA PNEUMONIAE  Final   Organism ID, Bacteria KLEBSIELLA PNEUMONIAE  Final      Susceptibility   Klebsiella pneumoniae - KIRBY BAUER*    CEFAZOLIN  SENSITIVE Sensitive    Klebsiella pneumoniae - MIC*    AMPICILLIN RESISTANT Resistant     CEFEPIME  <=0.12 SENSITIVE Sensitive     CEFTAZIDIME <=1 SENSITIVE Sensitive     CEFTRIAXONE  <=0.25 SENSITIVE Sensitive     CIPROFLOXACIN <=0.25 SENSITIVE Sensitive     GENTAMICIN <=1 SENSITIVE Sensitive     IMIPENEM <=0.25 SENSITIVE Sensitive     TRIMETH/SULFA <=20 SENSITIVE Sensitive     AMPICILLIN/SULBACTAM 4 SENSITIVE Sensitive     PIP/TAZO <=4 SENSITIVE Sensitive ug/mL    * KLEBSIELLA PNEUMONIAE    KLEBSIELLA  PNEUMONIAE  Urine Culture     Status: Abnormal   Collection Time: 01/11/24  8:38 PM   Specimen: Urine, Catheterized  Result Value Ref Range Status   Specimen Description URINE, CATHETERIZED  Final   Special Requests NONE Reflexed from [id number]  Final   Culture (A)  Final    <10,000 COLONIES/mL INSIGNIFICANT GROWTH Performed at [facility], [address] [place], [place] [address]    Report Status 01/12/2024 FINAL  Final  Culture, blood (Routine X 2) w Reflex to ID Panel     Status: None (Preliminary result)   Collection Time: 01/16/24 10:53 AM   Specimen: BLOOD  Result Value Ref Range Status   Specimen Description BLOOD SITE NOT SPECIFIED  Final   Special Requests   Final    BOTTLES DRAWN AEROBIC AND ANAEROBIC Blood Culture results may not be optimal due to an inadequate volume of blood received in culture bottles   Culture   Final    NO GROWTH 2 DAYS Performed at  [facility], [address] [place] [place], [place] [address]    Report Status PENDING  Incomplete  Culture, blood (Routine X 2) w Reflex to ID Panel     Status: None (Preliminary result)   Collection Time: 01/16/24 10:53 AM   Specimen: BLOOD  Result Value Ref Range Status   Specimen Description BLOOD SITE NOT SPECIFIED  Final   Special Requests   Final    BOTTLES DRAWN AEROBIC AND ANAEROBIC Blood Culture adequate volume   Culture   Final    NO GROWTH 2 DAYS Performed at [facility], [address], [place], [place] [address]    Report Status PENDING  Incomplete    Today   Subjective    Isaac Stokes today has no headache,no chest abdominal pain,no new weakness tingling or numbness, feels much better wants to go home today.    Objective   Blood pressure 120/80, pulse 71, temperature 98.4 F (36.9 C), temperature source Oral, resp. rate 19, weight 66.6 kg, SpO2 96%.   Intake/Output Summary (Last 24 hours) at 01/18/2024 1121 Last data filed at 01/18/2024 1100 Gross per 24 hour  Intake 540 ml   Output 1750 ml  Net -1210 ml    Exam  Awake Alert, No new F.N deficits,    [place].AT,PERRAL Supple Neck,   Symmetrical Chest wall movement, Good air movement bilaterally, CTAB RRR,No Gallops,   +ve B.Sounds, Abd Soft, Non tender,  No Cyanosis, Clubbing or edema    Data Review   Recent Labs  Lab 01/14/24 0705 01/15/24 0449 01/16/24 0452 01/17/24 0419 01/18/24 0501  WBC 17.3* 9.9 7.2 8.5 10.3  HGB 8.8* 7.7* 8.1* 8.4* 8.6*  HCT 26.3* 23.5* 24.5* 25.3* 26.0*  PLT 115* 95* 103* 149* 178  MCV 91.6 92.2 91.8 92.3 92.5  MCH 30.7 30.2 30.3 30.7 30.6  MCHC 33.5 32.8 33.1 33.2 33.1  RDW 16.1* 15.9* 15.6* 15.5 15.5  LYMPHSABS 1.1 1.7 1.6 1.9 2.2  MONOABS 0.7 0.6 0.8 0.9 0.9  EOSABS 0.2 0.4 0.6* 0.4 0.5  BASOSABS 0.1 0.1 0.1 0.2* 0.1    Recent Labs  Lab 01/11/24 1647 01/11/24 1647 01/11/24 1710 01/11/24 1851 01/12/24 0549 01/12/24 1507 01/13/24 0432 01/14/24 0705 01/15/24 0449 01/16/24 0452 01/17/24 0419 01/18/24 0501  NA 135  --   --   --  136  --  137 139 137 134* 131* 133*  K 5.1  --   --   --  5.0  --  4.3 4.2 4.0 4.1 4.4 4.4  CL 107  --   --   --  110  --  113* 112* 111 106 104 105  CO2 17*  --   --   --  15*  --  16* 19* 19* 20* 22 21*  ANIONGAP 11  --   --   --  11  --  8 8 7 8 5 7   GLUCOSE 80  --   --   --  61*  --  63* 96 76 83 87 81  BUN 64*  --   --   --  64*  --  64* 52* 46* 34* 30* 24*  CREATININE 3.84*  --   --   --  3.34*  --  2.85* 2.43* 2.11* 1.90* 1.83* 2.00*  AST 30  --   --   --  38  --   --  43* 28 21 22 18   ALT 15  --   --   --  19  --   --  37 29 18 12 8   ALKPHOS 42  --   --   --  54  --   --  82 66 57 55 58  BILITOT 0.5  --   --   --  0.9  --   --  0.5 0.4 0.2 0.5 0.3  ALBUMIN  2.4*  --   --   --  2.4*  --   --  2.1* 1.9* 2.0* 2.1* 2.1*  CRP  --   --   --   --   --   --   --  12.2* 6.9* 3.5* 2.0* 1.3*  PROCALCITON  --    < >  --   --  >150.00  --   --  123.54 63.58 41.36 27.81 17.32  LATICACIDVEN  --   --  3.3* 4.5* 2.6* 1.7  --   --   --   --    --   --   INR 1.5*  --   --   --   --   --   --   --   --   --   --   --   BNP  --   --   --   --  1,610.9*  --   --   --   --   --   --   --   MG  --    < >  --   --  1.8  --  1.9 1.9 1.8 1.8 1.8  --   PHOS  --   --   --   --  5.0*  --   --   --   --   --   --   --   CALCIUM  7.6*  --   --   --  7.6*  --  7.9* 8.3* 8.3* 8.4* 8.2* 8.2*   < > = values in this interval not displayed.    Total Time in preparing paper work, data evaluation and todays exam - 35 minutes  Signature  -    [place] Sauer M.D on 01/18/2024 at 11:21 AM   -  To page go to [email or web address]

## 2024-01-18 NOTE — Progress Notes (Signed)
 PROGRESS NOTE                                                                                                                                                                                                             Patient Demographics:    Isaac Stokes, is a 73 y.o. male, DOB - 01/28/1951, ZOX:[id number]  Outpatient Primary MD for the patient is [facility]    LOS - 7  Admit date - 01/11/2024    Chief Complaint  Patient presents with   Hypotension   Weakness       Brief Narrative (HPI from H&P)   73 y.o. male with medical history significant for infrarenal abdominal aortic aneurysm, prior CVA, chronic hypotension on midodrine , BPH, chronic HFpEF, current smoker, presented to hospital with generalized weakness decreased oral intake.  Hospice nurse checked on the patient and was noted to be hypotensive so EMS was called in.  Systolic blood pressure was noted to be in the 60s and patient received normal saline bolus and was brought into the hospital.  In the ED code sepsis was activated.  He was found to have E. coli bacteremia with UTI, urine likely being the source.   Subjective:   Patient in bed, appears comfortable, denies any headache, no fever, no chest pain or pressure, no shortness of breath , no abdominal pain. No new focal weakness.   Assessment  & Plan :    Severe sepsis secondary to UTI, POA E. coli and Klebsiella bacteremia.  Follow urine culture and sensitivity including blood cultures, of note initial blood cultures growing 2 different gram-negative bacteria.  History of BPH on Flomax , stable renal ultrasound, with empiric IV Rocephin  improving, continue to monitor, sepsis pathophysiology much improved.  Repeat blood cultures on 01/16/2024 so far negative, UC inconclusive.  Appreciate GI and ID input, ID to clarify antibiotics course and duration.   Infrarenal abdominal aortic aneurysm  measuring 6.6 cm now 7 cms Seen by vascular surgery outpatient. Declined surgical intervention, with home hospice.  Discussed with brother, DNR, continue medical treatment.  No heroics.   Nonspecific upper abdominal discomfort.  CT suggestive of possible advanced duodenal ulcer or inflammation, GI consulted may require EGD, GI requested a barium swallow which was unremarkable, per GI no further workup needed.  PPI for now.    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 Baseline creatinine 2.19 with GFR of 31.  Proved after hydration and packed RBC transfusion monitor..   Mild hyponatremia.  SIADH.  Monitor.    Non-anion gap metabolic acidosis in the setting of lactic acidosis Received IV fluids and antibiotics.  Will discontinue IV fluids.  Encourage oral hydration.   Elevated troponin, suspect demand ischemia in the setting of severe sepsis Troponin 48, repeat 34, non-ACS pattern, likely due to mild demand mismatch from symptomatic anemia.  No chest pain.  No further workup.   Symptomatic anemia- Anemia of chronic disease in the setting of CKD 3B Status post 1 unit of packed RBC.  Hemoglobin today at 7.8.  Continue to monitor no signs of ongoing bleeding.   Left lower extremity edema and pain, rule out DVT Supportive care much improved, ultrasound negative for DVT.   Thrombocytopenia Platelet count of 73.  Continue to monitor.  Check CBC in AM.   BPH Resume home tamsulosin .   Closely monitor for urinary retention.  Continue intake and output charting.   Chronic HFpEF Euvolemic at this time.  Continue intake and output charting.   Tobacco use disorder Current smoker.  Continue Nicotine  patch   Chronic hypotension On midodrine  10 mg prior to admission.  Continue midodrine    Prior CVA Resume home regimen Fall precautions        Condition - Extremely Guarded  Family Communication  : Discussed with brother  [phone]  over the phone on 05/15/2024, DNR, continue gentle medical treatment, may be open to SNF with palliative care  Code Status :  DNR  Consults  :  GI, ID  PUD Prophylaxis : PPI   Procedures  :     Barium swallow  01/16/2024.  Non acute  CT chest -abdominal pelvis.    1. There is wall thickening involving the pylorus and descending duodenum with surrounding hazy soft tissue edema. Findings are concerning for with acute duodenitis. There is a structure within the right upper quadrant of the abdomen adjacent to the proximal duodenum/pylorus has an air-fluid level. This may represent a duodenal diverticulum or ulcer. This measures approximately 4.4 x 4.0 cm. These changes are suboptimally visualized due to lack of IV an oral contrast. Consider further evaluation with upper endoscopy. 2. Small bilateral pleural effusions with overlying areas of platelike atelectasis and volume loss in the lower lobes. 3. Scattered patchy areas of ground-glass attenuation noted within bilateral upper lobes, and right middle lobe. Findings are nonspecific and may reflect atypical infection or inflammation. 4. Small volume of abdominopelvic ascites. 5. Infrarenal abdominal aortic aneurysm is identified. This has a maximum AP dimension of 7.8 cm. On the previous exam using the same measurements scheme as today this had a maximum AP dimension of 7.0 cm. Recommend referral to or continued care with vascular specialist. (Ref.: J Vasc Surg. 2018; 67:2-77 and J Am Coll Radiol 2013;10(10):789-794.) 6. Small amount of gas identified within the bladder. Correlate for any recent instrumentation. 7. Coronary artery calcifications. 8. Aortic Atherosclerosis (ICD10-I70.0) and Emphysema (ICD10-J43.9).  Renal US  - Non acute  Bilateral lower extremity venous duplex.  No DVT.      Disposition Plan  :    Status is: Inpatient  DVT Prophylaxis  :  Heparin     Lab Results  Component Value Date   PLT 178 01/18/2024    Diet  :  Diet Order  Diet Heart Fluid consistency: Thin  Diet effective now                    Inpatient Medications  Scheduled Meds:  sodium chloride    Intravenous Once   escitalopram   5 mg Oral Daily   heparin  injection (subcutaneous)  5,000 Units Subcutaneous Q8H   midodrine   5 mg Oral TID WC   nicotine   7 mg Transdermal Daily   pantoprazole   40 mg Oral Daily   tamsulosin   0.4 mg Oral Daily   Continuous Infusions:  cefTRIAXone  (ROCEPHIN )  IV 2 g (01/17/24 0905)   PRN Meds:.acetaminophen , alum & mag hydroxide-simeth, bisacodyl , ipratropium-albuterol , LORazepam , melatonin, polyethylene glycol, prochlorperazine     Objective:   Vitals:   01/18/24 0000 01/18/24 0400 01/18/24 0500 01/18/24 0723  BP: (!) 131/92 132/86  120/80  Pulse: 60 61  71  Resp: 12 14  19   Temp:  98.2 F (36.8 C)  98.4 F (36.9 C)  TempSrc:    Oral  SpO2: 96% 96%  96%  Weight:   66.6 kg     Wt Readings from Last 3 Encounters:  01/18/24 66.6 kg  11/06/23 63 kg  12/12/22 63 kg     Intake/Output Summary (Last 24 hours) at 01/18/2024 0934 Last data filed at 01/18/2024 0723 Gross per 24 hour  Intake 440 ml  Output 1400 ml  Net -960 ml     Physical Exam  Awake mildly confused, No new F.N deficits, Normal affect [place].AT,PERRAL Supple Neck, No JVD,   Symmetrical Chest wall movement, Good air movement bilaterally, CTAB RRR,No Gallops,Rubs or new Murmurs,  +ve B.Sounds, Abd Soft, No tenderness,   No Cyanosis, Clubbing or edema       Data Review:    Recent Labs  Lab 01/14/24 0705 01/15/24 0449 01/16/24 0452 01/17/24 0419 01/18/24 0501  WBC 17.3* 9.9 7.2 8.5 10.3  HGB 8.8* 7.7* 8.1* 8.4* 8.6*  HCT 26.3* 23.5* 24.5* 25.3* 26.0*  PLT 115* 95* 103* 149* 178  MCV 91.6 92.2 91.8 92.3 92.5  MCH 30.7 30.2 30.3 30.7 30.6  MCHC 33.5 32.8 33.1 33.2 33.1  RDW 16.1* 15.9* 15.6* 15.5 15.5  LYMPHSABS 1.1 1.7 1.6 1.9 2.2  MONOABS 0.7 0.6 0.8 0.9 0.9  EOSABS 0.2 0.4 0.6* 0.4 0.5   BASOSABS 0.1 0.1 0.1 0.2* 0.1    Recent Labs  Lab 01/11/24 1647 01/11/24 1647 01/11/24 1710 01/11/24 1851 01/12/24 0549 01/12/24 1507 01/13/24 0432 01/14/24 0705 01/15/24 0449 01/16/24 0452 01/17/24 0419 01/18/24 0501  NA 135  --   --   --  136  --  137 139 137 134* 131* 133*  K 5.1  --   --   --  5.0  --  4.3 4.2 4.0 4.1 4.4 4.4  CL 107  --   --   --  110  --  113* 112* 111 106 104 105  CO2 17*  --   --   --  15*  --  16* 19* 19* 20* 22 21*  ANIONGAP 11  --   --   --  11  --  8 8 7 8 5 7   GLUCOSE 80  --   --   --  61*  --  63* 96 76 83 87 81  BUN 64*  --   --   --  64*  --  64* 52* 46* 34* 30* 24*  CREATININE 3.84*  --   --   --  3.34*  --  2.85* 2.43* 2.11* 1.90* 1.83* 2.00*  AST 30  --   --   --  38  --   --  43* 28 21 22 18   ALT 15  --   --   --  19  --   --  37 29 18 12 8   ALKPHOS 42  --   --   --  54  --   --  82 66 57 55 58  BILITOT 0.5  --   --   --  0.9  --   --  0.5 0.4 0.2 0.5 0.3  ALBUMIN  2.4*  --   --   --  2.4*  --   --  2.1* 1.9* 2.0* 2.1* 2.1*  CRP  --   --   --   --   --   --   --  12.2* 6.9* 3.5* 2.0* 1.3*  PROCALCITON  --    < >  --   --  >150.00  --   --  123.54 63.58 41.36 27.81 17.32  LATICACIDVEN  --   --  3.3* 4.5* 2.6* 1.7  --   --   --   --   --   --   INR 1.5*  --   --   --   --   --   --   --   --   --   --   --   BNP  --   --   --   --  9,604.5*  --   --   --   --   --   --   --   MG  --    < >  --   --  1.8  --  1.9 1.9 1.8 1.8 1.8  --   PHOS  --   --   --   --  5.0*  --   --   --   --   --   --   --   CALCIUM  7.6*  --   --   --  7.6*  --  7.9* 8.3* 8.3* 8.4* 8.2* 8.2*   < > = values in this interval not displayed.      Recent Labs  Lab 01/11/24 1647 01/11/24 1647 01/11/24 1710 01/11/24 1851 01/12/24 0549 01/12/24 1507 01/13/24 0432 01/14/24 0705 01/15/24 0449 01/16/24 0452 01/17/24 0419 01/18/24 0501  CRP  --   --   --   --   --   --   --  12.2* 6.9* 3.5* 2.0* 1.3*  PROCALCITON  --    < >  --   --  >150.00  --   --  123.54  63.58 41.36 27.81 17.32  LATICACIDVEN  --   --  3.3* 4.5* 2.6* 1.7  --   --   --   --   --   --   INR 1.5*  --   --   --   --   --   --   --   --   --   --   --   BNP  --   --   --   --  4,098.1*  --   --   --   --   --   --   --   MG  --    < >  --   --  1.8  --  1.9 1.9 1.8 1.8 1.8  --   CALCIUM  7.6*  --   --   --  7.6*  --  7.9* 8.3* 8.3* 8.4* 8.2* 8.2*   < > = values in this interval not displayed.    --------------------------------------------------------------------------------------------------------------- Lab Results  Component Value Date   CHOL 234 (H) 01/27/2021   HDL 40 (L) 01/27/2021   LDLCALC 166 (H) 01/27/2021   TRIG 140 01/27/2021   CHOLHDL 5.9 01/27/2021    Lab Results  Component Value Date   HGBA1C 5.5 01/27/2021   No results for input(s): "TSH", "T4TOTAL", "FREET4", "T3FREE", "THYROIDAB" in the last 72 hours. No results for input(s): "VITAMINB12", "FOLATE", "FERRITIN", "TIBC", "IRON", "RETICCTPCT" in the last 72 hours.  Radiology Report DG UGI W SINGLE CM (SOL OR THIN BA) Result Date: 01/16/2024 CLINICAL DATA:  73 year old with epigastric pain. Recent CT raised concern for wall thickening involving the pylorus and descending duodenum. Concern for a duodenal diverticulum versus ulcer. EXAM: DG UGI W SINGLE CM TECHNIQUE: Scout radiograph was obtained. Single contrast examination was performed using water-soluble contrast. This exam was performed by Elene Griffes, MD. FLUOROSCOPY: Radiation Exposure Index (as provided by the fluoroscopic device): 153.10 mGy Kerma COMPARISON:  CT chest, abdomen pelvis 01/15/2024 FINDINGS: Scout Radiograph: Nonobstructive bowel gas pattern. Gas-filled structure in the region of the duodenal bulb or distal stomach. Levoscoliosis in the lumbar spine. Esophagus: Normal appearance. No mucosal lesions or strictures visualized. Esophageal motility:  Within normal limits. Stomach: Normal appearance. Limited evaluation on this single contrast  examination. Gastric emptying: Normal emptying. Duodenum: Normal appearance of the duodenum. There appeared to intermittent dilatation of the duodenal bulb but no focal diverticulum. No gross abnormality to suggest a large ulcer. Contrast moved through the duodenum into the jejunum. No focal narrowing in the duodenum. Other:  None. IMPRESSION: 1. Normal appearance of the duodenum. No evidence for a large duodenal diverticulum. No gross duodenal abnormality. If there is clinical concern in this area, consider further evaluation with endoscopy or CT with IV contrast when the patient's renal function is appropriate. 2. No gross abnormality to the esophagus or stomach. Electronically Signed   By: Elene Griffes M.D.   On: 01/16/2024 15:54     Signature  -   [place] Sauer M.D on 01/18/2024 at 9:34 AM   -  To page go to [email or web address]

## 2024-01-18 NOTE — Plan of Care (Signed)

## 2024-01-18 NOTE — Progress Notes (Signed)
 [facility]  Date of Admission:  01/11/2024      Total days of antibiotics 7   Ceftriaxone           ASSESSMENT: Isaac Stokes is a 73 y.o. male admitted with generalized weakness, decreased oral intake, hypotension; found to have:   Polymicrobial Bacteremia -  Chronic Foley Catheter w/ Recent Trauma - Klebsiella and E Coli -  Improved clinically on ceftriaxone  and now 7 days into treatment which would be sufficient given GU source. Leukocytosis resolved. Never had fevers. Recommend stopping antibiotics   AAA 7 cm -  Avoid quinolones   Disposition -  Planning discharge back to [facility]   ID will sign off - please call back with any questions/concerns or if we can be of further assistance.   PLAN: Stop ceftriaxone  (7d sufficient for GU associated bacteremia)  FU with Hospice / [facility] team   ID will sign off - please call back with any questions/concerns or if we can be of further assistance.    Principal Problem:   Sepsis (HCC) Active Problems:   Imaging of gastrointestinal tract abnormal    sodium chloride    Intravenous Once   escitalopram   5 mg Oral Daily   heparin  injection (subcutaneous)  5,000 Units Subcutaneous Q8H   midodrine   5 mg Oral TID WC   nicotine   7 mg Transdermal Daily   pantoprazole   40 mg Oral Daily   tamsulosin   0.4 mg Oral Daily    SUBJECTIVE: Feeling better. Very anxious to leave the hospital. Could not believe that he may have had bacterial infection from his catheter.   Review of Systems: Review of Systems  Constitutional:  Negative for chills and fever.  Gastrointestinal:  Negative for abdominal pain, nausea and vomiting.  Genitourinary:  Negative for dysuria and urgency.    Allergies  Allergen Reactions   Quinolones     Quinolones contraindicated in patients with aortic aneurysm due to risk of rupture. Patient with 7 cm AAA.    OBJECTIVE: Vitals:   01/18/24 0500  01/18/24 0723 01/18/24 1151 01/18/24 1214  BP:  120/80  123/80  Pulse:  71  66  Resp:  19  19  Temp:  98.4 F (36.9 C)  98.1 F (36.7 C)  TempSrc:  Oral  Oral  SpO2:  96%  97%  Weight: 66.6 kg     Height:   6' (1.829 m)    Body mass index is 19.91 kg/m.  Physical Exam Constitutional:      Appearance: Normal appearance. He is not ill-appearing.  Cardiovascular:     Rate and Rhythm: Normal rate.  Pulmonary:     Effort: Pulmonary effort is normal.  Skin:    General: Skin is warm and dry.     Findings: Bruising present.  Neurological:     Mental Status: He is alert.     Lab Results Lab Results  Component Value Date   WBC 10.3 01/18/2024   HGB 8.6 (L) 01/18/2024   HCT 26.0 (L) 01/18/2024   MCV 92.5 01/18/2024   PLT 178 01/18/2024    Lab Results  Component Value Date   CREATININE 2.00 (H) 01/18/2024   BUN 24 (H) 01/18/2024   NA 133 (L) 01/18/2024   K 4.4 01/18/2024   CL 105 01/18/2024   CO2 21 (L) 01/18/2024    Lab Results  Component Value Date   ALT 8 01/18/2024   AST  18 01/18/2024   ALKPHOS 58 01/18/2024   BILITOT 0.3 01/18/2024     Microbiology: Recent Results (from the past 240 hours)  Blood Culture (routine x 2)     Status: Abnormal   Collection Time: 01/11/24  4:47 PM   Specimen: BLOOD  Result Value Ref Range Status   Specimen Description BLOOD RIGHT ANTECUBITAL  Final   Special Requests   Final    BOTTLES DRAWN AEROBIC AND ANAEROBIC Blood Culture adequate volume   Culture  Setup Time   Final    GRAM NEGATIVE RODS IN BOTH AEROBIC AND ANAEROBIC BOTTLES CRITICAL RESULT CALLED TO, READ BACK BY AND VERIFIED WITH: PHARMD JEREMY FRENS ON 01/12/24 @ 1447 BY DRT Performed at [facility], [address], [place], [place] [address]    Culture ESCHERICHIA COLI (A)  Final   Report Status 01/15/2024 FINAL  Final   Organism ID, Bacteria ESCHERICHIA COLI  Final   Organism ID, Bacteria ESCHERICHIA COLI  Final      Susceptibility   Escherichia coli -  KIRBY BAUER*    CEFAZOLIN  SENSITIVE Sensitive    Escherichia coli - MIC*    AMPICILLIN <=2 SENSITIVE Sensitive     CEFEPIME  <=0.12 SENSITIVE Sensitive     CEFTAZIDIME <=1 SENSITIVE Sensitive     CEFTRIAXONE  <=0.25 SENSITIVE Sensitive     CIPROFLOXACIN <=0.25 SENSITIVE Sensitive     GENTAMICIN <=1 SENSITIVE Sensitive     IMIPENEM <=0.25 SENSITIVE Sensitive     TRIMETH/SULFA <=20 SENSITIVE Sensitive     AMPICILLIN/SULBACTAM <=2 SENSITIVE Sensitive     PIP/TAZO <=4 SENSITIVE Sensitive ug/mL    * ESCHERICHIA COLI    ESCHERICHIA COLI  Blood Culture ID Panel (Reflexed)     Status: Abnormal   Collection Time: 01/11/24  4:47 PM  Result Value Ref Range Status   Enterococcus faecalis NOT DETECTED NOT DETECTED Final   Enterococcus Faecium NOT DETECTED NOT DETECTED Final   Listeria monocytogenes NOT DETECTED NOT DETECTED Final   Staphylococcus species NOT DETECTED NOT DETECTED Final   Staphylococcus aureus (BCID) NOT DETECTED NOT DETECTED Final   Staphylococcus epidermidis NOT DETECTED NOT DETECTED Final   Staphylococcus lugdunensis NOT DETECTED NOT DETECTED Final   Streptococcus species NOT DETECTED NOT DETECTED Final   Streptococcus agalactiae NOT DETECTED NOT DETECTED Final   Streptococcus pneumoniae NOT DETECTED NOT DETECTED Final   Streptococcus pyogenes NOT DETECTED NOT DETECTED Final   A.calcoaceticus-baumannii NOT DETECTED NOT DETECTED Final   Bacteroides fragilis NOT DETECTED NOT DETECTED Final   Enterobacterales DETECTED (A) NOT DETECTED Final    Comment: Enterobacterales represent a large order of gram negative bacteria, not a single organism. CRITICAL RESULT CALLED TO, READ BACK BY AND VERIFIED WITH: PHARMD JEREMY FRENS ON 01/12/24 @ 1447 BY DRT    Enterobacter cloacae complex NOT DETECTED NOT DETECTED Final   Escherichia coli DETECTED (A) NOT DETECTED Final    Comment: CRITICAL RESULT CALLED TO, READ BACK BY AND VERIFIED WITH: PHARMD JEREMY FRENS ON 01/12/24 @ 1447 BY DRT     Klebsiella aerogenes NOT DETECTED NOT DETECTED Final   Klebsiella oxytoca NOT DETECTED NOT DETECTED Final   Klebsiella pneumoniae NOT DETECTED NOT DETECTED Final   Proteus species NOT DETECTED NOT DETECTED Final   Salmonella species NOT DETECTED NOT DETECTED Final   Serratia marcescens NOT DETECTED NOT DETECTED Final   Haemophilus influenzae NOT DETECTED NOT DETECTED Final   Neisseria meningitidis NOT DETECTED NOT DETECTED Final   Pseudomonas aeruginosa NOT DETECTED NOT DETECTED  Final   Stenotrophomonas maltophilia NOT DETECTED NOT DETECTED Final   Candida albicans NOT DETECTED NOT DETECTED Final   Candida auris NOT DETECTED NOT DETECTED Final   Candida glabrata NOT DETECTED NOT DETECTED Final   Candida krusei NOT DETECTED NOT DETECTED Final   Candida parapsilosis NOT DETECTED NOT DETECTED Final   Candida tropicalis NOT DETECTED NOT DETECTED Final   Cryptococcus neoformans/gattii NOT DETECTED NOT DETECTED Final   CTX-M ESBL NOT DETECTED NOT DETECTED Final   Carbapenem resistance IMP NOT DETECTED NOT DETECTED Final   Carbapenem resistance KPC NOT DETECTED NOT DETECTED Final   Carbapenem resistance NDM NOT DETECTED NOT DETECTED Final   Carbapenem resist OXA 48 LIKE NOT DETECTED NOT DETECTED Final   Carbapenem resistance VIM NOT DETECTED NOT DETECTED Final    Comment: Performed at [facility], [address], [place], [place] [address]  Blood Culture (routine x 2)     Status: Abnormal   Collection Time: 01/11/24  5:32 PM   Specimen: BLOOD LEFT ARM  Result Value Ref Range Status   Specimen Description BLOOD LEFT ARM  Final   Special Requests   Final    BOTTLES DRAWN AEROBIC AND ANAEROBIC Blood Culture adequate volume   Culture  Setup Time   Final    GRAM NEGATIVE RODS IN BOTH AEROBIC AND ANAEROBIC BOTTLES CRITICAL VALUE NOTED.  VALUE IS CONSISTENT WITH PREVIOUSLY REPORTED AND CALLED VALUE.    Culture (A)  Final    ESCHERICHIA COLI SUSCEPTIBILITIES PERFORMED ON PREVIOUS  CULTURE WITHIN THE LAST 5 DAYS. KLEBSIELLA PNEUMONIAE CRITICAL RESULT CALLED TO, READ BACK BY AND VERIFIED WITH: PHARMD V BRYK [id number] AT 705 AM BY CM Performed at [facility], [address], [place], [place] [address]    Report Status 01/18/2024 FINAL  Final   Organism ID, Bacteria KLEBSIELLA PNEUMONIAE  Final   Organism ID, Bacteria KLEBSIELLA PNEUMONIAE  Final      Susceptibility   Klebsiella pneumoniae - KIRBY BAUER*    CEFAZOLIN  SENSITIVE Sensitive    Klebsiella pneumoniae - MIC*    AMPICILLIN RESISTANT Resistant     CEFEPIME  <=0.12 SENSITIVE Sensitive     CEFTAZIDIME <=1 SENSITIVE Sensitive     CEFTRIAXONE  <=0.25 SENSITIVE Sensitive     CIPROFLOXACIN <=0.25 SENSITIVE Sensitive     GENTAMICIN <=1 SENSITIVE Sensitive     IMIPENEM <=0.25 SENSITIVE Sensitive     TRIMETH/SULFA <=20 SENSITIVE Sensitive     AMPICILLIN/SULBACTAM 4 SENSITIVE Sensitive     PIP/TAZO <=4 SENSITIVE Sensitive ug/mL    * KLEBSIELLA PNEUMONIAE    KLEBSIELLA PNEUMONIAE  Urine Culture     Status: Abnormal   Collection Time: 01/11/24  8:38 PM   Specimen: Urine, Catheterized  Result Value Ref Range Status   Specimen Description URINE, CATHETERIZED  Final   Special Requests NONE Reflexed from [id number]  Final   Culture (A)  Final    <10,000 COLONIES/mL INSIGNIFICANT GROWTH Performed at [facility], [address], [place], [place] [address]    Report Status 01/12/2024 FINAL  Final  Culture, blood (Routine X 2) w Reflex to ID Panel     Status: None (Preliminary result)   Collection Time: 01/16/24 10:53 AM   Specimen: BLOOD  Result Value Ref Range Status   Specimen Description BLOOD SITE NOT SPECIFIED  Final   Special Requests   Final    BOTTLES DRAWN AEROBIC AND ANAEROBIC Blood Culture results may not be optimal due to an inadequate volume of  blood received in culture bottles   Culture   Final    NO GROWTH 2 DAYS Performed at [facility], [address], [place], [place] [address]     Report Status PENDING  Incomplete  Culture, blood (Routine X 2) w Reflex to ID Panel     Status: None (Preliminary result)   Collection Time: 01/16/24 10:53 AM   Specimen: BLOOD  Result Value Ref Range Status   Specimen Description BLOOD SITE NOT SPECIFIED  Final   Special Requests   Final    BOTTLES DRAWN AEROBIC AND ANAEROBIC Blood Culture adequate volume   Culture   Final    NO GROWTH 2 DAYS Performed at [facility], [address], [place], [place] [address]    Report Status PENDING  Incomplete     Gibson Kurtz, MSN, NP-C [facility]  [email or web address] Pager: [phone] Office: [phone] [facility] Main Line: [phone] *Secure Chat Communication Welcome

## 2024-01-18 NOTE — TOC Transition Note (Signed)
 Transition of Care [facility]) - Discharge Note   Patient Details  Name: Isaac Stokes MRN: [id number] Date of Birth: 10/30/1950  [facility] CM/SW Contact:  Jannice Mends, LCSW Phone Number: 01/18/2024, 12:40 PM   Clinical Narrative:    Patient will DC to: [facility] Anticipated DC date: 01/18/24 Family notified: Brother and nephew, Royston Cornea Transport by: Lyna Sandhoff    Per MD patient ready for DC to [facility]. RN to call report prior to discharge [phone] (press option 0, room 301)). RN, patient, patient's family, and facility notified of DC. Discharge Summary and FL2 sent to facility. DC packet on chart including signed script and DNR. Ambulance transport requested for patient.   CSW will sign off for now as social work intervention is no longer needed. Please consult us  again if new needs arise.     Final next level of care: Skilled Nursing Facility Barriers to Discharge: Barriers Resolved   Patient Goals and CMS Choice Patient states their goals for this hospitalization and ongoing recovery are:: Rehab CMS Medicare.gov Compare Post Acute Care list provided to:: Patient Choice offered to / list presented to : Patient, Sibling Sylvester ownership interest in [facility].provided to:: Patient    Discharge Placement   Existing PASRR number confirmed : 01/18/24          Patient chooses bed at: [facility] Patient to be transferred to facility by: [facility] Name of family member notified: Brother Patient and family notified of of transfer: 01/18/24  Discharge Plan and Services Additional resources added to the After Visit Summary for   In-house Referral: Clinical Social Work                                   Social Drivers of Health (SDOH) Interventions SDOH Screenings   Food Insecurity: Patient Unable To Answer (01/12/2024)  Housing: Patient Unable To Answer (01/12/2024)  Transportation Needs: Patient Unable To Answer  (01/12/2024)  Utilities: Patient Unable To Answer (01/12/2024)  Social Connections: Patient Unable To Answer (01/12/2024)  Tobacco Use: High Risk (01/11/2024)     Readmission Risk Interventions     No data to display

## 2024-01-21 LAB — CULTURE, BLOOD (ROUTINE X 2)
Culture: NO GROWTH
Culture: NO GROWTH
Special Requests: ADEQUATE

## 2024-02-06 ENCOUNTER — Ambulatory Visit: Admitting: Podiatry
